# Patient Record
Sex: Male | Born: 1935
Health system: Southern US, Community
[De-identification: ages and names within clinical notes are randomized; demographics above are authoritative.]

## PROBLEM LIST (undated history)

## (undated) DIAGNOSIS — T7840XA Allergy, unspecified, initial encounter: Secondary | ICD-10-CM

## (undated) DIAGNOSIS — M1990 Unspecified osteoarthritis, unspecified site: Secondary | ICD-10-CM

## (undated) DIAGNOSIS — N400 Enlarged prostate without lower urinary tract symptoms: Secondary | ICD-10-CM

## (undated) DIAGNOSIS — I82409 Acute embolism and thrombosis of unspecified deep veins of unspecified lower extremity: Secondary | ICD-10-CM

## (undated) DIAGNOSIS — I10 Essential (primary) hypertension: Secondary | ICD-10-CM

## (undated) DIAGNOSIS — G709 Myoneural disorder, unspecified: Secondary | ICD-10-CM

## (undated) DIAGNOSIS — N529 Male erectile dysfunction, unspecified: Secondary | ICD-10-CM

## (undated) DIAGNOSIS — IMO0001 Reserved for inherently not codable concepts without codable children: Secondary | ICD-10-CM

## (undated) DIAGNOSIS — Z860100 Personal history of colon polyps, unspecified: Secondary | ICD-10-CM

## (undated) DIAGNOSIS — E213 Hyperparathyroidism, unspecified: Secondary | ICD-10-CM

## (undated) DIAGNOSIS — K5790 Diverticulosis of intestine, part unspecified, without perforation or abscess without bleeding: Secondary | ICD-10-CM

## (undated) DIAGNOSIS — K219 Gastro-esophageal reflux disease without esophagitis: Secondary | ICD-10-CM

## (undated) DIAGNOSIS — E785 Hyperlipidemia, unspecified: Secondary | ICD-10-CM

## (undated) DIAGNOSIS — I2699 Other pulmonary embolism without acute cor pulmonale: Secondary | ICD-10-CM

## (undated) DIAGNOSIS — R51 Headache: Secondary | ICD-10-CM

## (undated) HISTORY — DX: Unspecified osteoarthritis, unspecified site: M19.90

## (undated) HISTORY — DX: Allergy, unspecified, initial encounter: T78.40XA

## (undated) HISTORY — DX: Other pulmonary embolism without acute cor pulmonale: I26.99

## (undated) HISTORY — DX: Male erectile dysfunction, unspecified: N52.9

## (undated) HISTORY — PX: BASAL CELL CARCINOMA EXCISION: SHX1214

## (undated) HISTORY — DX: Essential (primary) hypertension: I10

## (undated) HISTORY — DX: Gastro-esophageal reflux disease without esophagitis: K21.9

## (undated) HISTORY — DX: Personal history of colonic polyps: Z86.010

## (undated) HISTORY — DX: Hypercalcemia: E83.52

## (undated) HISTORY — PX: HERNIA REPAIR: SHX51

## (undated) HISTORY — DX: Benign prostatic hyperplasia without lower urinary tract symptoms: N40.0

## (undated) HISTORY — PX: TONSILLECTOMY: SUR1361

## (undated) HISTORY — DX: Hyperlipidemia, unspecified: E78.5

## (undated) HISTORY — DX: Personal history of colon polyps, unspecified: Z86.0100

## (undated) HISTORY — PX: JOINT REPLACEMENT: SHX530

## (undated) HISTORY — DX: Headache: R51

## (undated) HISTORY — DX: Acute embolism and thrombosis of unspecified deep veins of unspecified lower extremity: I82.409

## (undated) HISTORY — PX: OTHER SURGICAL HISTORY: SHX169

## (undated) HISTORY — PX: KNEE ARTHROSCOPY: SUR90

## (undated) HISTORY — DX: Diverticulosis of intestine, part unspecified, without perforation or abscess without bleeding: K57.90

## (undated) HISTORY — DX: Reserved for inherently not codable concepts without codable children: IMO0001

---

## 1999-05-23 ENCOUNTER — Encounter (INDEPENDENT_AMBULATORY_CARE_PROVIDER_SITE_OTHER): Payer: Self-pay | Admitting: Specialist

## 1999-05-23 ENCOUNTER — Other Ambulatory Visit: Admission: RE | Admit: 1999-05-23 | Discharge: 1999-05-23 | Payer: Self-pay | Admitting: Gastroenterology

## 1999-05-31 ENCOUNTER — Observation Stay (HOSPITAL_COMMUNITY): Admission: EM | Admit: 1999-05-31 | Discharge: 1999-06-01 | Payer: Self-pay | Admitting: Emergency Medicine

## 2000-06-06 ENCOUNTER — Other Ambulatory Visit: Admission: RE | Admit: 2000-06-06 | Discharge: 2000-06-06 | Payer: Self-pay | Admitting: Gastroenterology

## 2000-06-06 ENCOUNTER — Encounter (INDEPENDENT_AMBULATORY_CARE_PROVIDER_SITE_OTHER): Payer: Self-pay | Admitting: Specialist

## 2002-08-04 ENCOUNTER — Encounter: Payer: Self-pay | Admitting: Orthopedic Surgery

## 2002-08-10 ENCOUNTER — Inpatient Hospital Stay (HOSPITAL_COMMUNITY): Admission: RE | Admit: 2002-08-10 | Discharge: 2002-08-14 | Payer: Self-pay | Admitting: Orthopedic Surgery

## 2004-01-26 ENCOUNTER — Encounter: Admission: RE | Admit: 2004-01-26 | Discharge: 2004-01-26 | Payer: Self-pay | Admitting: General Surgery

## 2004-01-31 ENCOUNTER — Encounter (INDEPENDENT_AMBULATORY_CARE_PROVIDER_SITE_OTHER): Payer: Self-pay | Admitting: *Deleted

## 2004-01-31 ENCOUNTER — Ambulatory Visit (HOSPITAL_BASED_OUTPATIENT_CLINIC_OR_DEPARTMENT_OTHER): Admission: RE | Admit: 2004-01-31 | Discharge: 2004-01-31 | Payer: Self-pay | Admitting: General Surgery

## 2004-01-31 ENCOUNTER — Ambulatory Visit (HOSPITAL_COMMUNITY): Admission: RE | Admit: 2004-01-31 | Discharge: 2004-01-31 | Payer: Self-pay | Admitting: General Surgery

## 2004-10-26 ENCOUNTER — Ambulatory Visit: Payer: Self-pay | Admitting: Family Medicine

## 2004-12-01 ENCOUNTER — Ambulatory Visit: Payer: Self-pay | Admitting: Family Medicine

## 2005-05-04 ENCOUNTER — Ambulatory Visit: Payer: Self-pay | Admitting: Family Medicine

## 2005-05-11 ENCOUNTER — Ambulatory Visit: Payer: Self-pay | Admitting: Family Medicine

## 2005-11-30 ENCOUNTER — Ambulatory Visit: Payer: Self-pay | Admitting: Family Medicine

## 2006-07-22 ENCOUNTER — Ambulatory Visit: Payer: Self-pay | Admitting: Family Medicine

## 2006-10-08 ENCOUNTER — Ambulatory Visit: Payer: Self-pay | Admitting: Family Medicine

## 2006-12-12 ENCOUNTER — Ambulatory Visit: Payer: Self-pay | Admitting: Family Medicine

## 2006-12-12 LAB — CONVERTED CEMR LAB
Albumin: 4.1 g/dL (ref 3.5–5.2)
Alkaline Phosphatase: 44 units/L (ref 39–117)
BUN: 14 mg/dL (ref 6–23)
Cholesterol: 170 mg/dL (ref 0–200)
GFR calc Af Amer: 95 mL/min
HDL: 57.4 mg/dL (ref >39.0)
MCHC: 33.7 g/dL (ref 30.0–36.0)
PSA: 2.22 ng/mL (ref 0.10–4.00)
Platelets: 243 10*3/uL (ref 150–400)
Potassium: 4.1 meq/L (ref 3.5–5.1)
RBC: 4.1 M/uL — ABNORMAL LOW (ref 4.22–5.81)
RDW: 13 % (ref 11.5–14.6)
Sodium: 139 meq/L (ref 135–145)
TSH: 1.85 microintl units/mL (ref 0.35–5.50)
Total CHOL/HDL Ratio: 3
Triglycerides: 58 mg/dL (ref 0–149)

## 2006-12-23 ENCOUNTER — Ambulatory Visit: Payer: Self-pay | Admitting: Family Medicine

## 2007-01-28 ENCOUNTER — Ambulatory Visit: Payer: Self-pay | Admitting: Family Medicine

## 2007-10-01 ENCOUNTER — Ambulatory Visit: Payer: Self-pay | Admitting: Family Medicine

## 2007-10-01 DIAGNOSIS — E785 Hyperlipidemia, unspecified: Secondary | ICD-10-CM

## 2007-10-01 DIAGNOSIS — Z860100 Personal history of colon polyps, unspecified: Secondary | ICD-10-CM | POA: Insufficient documentation

## 2007-10-01 DIAGNOSIS — J309 Allergic rhinitis, unspecified: Secondary | ICD-10-CM | POA: Insufficient documentation

## 2007-10-01 DIAGNOSIS — K219 Gastro-esophageal reflux disease without esophagitis: Secondary | ICD-10-CM | POA: Insufficient documentation

## 2007-10-01 DIAGNOSIS — I10 Essential (primary) hypertension: Secondary | ICD-10-CM | POA: Insufficient documentation

## 2007-12-24 ENCOUNTER — Ambulatory Visit: Payer: Self-pay | Admitting: Family Medicine

## 2007-12-24 DIAGNOSIS — F528 Other sexual dysfunction not due to a substance or known physiological condition: Secondary | ICD-10-CM

## 2007-12-24 DIAGNOSIS — M1990 Unspecified osteoarthritis, unspecified site: Secondary | ICD-10-CM | POA: Insufficient documentation

## 2007-12-24 DIAGNOSIS — N138 Other obstructive and reflux uropathy: Secondary | ICD-10-CM

## 2007-12-24 DIAGNOSIS — N401 Enlarged prostate with lower urinary tract symptoms: Secondary | ICD-10-CM

## 2007-12-26 LAB — CONVERTED CEMR LAB
AST: 37 units/L (ref 0–37)
Albumin: 4.3 g/dL (ref 3.5–5.2)
Basophils Absolute: 0 10*3/uL (ref 0.0–0.1)
Bilirubin, Direct: 0.3 mg/dL (ref 0.0–0.3)
Chloride: 102 meq/L (ref 96–112)
Cholesterol: 185 mg/dL (ref 0–200)
Eosinophils Absolute: 0.1 10*3/uL (ref 0.0–0.6)
Eosinophils Relative: 1.1 % (ref 0.0–5.0)
GFR calc Af Amer: 94 mL/min
GFR calc non Af Amer: 78 mL/min
Glucose, Bld: 110 mg/dL — ABNORMAL HIGH (ref 70–99)
HCT: 38.9 % — ABNORMAL LOW (ref 39.0–52.0)
Lymphocytes Relative: 21 % (ref 12.0–46.0)
MCHC: 34.5 g/dL (ref 30.0–36.0)
MCV: 96.4 fL (ref 78.0–100.0)
Neutro Abs: 3.5 10*3/uL (ref 1.4–7.7)
Neutrophils Relative %: 64.6 % (ref 43.0–77.0)
PSA: 2.2 ng/mL (ref 0.10–4.00)
Platelets: 245 10*3/uL (ref 150–400)
RBC: 4.03 M/uL — ABNORMAL LOW (ref 4.22–5.81)
Sodium: 140 meq/L (ref 135–145)
TSH: 2.03 microintl units/mL (ref 0.35–5.50)
Total CHOL/HDL Ratio: 2.8
Triglycerides: 35 mg/dL (ref 0–149)

## 2008-01-27 ENCOUNTER — Telehealth: Payer: Self-pay | Admitting: Family Medicine

## 2008-04-22 ENCOUNTER — Ambulatory Visit: Payer: Self-pay | Admitting: Family Medicine

## 2008-07-07 ENCOUNTER — Ambulatory Visit: Payer: Self-pay | Admitting: Family Medicine

## 2008-07-07 DIAGNOSIS — K5732 Diverticulitis of large intestine without perforation or abscess without bleeding: Secondary | ICD-10-CM

## 2008-07-09 ENCOUNTER — Telehealth: Payer: Self-pay | Admitting: Gastroenterology

## 2008-07-09 ENCOUNTER — Ambulatory Visit: Payer: Self-pay | Admitting: Gastroenterology

## 2008-07-09 ENCOUNTER — Telehealth (INDEPENDENT_AMBULATORY_CARE_PROVIDER_SITE_OTHER): Payer: Self-pay | Admitting: *Deleted

## 2008-07-09 ENCOUNTER — Encounter: Payer: Self-pay | Admitting: Gastroenterology

## 2008-07-23 ENCOUNTER — Encounter: Payer: Self-pay | Admitting: Gastroenterology

## 2008-07-23 ENCOUNTER — Encounter: Payer: Self-pay | Admitting: Family Medicine

## 2008-07-23 ENCOUNTER — Ambulatory Visit: Payer: Self-pay | Admitting: Gastroenterology

## 2008-07-23 LAB — HM COLONOSCOPY

## 2008-07-27 ENCOUNTER — Encounter: Payer: Self-pay | Admitting: Gastroenterology

## 2008-09-24 ENCOUNTER — Ambulatory Visit: Payer: Self-pay | Admitting: Family Medicine

## 2008-10-18 ENCOUNTER — Ambulatory Visit: Payer: Self-pay | Admitting: Family Medicine

## 2008-10-29 ENCOUNTER — Encounter: Payer: Self-pay | Admitting: Family Medicine

## 2008-10-29 ENCOUNTER — Ambulatory Visit: Payer: Self-pay

## 2008-11-02 ENCOUNTER — Telehealth: Payer: Self-pay | Admitting: Family Medicine

## 2008-11-03 ENCOUNTER — Telehealth: Payer: Self-pay | Admitting: Family Medicine

## 2008-11-15 ENCOUNTER — Ambulatory Visit: Payer: Self-pay | Admitting: Gastroenterology

## 2008-11-30 ENCOUNTER — Telehealth: Payer: Self-pay | Admitting: Gastroenterology

## 2008-12-01 ENCOUNTER — Ambulatory Visit: Payer: Self-pay | Admitting: Gastroenterology

## 2008-12-16 ENCOUNTER — Ambulatory Visit: Payer: Self-pay | Admitting: Family Medicine

## 2008-12-20 LAB — CONVERTED CEMR LAB
Albumin: 4.1 g/dL (ref 3.5–5.2)
Alkaline Phosphatase: 49 units/L (ref 39–117)
Bilirubin, Direct: 0.1 mg/dL (ref 0.0–0.3)
Calcium: 10.9 mg/dL — ABNORMAL HIGH (ref 8.4–10.5)
Cholesterol: 158 mg/dL (ref 0–200)
Eosinophils Absolute: 0.1 10*3/uL (ref 0.0–0.7)
GFR calc Af Amer: 94 mL/min
GFR calc non Af Amer: 78 mL/min
Glucose, Bld: 97 mg/dL (ref 70–99)
HCT: 39.1 % (ref 39.0–52.0)
HDL: 67.5 mg/dL (ref >39.0)
Hemoglobin: 13.3 g/dL (ref 13.0–17.0)
MCHC: 34 g/dL (ref 30.0–36.0)
MCV: 99.5 fL (ref 78.0–100.0)
Monocytes Absolute: 0.6 10*3/uL (ref 0.1–1.0)
Monocytes Relative: 13.5 % — ABNORMAL HIGH (ref 3.0–12.0)
Neutro Abs: 2.9 10*3/uL (ref 1.4–7.7)
Platelets: 229 10*3/uL (ref 150–400)
Potassium: 4.6 meq/L (ref 3.5–5.1)
RDW: 13 % (ref 11.5–14.6)
Sodium: 141 meq/L (ref 135–145)
TSH: 1.45 microintl units/mL (ref 0.35–5.50)
Total Protein: 7 g/dL (ref 6.0–8.3)
Triglycerides: 29 mg/dL (ref 0–149)

## 2009-01-26 ENCOUNTER — Ambulatory Visit: Payer: Self-pay | Admitting: Gastroenterology

## 2009-07-01 ENCOUNTER — Encounter: Admission: RE | Admit: 2009-07-01 | Discharge: 2009-07-01 | Payer: Self-pay | Admitting: Orthopedic Surgery

## 2009-12-19 ENCOUNTER — Ambulatory Visit: Payer: Self-pay | Admitting: Family Medicine

## 2009-12-23 LAB — CONVERTED CEMR LAB
BUN: 14 mg/dL (ref 6–23)
Basophils Absolute: 0 10*3/uL (ref 0.0–0.1)
Chloride: 100 meq/L (ref 96–112)
Cholesterol: 176 mg/dL (ref 0–200)
Creatinine, Ser: 0.8 mg/dL (ref 0.4–1.5)
Eosinophils Absolute: 0.1 10*3/uL (ref 0.0–0.7)
Glucose, Bld: 107 mg/dL — ABNORMAL HIGH (ref 70–99)
HCT: 40 % (ref 39.0–52.0)
Lymphs Abs: 1 10*3/uL (ref 0.7–4.0)
MCV: 102.1 fL — ABNORMAL HIGH (ref 78.0–100.0)
Monocytes Absolute: 0.6 10*3/uL (ref 0.1–1.0)
Neutrophils Relative %: 63.9 % (ref 43.0–77.0)
PSA: 2.26 ng/mL (ref 0.10–4.00)
Platelets: 222 10*3/uL (ref 150.0–400.0)
Potassium: 3.9 meq/L (ref 3.5–5.1)
RDW: 13.3 % (ref 11.5–14.6)
TSH: 1.48 microintl units/mL (ref 0.35–5.50)
Total Bilirubin: 1.3 mg/dL — ABNORMAL HIGH (ref 0.3–1.2)
Triglycerides: 42 mg/dL (ref 0.0–149.0)
VLDL: 8.4 mg/dL (ref 0.0–40.0)

## 2009-12-26 ENCOUNTER — Ambulatory Visit: Payer: Self-pay | Admitting: Family Medicine

## 2009-12-27 LAB — CONVERTED CEMR LAB: Calcium Ionized: 1.5 mmol/L — ABNORMAL HIGH (ref 1.12–1.32)

## 2010-01-06 ENCOUNTER — Ambulatory Visit: Payer: Self-pay | Admitting: Endocrinology

## 2010-01-06 LAB — CONVERTED CEMR LAB: PTH: 63.4 pg/mL (ref 14.0–72.0)

## 2010-01-11 ENCOUNTER — Ambulatory Visit: Payer: Self-pay | Admitting: Endocrinology

## 2010-01-11 ENCOUNTER — Telehealth (INDEPENDENT_AMBULATORY_CARE_PROVIDER_SITE_OTHER): Payer: Self-pay | Admitting: *Deleted

## 2010-01-11 LAB — CONVERTED CEMR LAB
Alpha-1-Globulin: 6.6 % — ABNORMAL HIGH (ref 2.9–4.9)
Alpha-2-Globulin: 10.6 % (ref 7.1–11.8)
Gamma Globulin: 11.9 % (ref 11.1–18.8)

## 2010-01-12 LAB — CONVERTED CEMR LAB
Bilirubin Urine: NEGATIVE
Hemoglobin, Urine: NEGATIVE
Total Protein, Urine: NEGATIVE mg/dL
Urine Glucose: NEGATIVE mg/dL
Urobilinogen, UA: 0.2 (ref 0.0–1.0)

## 2010-01-19 ENCOUNTER — Telehealth: Payer: Self-pay | Admitting: Endocrinology

## 2010-02-10 ENCOUNTER — Telehealth: Payer: Self-pay | Admitting: Endocrinology

## 2010-02-10 ENCOUNTER — Ambulatory Visit: Payer: Self-pay | Admitting: Endocrinology

## 2010-02-22 ENCOUNTER — Ambulatory Visit (HOSPITAL_BASED_OUTPATIENT_CLINIC_OR_DEPARTMENT_OTHER): Admission: RE | Admit: 2010-02-22 | Discharge: 2010-02-22 | Payer: Self-pay | Admitting: Orthopedic Surgery

## 2010-03-22 ENCOUNTER — Ambulatory Visit: Payer: Self-pay | Admitting: Endocrinology

## 2010-03-22 DIAGNOSIS — E210 Primary hyperparathyroidism: Secondary | ICD-10-CM

## 2010-03-22 LAB — CONVERTED CEMR LAB
BUN: 15 mg/dL (ref 6–23)
Calcium: 10.5 mg/dL (ref 8.4–10.5)
Creatinine, Ser: 0.8 mg/dL (ref 0.4–1.5)
GFR calc non Af Amer: 100.34 mL/min (ref >60)
Glucose, Bld: 99 mg/dL (ref 70–99)

## 2010-05-17 ENCOUNTER — Ambulatory Visit: Payer: Self-pay | Admitting: Endocrinology

## 2010-05-17 DIAGNOSIS — R609 Edema, unspecified: Secondary | ICD-10-CM

## 2010-05-17 LAB — CONVERTED CEMR LAB
BUN: 16 mg/dL (ref 6–23)
CO2: 29 meq/L (ref 19–32)
Calcium, Total (PTH): 10.4 mg/dL (ref 8.4–10.5)
Chloride: 106 meq/L (ref 96–112)
Creatinine, Ser: 0.8 mg/dL (ref 0.4–1.5)
Glucose, Bld: 97 mg/dL (ref 70–99)
Pro B Natriuretic peptide (BNP): 145.1 pg/mL — ABNORMAL HIGH (ref 0.0–100.0)

## 2010-05-22 ENCOUNTER — Ambulatory Visit: Payer: Self-pay | Admitting: Family Medicine

## 2010-05-22 ENCOUNTER — Telehealth: Payer: Self-pay | Admitting: Endocrinology

## 2010-11-08 ENCOUNTER — Ambulatory Visit: Payer: Self-pay | Admitting: Endocrinology

## 2010-11-16 ENCOUNTER — Encounter: Payer: Self-pay | Admitting: Family Medicine

## 2010-11-21 ENCOUNTER — Telehealth: Payer: Self-pay | Admitting: Family Medicine

## 2010-12-06 ENCOUNTER — Ambulatory Visit: Admit: 2010-12-06 | Payer: Self-pay | Admitting: Endocrinology

## 2010-12-22 ENCOUNTER — Encounter: Payer: Self-pay | Admitting: Family Medicine

## 2010-12-23 ENCOUNTER — Encounter: Payer: Self-pay | Admitting: Family Medicine

## 2010-12-26 ENCOUNTER — Telehealth: Payer: Self-pay | Admitting: Family Medicine

## 2010-12-26 NOTE — Progress Notes (Signed)
Summary: PT messge?  Phone Note Call from Patient Call back at Home Phone [phone]   Caller: Patient Summary of Call: Pt called stating that he was able to retrieve one message on PT left by SAE about parathyroid and calcium but there was another message that had to do with him increasing his Lasix that he was unable to hear. Pt is requestind MD advisement again on increase of Lasix and why. Pt aware MD out of office yntil 05/30/2010 and will wait for his return and reply. Initial call taken by: Crissie Sickles, [place],  May 22, 2010 10:10 AM  Follow-up for Phone Call        it is a borderline call as to whether to double furosemide.  i will go along with whichever you say. Follow-up by: Donavan Foil MD,  May 30, 2010 12:33 PM  Additional Follow-up for Phone Call Additional follow up Details #1::        Pt states he contacted PCP and was advised that lab work came back good and he will continue on current dose of Furosemide. Additional Follow-up by: Crissie Sickles, CMA,  May 30, 2010 4:45 PM

## 2010-12-26 NOTE — Progress Notes (Signed)
Summary: FYI  Phone Note Call from Patient Call back at Home Phone [phone]   Caller: Patient Summary of Call: pt called to inform MD that he forgot to mention at [place] today that he has had several cancerous growths removed from skin over the last few years. This is in regards to calcium levels that was discussed today. Initial call taken by: Crissie Sickles, [place],  February 10, 2010 2:33 PM  Follow-up for Phone Call        these are not the types of cancer that increase the calcuim level, so you don't need to worry about that. Follow-up by: Donavan Foil MD,  February 10, 2010 3:51 PM  Additional Follow-up for Phone Call Additional follow up Details #1::        pt informed Additional Follow-up by: Crissie Sickles, [place],  February 10, 2010 3:57 PM

## 2010-12-26 NOTE — Miscellaneous (Signed)
Summary: [facility]   Imported By: Bubba Hales 02/17/2010 10:12:26  _____________________________________________________________________  External Attachment:    Type:   Image     Comment:   External Document

## 2010-12-26 NOTE — Assessment & Plan Note (Signed)
Summary: FU / NWS  #   Vital Signs:  Patient profile:   77 year old [name] Height:      70 inches Weight:      226.50 pounds BMI:     32.62 O2 Sat:      95 % on Room air Temp:     98.6 degrees F rectal Pulse rate:   85 / minute BP sitting:   140 / 82  (left arm) Cuff size:   regular  Vitals Entered By: Crissie Sickles, CMA (March 22, 2010 8:50 AM)  O2 Flow:  Room air CC: Follow up/ DBD   Referring Provider:  Alysia Penna MD Primary Provider:  Alysia Penna, MD  CC:  Follow up/ DBD.  History of Present Illness: the status of at least 3 ongoing medical problems is addressed today: hyperparathyroidism:  the etiology (primary vs secondary) has been unclear).   htn:  pt says he has had episodic htn surrounding his recent knee surgery.   ? of vitamin-d deficiency:  pt says there is a small amount of vitamin-d in his multivitamin.  pt states he feels well in general.  Current Medications (verified): 1)  Lipitor 10 Mg Tabs (Atorvastatin Calcium) .Marland Kitchen.. 1 By Mouth Once Daily 2)  Metamucil 30.9 %  Powd (Psyllium) .Marland Kitchen.. 1 Tbsp Daily 3)  Aspir-Low 81 Mg Tbec (Aspirin) .Marland Kitchen.. 1 Once Daily Pc 4)  Cardura 4 Mg Tabs (Doxazosin Mesylate) .Marland Kitchen.. 1 By Mouth Once Daily 5)  Flonase 50 Mcg/act Susp (Fluticasone Propionate) .... 2 Spr Once Daily As Needed 6)  Avodart 0.5 Mg Caps (Dutasteride) .Marland Kitchen.. 1 By Mouth Every Other Day 7)  Celebrex 200 Mg  Caps (Celecoxib) .... Two Times A Day 8)  Protonix 40 Mg  Tbec (Pantoprazole Sodium) .... Two Times A Day 9)  Multivitamins   Tabs (Multiple Vitamin) .Marland Kitchen.. 1 By Mouth Once Daily 10)  Docusate Sodium 100 Mg Caps (Docusate Sodium) .... Once Daily  Allergies (verified): No Known Drug Allergies  Past History:  Past Medical History: Last updated: 12/19/2009  normal cardiac stress test David-4-09  DIVERTICULITIS, COLON (ICD-562.11) SCIATICA (ICD-724.3) ERECTILE DYSFUNCTION (ICD-302.72) BENIGN PROSTATIC HYPERTROPHY (ICD-600.00) OSTEOARTHRITIS  (ICD-715.90 HEADACHE (ICD-784.0) COLONIC POLYPS, HX OF (ICD-V12.72) HYPERTENSION (ICD-401.9) HYPERLIPIDEMIA (ICD-272.4) GERD (ICD-530.81) ALLERGIC RHINITIS (ICD-477.9)    Review of Systems  The patient denies dyspnea on exertion.         denies cramps, and and myalgias.  he attributes polyuria to prostate probs  Physical Exam  General:  obese.  no distress  Extremities:  1+ right pedal edema and 1+ left pedal edema.   Additional Exam:  Parathyroid Hormone  [H]  77.8 pg/mL                  14.0-72.0 Calcium              [H]  10.6 mg/dL                  8.4-10.5  Vitamin D              30 ng/mL      Impression & Recommendations:  Problem # 1:  HYPERTENSION (ICD-401.9) well-controlled  Problem # 2:  vitamin-d deficiency well-controlled  Problem # 3:  PRIMARY HYPERPARATHYROIDISM (ICD-252.01) this dx is more clear now, but hypercalcemia is improved.  Medications Added to Medication List This Visit: 1)  Furosemide 20 Mg Tabs (Furosemide) .Marland Kitchen.. 1 once daily  Other Orders: T-Vitamin D (25-Hydroxy) [id number]) T-Parathyroid Hormone, Intact w/  Calcium [id number]) TLB-BMP (Basic Metabolic Panel-BMET) (99991111) Est. Patient Level IV [id number])  Patient Instructions: 1)  tests are being ordered for you today.  a few days after the test(s), please call [phone] to hear your test results. 2)  pending the test results, please start furosemide 20 mg once daily. 3)  Please schedule a follow-up appointment in 2 months. 4)  (update: i left message on phone-tree:  rx as we discussed) Prescriptions: FUROSEMIDE 20 MG TABS (FUROSEMIDE) 1 once daily  #90 x 3   Entered and Authorized by:   Donavan Foil MD   Signed by:   Donavan Foil MD on 03/22/2010   Method used:   Electronically to        [facility]  [phone]* (retail)       [place], [place]  [address]       Ph: [phone] or [phone]       Fax: [phone]   [id number] FUROSEMIDE 20 MG TABS (FUROSEMIDE) 1 once daily  #30 x 11   Entered and Authorized by:   Donavan Foil MD   Signed by:   Donavan Foil MD on 03/22/2010   Method used:   Electronically to        [facility]  [phone]* (retail)       [address] [place],   [address]       Ph: [phone] or [phone]       Fax: [phone]   RxID:   [id number]

## 2010-12-26 NOTE — Assessment & Plan Note (Signed)
Summary: 2 MTH FU  STC   Vital Signs:  Patient profile:   77 year old male Height:      70 inches (177.80 cm) Weight:      227.0 pounds (103.18 kg) O2 Sat:      96 % on Room air Temp:     98.3 degrees F (36.83 degrees C) oral Pulse rate:   86 / minute BP sitting:   122 / 72  (left arm) Cuff size:   large  Vitals Entered By: Tomma Lightning (May 17, 2010 9:16 AM)  O2 Flow:  Room air CC: 2 month follow-up Is Patient Diabetic? No Pain Assessment Patient in pain? no        Referring Provider:  Alysia Penna MD Primary Provider:  Alysia Penna, MD  CC:  2 month follow-up.  History of Present Illness: the status of at least 3 ongoing medical problems is addressed today: edema:  is slightly better. htn:  pt says bp varies, but is mostly well-controlled. primary hyperparathyroidism:  denies cramps.    Current Medications (verified): 1)  Lipitor 10 Mg Tabs (Atorvastatin Calcium) .Marland Kitchen.. 1 By Mouth Once Daily 2)  Metamucil 30.9 %  Powd (Psyllium) .Marland Kitchen.. 1 Tbsp Daily 3)  Aspir-Low 81 Mg Tbec (Aspirin) .Marland Kitchen.. 1 Once Daily Pc 4)  Cardura 4 Mg Tabs (Doxazosin Mesylate) .Marland Kitchen.. 1 By Mouth Once Daily 5)  Flonase 50 Mcg/act Susp (Fluticasone Propionate) .... 2 Spr Once Daily As Needed 6)  Avodart 0.5 Mg Caps (Dutasteride) .Marland Kitchen.. 1 By Mouth Every Other Day 7)  Celebrex 200 Mg  Caps (Celecoxib) .... Two Times A Day 8)  Protonix 40 Mg  Tbec (Pantoprazole Sodium) .... Two Times A Day 9)  Multivitamins   Tabs (Multiple Vitamin) .Marland Kitchen.. 1 By Mouth Once Daily 10)  Docusate Sodium 100 Mg Caps (Docusate Sodium) .... Once Daily 11)  Furosemide 20 Mg Tabs (Furosemide) .Marland Kitchen.. 1 Once Daily  Allergies (verified): No Known Drug Allergies  Past History:  Past Medical History: Last updated: 12/19/2009  normal cardiac stress test 10-29-08  DIVERTICULITIS, COLON (ICD-562.11) SCIATICA (ICD-724.3) ERECTILE DYSFUNCTION (ICD-302.72) BENIGN PROSTATIC HYPERTROPHY (ICD-600.00) OSTEOARTHRITIS (ICD-715.90 HEADACHE  (ICD-784.0) COLONIC POLYPS, HX OF (ICD-V12.72) HYPERTENSION (ICD-401.9) HYPERLIPIDEMIA (ICD-272.4) GERD (ICD-530.81) ALLERGIC RHINITIS (ICD-477.9)    Review of Systems  The patient denies weight loss and weight gain.    Physical Exam  General:  normal appearance.   Extremities:  trace right pedal edema and trace left pedal edema.   Additional Exam:  B-Type Natriuetic Peptide       [H]  145.1 pg/mL                 0.0-100.0    Sodium                    139 mEq/L                   135-145   Potassium                 4.4 mEq/L                   3.5-5.1   Chloride                  106 mEq/L                   96-112   Carbon Dioxide            29 mEq/L  19-32   Glucose                   97 mg/dL                    70-99   BUN                       16 mg/dL                    6-23   Creatinine                0.8 mg/dL                   0.4-1.5   Calcium                   10.3 mg/dL  Parathyroid Hormone       70.0 pg/mL                  14.0-72.0   Calcium                   10.4 mg/dL      Impression & Recommendations:  Problem # 1:  EDEMA (ICD-782.3) bnp suggests some fluid overload  Problem # 2:  PRIMARY HYPERPARATHYROIDISM (ICD-252.01) Assessment: Improved  Problem # 3:  HYPERTENSION (ICD-401.9) Assessment: Improved  Other Orders: T-Parathyroid Hormone, Intact w/ Calcium [id number]) TLB-BMP (Basic Metabolic Panel-BMET) (99991111) TLB-BNP (B-Natriuretic Peptide) (83880-BNPR) Est. Patient Level IV [id number])  Patient Instructions: 1)  blood tests are being ordered for you today.  please call [phone] to hear your test results. 2)  pending the test results, please continue the same medications for now 3)  Please schedule a follow-up appointment in 6 months. 4)  (update: i left message on phone-tree:  i offered to double lasix).

## 2010-12-26 NOTE — Assessment & Plan Note (Signed)
Summary: concerned about change of BP meds/dm   Vital Signs:  Patient profile:   77 year old male Weight:      223 pounds BP sitting:   130 / 76  (left arm) Cuff size:   regular  Vitals Entered By: Chipper Oman, RN (May 22, 2010 3:35 PM) CC: Was told by Dr Loanne Drilling to f/u with PCP about BP and Lasix.   History of Present Illness: Here to discussa recent med change. I had sent him to see Dr. Loanne Drilling about some mild hypercalcemia, and after a workup he felt this may be resulting from the HCTZ the pt. had been taking. Dr. Loanne Drilling suggested he switch to Furosemide, and in fact the calium levels have normalized. [name] feels great on this, and his BP has been stable at home.   Allergies: No Known Drug Allergies  Past History:  Past Medical History:  normal cardiac stress test 10-29-08  DIVERTICULITIS, COLON (ICD-562.11) SCIATICA (ICD-724.3) ERECTILE DYSFUNCTION (ICD-302.72) BENIGN PROSTATIC HYPERTROPHY (ICD-600.00) OSTEOARTHRITIS (ICD-715.90 HEADACHE (ICD-784.0) COLONIC POLYPS, HX OF (ICD-V12.72) HYPERTENSION (ICD-401.9) HYPERLIPIDEMIA (ICD-272.4) GERD (ICD-530.81) ALLERGIC RHINITIS (ICD-477.9) hypercalcemia, sees Dr. Loanne Drilling  Past Surgical History: Arthroscopy rt knee 1997 right knee replacement 2003 per Dr. Wynelle Link Basal cell Carcinomas removed face and back, sees Dr. Sherrye Payor, had MOHS per Dr. Sarajane Jews colonoscopy 07-23-08  per Dr. Deatra Ina, benign polyps, repeat in 5 yrs Tonsillectomy Inguinal herniorrhaphy, right per Dr. Rise Patience 3-05 EGD 12-01-08 per Dr. Deatra Ina, normal arthroscopy left knee march 2011 per Dr. Wynelle Link  Review of Systems  The patient denies anorexia, fever, weight loss, weight gain, vision loss, decreased hearing, hoarseness, chest pain, syncope, dyspnea on exertion, peripheral edema, prolonged cough, headaches, hemoptysis, abdominal pain, melena, hematochezia, severe indigestion/heartburn, hematuria, incontinence, genital sores, muscle weakness, suspicious  skin lesions, transient blindness, difficulty walking, depression, unusual weight change, abnormal bleeding, enlarged lymph nodes, angioedema, breast masses, and testicular masses.    Physical Exam  General:  Well-developed,well-nourished,in no acute distress; alert,appropriate and cooperative throughout examination Neck:  No deformities, masses, or tenderness noted. Lungs:  Normal respiratory effort, chest expands symmetrically. Lungs are clear to auscultation, no crackles or wheezes. Heart:  Normal rate and regular rhythm. S1 and S2 normal without gallop, murmur, click, rub or other extra sounds.   Impression & Recommendations:  Problem # 1:  HYPERCALCEMIA (ICD-275.42)  Problem # 2:  EDEMA (W8805310.3)  His updated medication list for this problem includes:    Furosemide 20 Mg Tabs (Furosemide) .Marland Kitchen... 1 once daily  Problem # 3:  HYPERTENSION (ICD-401.9)  His updated medication list for this problem includes:    Cardura 4 Mg Tabs (Doxazosin mesylate) .Marland Kitchen... 1 by mouth once daily    Furosemide 20 Mg Tabs (Furosemide) .Marland Kitchen... 1 once daily  Complete Medication List: 1)  Lipitor 10 Mg Tabs (Atorvastatin calcium) .Marland Kitchen.. 1 by mouth once daily 2)  Metamucil 30.9 % Powd (Psyllium) .Marland Kitchen.. 1 tbsp daily 3)  Aspir-low 81 Mg Tbec (Aspirin) .Marland Kitchen.. 1 once daily pc 4)  Cardura 4 Mg Tabs (Doxazosin mesylate) .Marland Kitchen.. 1 by mouth once daily 5)  Flonase 50 Mcg/act Susp (Fluticasone propionate) .... 2 spr once daily as needed 6)  Avodart 0.5 Mg Caps (Dutasteride) .Marland Kitchen.. 1 by mouth every other day 7)  Celebrex 200 Mg Caps (Celecoxib) .... Two times a day 8)  Protonix 40 Mg Tbec (Pantoprazole sodium) .... Two times a day 9)  Multivitamins Tabs (Multiple vitamin) .Marland Kitchen.. 1 by mouth once daily 10)  Docusate Sodium 100 Mg Caps (Docusate sodium) .Marland KitchenMarland KitchenMarland Kitchen  Once daily 11)  Furosemide 20 Mg Tabs (Furosemide) .Marland Kitchen.. 1 once daily  Patient Instructions: 1)  stay on the current regimen.  2)  Please schedule a follow-up appointment in 3  months .

## 2010-12-26 NOTE — Assessment & Plan Note (Signed)
Summary: emp-will fast//ccm   Vital Signs:  Patient profile:   77 year old male Height:      70 inches Weight:      225 pounds BMI:     32.40 Temp:     98.5 degrees F oral Pulse rate:   109 / minute BP sitting:   134 / 76  (left arm) Cuff size:   large  Vitals Entered By: Townsend Roger, [place] (December 19, 2009 8:45 AM) CC: cpx, fasting   History of Present Illness: 77 yr old male for cpx. He feels good in general. He is having some pain in the left knee and is using Celebrex for it. He may need an arthroscopy, but Dr. Wynelle Link is putting this off as long as possible. He has early cataractsm but is not to the point of requiring surgery yet. Still walks for exercise.   Allergies (verified): No Known Drug Allergies  Past History:  Past Medical History:  normal cardiac stress test 10-29-08  DIVERTICULITIS, COLON (ICD-562.11) SCIATICA (ICD-724.3) ERECTILE DYSFUNCTION (ICD-302.72) BENIGN PROSTATIC HYPERTROPHY (ICD-600.00) OSTEOARTHRITIS (ICD-715.90 HEADACHE (ICD-784.0) COLONIC POLYPS, HX OF (ICD-V12.72) HYPERTENSION (ICD-401.9) HYPERLIPIDEMIA (ICD-272.4) GERD (ICD-530.81) ALLERGIC RHINITIS (ICD-477.9)    Past Surgical History: Arthroscopy rt knee 1997 right knee replacement 2003 per Dr. Wynelle Link Basal cell Carcinomas removed face and back, sees Dr. Sherrye Payor, had MOHS per Dr. Sarajane Jews colonoscopy 07-23-08  per Dr. Deatra Ina, benign polyps, repeat in 5 yrs Tonsillectomy Inguinal herniorrhaphy, right per Dr. Rise Patience 3-05 EGD 12-01-08 per Dr. Deatra Ina, normal  Family History: Reviewed history from 11/08/2008 and no changes required. Family History of Arthritis Family History of Colon CA 1st degree relative <60 Family History of Heart Disease: Mother  Social History: Reviewed history from 11/15/2008 and no changes required. Retired Married Former Smoker Alcohol use-yes Drug use-no Daily Caffeine Use  Review of Systems  The patient denies anorexia, fever, weight loss,  weight gain, vision loss, decreased hearing, hoarseness, chest pain, syncope, dyspnea on exertion, peripheral edema, prolonged cough, headaches, hemoptysis, abdominal pain, melena, hematochezia, severe indigestion/heartburn, hematuria, incontinence, genital sores, muscle weakness, suspicious skin lesions, transient blindness, difficulty walking, depression, unusual weight change, abnormal bleeding, enlarged lymph nodes, angioedema, breast masses, and testicular masses.    Physical Exam  General:  overweight-appearing.   Head:  Normocephalic and atraumatic without obvious abnormalities. No apparent alopecia or balding. Eyes:  No corneal or conjunctival inflammation noted. EOMI. Perrla. Funduscopic exam benign, without hemorrhages, exudates or papilledema. Vision grossly normal. Ears:  External ear exam shows no significant lesions or deformities.  Otoscopic examination reveals clear canals, tympanic membranes are intact bilaterally without bulging, retraction, inflammation or discharge. Hearing is grossly normal bilaterally. Nose:  External nasal examination shows no deformity or inflammation. Nasal mucosa are pink and moist without lesions or exudates. Mouth:  Oral mucosa and oropharynx without lesions or exudates.  Teeth in good repair. Neck:  No deformities, masses, or tenderness noted. Chest Wall:  No deformities, masses, tenderness or gynecomastia noted. Lungs:  Normal respiratory effort, chest expands symmetrically. Lungs are clear to auscultation, no crackles or wheezes. Heart:  Normal rate and regular rhythm. S1 and S2 normal without gallop, murmur, click, rub or other extra sounds. EKG is at his baseline with RBBB and occasional PVCs Abdomen:  Bowel sounds positive,abdomen soft and non-tender without masses, organomegaly or hernias noted. Rectal:  No external abnormalities noted. Normal sphincter tone. No rectal masses or tenderness. Heme  neg.  Genitalia:  Testes bilaterally descended  without nodularity, tenderness or masses. No  scrotal masses or lesions. No penis lesions or urethral discharge. Prostate:  no nodules, no asymmetry, no induration, and 2+ enlarged.   Msk:  No deformity or scoliosis noted of thoracic or lumbar spine.   Pulses:  R and L carotid,radial,femoral,dorsalis pedis and posterior tibial pulses are full and equal bilaterally Extremities:  No clubbing, cyanosis, edema, or deformity noted with normal full range of motion of all joints.   Neurologic:  No cranial nerve deficits noted. Station and gait are normal. Plantar reflexes are down-going bilaterally. DTRs are symmetrical throughout. Sensory, motor and coordinative functions appear intact. Skin:  Intact without suspicious lesions or rashes Cervical Nodes:  No lymphadenopathy noted Axillary Nodes:  No palpable lymphadenopathy Inguinal Nodes:  No significant adenopathy Psych:  Cognition and judgment appear intact. Alert and cooperative with normal attention span and concentration. No apparent delusions, illusions, hallucinations   Impression & Recommendations:  Problem # 1:  HEALTH MAINTENANCE EXAM (ICD-V70.0)  Orders: UA Dipstick w/o Micro (automated)  (81003) Hemoccult Guaiac-1 spec.(in office) (82270) EKG w/ Interpretation (93000) Venipuncture [id number]) TLB-Lipid Panel (80061-LIPID) TLB-BMP (Basic Metabolic Panel-BMET) (99991111) TLB-CBC Platelet - w/Differential (85025-CBCD) TLB-Hepatic/Liver Function Pnl (80076-HEPATIC) TLB-TSH (Thyroid Stimulating Hormone) (84443-TSH) TLB-PSA (Prostate Specific Antigen) (84153-PSA)  Complete Medication List: 1)  Lipitor 10 Mg Tabs (Atorvastatin calcium) .Marland Kitchen.. 1 by mouth once daily 2)  Metamucil 30.9 % Powd (Psyllium) .Marland Kitchen.. 1 tbsp daily 3)  Aspir-low 81 Mg Tbec (Aspirin) .Marland Kitchen.. 1 once daily pc 4)  Cardura 4 Mg Tabs (Doxazosin mesylate) .Marland Kitchen.. 1 by mouth once daily 5)  Flonase 50 Mcg/act Susp (Fluticasone propionate) .... 2 spr once daily as needed 6)   Avodart 0.5 Mg Caps (Dutasteride) .Marland Kitchen.. 1 by mouth every other day 7)  Celebrex 200 Mg Caps (Celecoxib) .... Two times a day 8)  Hydrochlorothiazide 25 Mg Tabs (Hydrochlorothiazide) .... Take 1 tab by mouth every morning 9)  Protonix 40 Mg Tbec (Pantoprazole sodium) .... Two times a day 10)  Multivitamins Tabs (Multiple vitamin) .Marland Kitchen.. 1 by mouth once daily  Patient Instructions: 1)  Get labs Prescriptions: PROTONIX 40 MG  TBEC (PANTOPRAZOLE SODIUM) two times a day  #180 x 3   Entered and Authorized by:   Laurey Morale MD   Signed by:   Laurey Morale MD on 12/19/2009   Method used:   Print then Give to Patient   RxID:   [id number] HYDROCHLOROTHIAZIDE 25 MG  TABS (HYDROCHLOROTHIAZIDE) Take 1 tab by mouth every morning  #90 x 3   Entered and Authorized by:   Laurey Morale MD   Signed by:   Laurey Morale MD on 12/19/2009   Method used:   Print then Give to Patient   RxID:   [id number] CELEBREX 200 MG  CAPS (CELECOXIB) two times a day  #180 x 3   Entered and Authorized by:   Laurey Morale MD   Signed by:   Laurey Morale MD on 12/19/2009   Method used:   Print then Give to Patient   RxID:   [id number] AVODART 0.5 MG CAPS (DUTASTERIDE) 1 by mouth every other day  #90 x 3   Entered and Authorized by:   Laurey Morale MD   Signed by:   Laurey Morale MD on 12/19/2009   Method used:   Print then Give to Patient   RxID:   [id number] FLONASE 50 MCG/ACT SUSP (FLUTICASONE PROPIONATE) 2 spr once daily as needed  #90 x 3   Entered and Authorized  by:   Laurey Morale MD   Signed by:   Laurey Morale MD on 12/19/2009   Method used:   Print then Give to Patient   RxID:   [id number] CARDURA 4 MG TABS (DOXAZOSIN MESYLATE) 1 by mouth once daily  #90 x 3   Entered and Authorized by:   Laurey Morale MD   Signed by:   Laurey Morale MD on 12/19/2009   Method used:   Print then Give to Patient   RxID:   [id number] LIPITOR 10 MG TABS (ATORVASTATIN CALCIUM) 1 by mouth once  daily  #90 x 3   Entered and Authorized by:   Laurey Morale MD   Signed by:   Laurey Morale MD on 12/19/2009   Method used:   Print then Give to Patient   RxID:   [id number]     Immunization History:  Pneumovax Immunization History:    Pneumovax:  historical (11/26/2006)  Influenza Immunization History:    Influenza:  historical (09/08/2009)    Appended Document: emp-will fast//ccm  Laboratory Results   Urine Tests    Routine Urinalysis   Color: yellow Appearance: Clear Glucose: negative   (Normal Range: Negative) Bilirubin: negative   (Normal Range: Negative) Ketone: negative   (Normal Range: Negative) Spec. Gravity: 1.015   (Normal Range: 1.003-1.035) Blood: negative   (Normal Range: Negative) pH: 8.0   (Normal Range: 5.0-8.0) Protein: trace   (Normal Range: Negative) Urobilinogen: 0.2   (Normal Range: 0-1) Nitrite: negative   (Normal Range: Negative) Leukocyte Esterace: negative   (Normal Range: Negative)    Comments: Joyce Gross  December 19, 2009 1:34 PM

## 2010-12-26 NOTE — Progress Notes (Signed)
Summary: follow up on appt.  Phone Note Call from Patient   Caller: Patient Call For: [name] Summary of Call: pt called  stating that he listened to phone tree and got the understanding that Dr. Loanne Drilling did not want him to keep his 02-10-2010 appt. pt wanted to know if he still needed to come.   Pt was informed that he should still keep his appt./vg Initial call taken by: Doralee Albino,  January 19, 2010 3:38 PM

## 2010-12-26 NOTE — Progress Notes (Signed)
Summary: CHEST XRAY REQUEST  Phone Note Call from Patient Call back at Home Phone [phone]   Caller: Patient Call For: Laurey Morale MD Summary of Call: Pt came into the office stating that Dr. Loanne Drilling wanted for him to have a chest x-ray done , but there was not an order in the system. Pt is waiting in the office.  Initial call taken by: Shawnie Pons,  January 11, 2010 9:43 AM  Follow-up for Phone Call        per MD update to Pt Instruction 01/06/2010. Orders entered for CXR 2-view and scheduled in [facility] Follow-up by: Crissie Sickles, [place],  January 11, 2010 9:51 AM

## 2010-12-26 NOTE — Assessment & Plan Note (Signed)
Summary: 2 MTH FU---STC   Vital Signs:  Patient profile:   77 year old male Height:      70 inches (177.80 cm) Weight:      228.13 pounds (103.70 kg) O2 Sat:      95 % on Room air Temp:     98.4 degrees F (36.89 degrees C) oral Pulse rate:   100 / minute BP sitting:   126 / 68  (left arm) Cuff size:   large  Vitals Entered By: Gardenia Phlegm RMA (February 10, 2010 9:02 AM)  O2 Flow:  Room air CC: follow-up visit/ CF Is Patient Diabetic? No   Referring Provider:  Alysia Penna MD Primary Provider:  Alysia Penna, MD  CC:  follow-up visit/ CF.  History of Present Illness: pt states he feels well in general.  he takes bp meds as rx'ed  Current Medications (verified): 1)  Lipitor 10 Mg Tabs (Atorvastatin Calcium) .Marland Kitchen.. 1 By Mouth Once Daily 2)  Metamucil 30.9 %  Powd (Psyllium) .Marland Kitchen.. 1 Tbsp Daily 3)  Aspir-Low 81 Mg Tbec (Aspirin) .Marland Kitchen.. 1 Once Daily Pc 4)  Cardura 4 Mg Tabs (Doxazosin Mesylate) .Marland Kitchen.. 1 By Mouth Once Daily 5)  Flonase 50 Mcg/act Susp (Fluticasone Propionate) .... 2 Spr Once Daily As Needed 6)  Avodart 0.5 Mg Caps (Dutasteride) .Marland Kitchen.. 1 By Mouth Every Other Day 7)  Celebrex 200 Mg  Caps (Celecoxib) .... Two Times A Day 8)  Protonix 40 Mg  Tbec (Pantoprazole Sodium) .... Two Times A Day 9)  Multivitamins   Tabs (Multiple Vitamin) .Marland Kitchen.. 1 By Mouth Once Daily 10)  Docusate Sodium 100 Mg Caps (Docusate Sodium) .... Once Daily 11)  Hydrochlorothiazide 12.5 Mg Caps (Hydrochlorothiazide) .Marland Kitchen.. 1 Qd  Allergies (verified): No Known Drug Allergies  Past History:  Past Medical History: Last updated: 12/19/2009  normal cardiac stress test 10-29-08  DIVERTICULITIS, COLON (ICD-562.11) SCIATICA (ICD-724.3) ERECTILE DYSFUNCTION (ICD-302.72) BENIGN PROSTATIC HYPERTROPHY (ICD-600.00) OSTEOARTHRITIS (ICD-715.90 HEADACHE (ICD-784.0) COLONIC POLYPS, HX OF (ICD-V12.72) HYPERTENSION (ICD-401.9) HYPERLIPIDEMIA (ICD-272.4) GERD (ICD-530.81) ALLERGIC RHINITIS (ICD-477.9)    Review  of Systems  The patient denies dyspnea on exertion.    Physical Exam  General:  normal appearance.   Extremities:  trace right pedal edema and trace left pedal edema.   Additional Exam:  Parathyroid Hormone       59.7 pg/mL                  14.0-72.0 Calcium              [H]  11.1 mg/dL     Impression & Recommendations:  Problem # 1:  HYPERCALCEMIA (ICD-275.42) Assessment Improved etiology is still uncertain  Problem # 2:  HYPERTENSION (ICD-401.9) it appears he could tolerate a reduction of his hctz  Other Orders: T-Parathyroid Hormone, Intact w/ Calcium [id number]) Est. Patient Level III [id number])  Patient Instructions: 1)  tests are being ordered for you today.  a few days after the test(s), please call [phone] to hear your test results. 2)  pending the test results, please continue the same medications for now. 3)  (update: i left message on phone-tree:  stop hctz.  return 4-6 weeks).

## 2010-12-26 NOTE — Assessment & Plan Note (Signed)
Summary: new endo/hyperkalcemia/medicare/frye/cd   Vital Signs:  Patient profile:   77 year old male Height:      70 inches (177.80 cm) Weight:      226.13 pounds (102.79 kg) O2 Sat:      97 % on Room air Temp:     97.3 degrees F (36.28 degrees C) oral Pulse rate:   69 / minute BP sitting:   122 / 76  (left arm) Cuff size:   large  Vitals Entered By: Gardenia Phlegm CMA (January 06, 2010 1:29 PM)  O2 Flow:  Room air CC: New Endo: Hypercalcemia/ CF Is Patient Diabetic? No   Referring Provider:  Alysia Penna MD Primary Provider:  Alysia Penna, MD  CC:  New Endo: Hypercalcemia/ CF.  History of Present Illness: pt has several years h/o hypercalcemia.  he has never had surgery for this.  symptomatically, pt states few mos of mild depression in the context of the death of his brother.  no associated numbness.  he denies osteoporosis or urolithiasis.  Current Medications (verified): 1)  Lipitor 10 Mg Tabs (Atorvastatin Calcium) .Marland Kitchen.. 1 By Mouth Once Daily 2)  Metamucil 30.9 %  Powd (Psyllium) .Marland Kitchen.. 1 Tbsp Daily 3)  Aspir-Low 81 Mg Tbec (Aspirin) .Marland Kitchen.. 1 Once Daily Pc 4)  Cardura 4 Mg Tabs (Doxazosin Mesylate) .Marland Kitchen.. 1 By Mouth Once Daily 5)  Flonase 50 Mcg/act Susp (Fluticasone Propionate) .... 2 Spr Once Daily As Needed 6)  Avodart 0.5 Mg Caps (Dutasteride) .Marland Kitchen.. 1 By Mouth Every Other Day 7)  Celebrex 200 Mg  Caps (Celecoxib) .... Two Times A Day 8)  Hydrochlorothiazide 25 Mg  Tabs (Hydrochlorothiazide) .... Take 1 Tab By Mouth Every Morning 9)  Protonix 40 Mg  Tbec (Pantoprazole Sodium) .... Two Times A Day 10)  Multivitamins   Tabs (Multiple Vitamin) .Marland Kitchen.. 1 By Mouth Once Daily 11)  Docusate Sodium 100 Mg Caps (Docusate Sodium) .... Once Daily  Allergies (verified): No Known Drug Allergies  Past History:  Past Medical History: Last updated: 12/19/2009  normal cardiac stress test 10-29-08  DIVERTICULITIS, COLON (ICD-562.11) SCIATICA (ICD-724.3) ERECTILE DYSFUNCTION  (ICD-302.72) BENIGN PROSTATIC HYPERTROPHY (ICD-600.00) OSTEOARTHRITIS (ICD-715.90 HEADACHE (ICD-784.0) COLONIC POLYPS, HX OF (ICD-V12.72) HYPERTENSION (ICD-401.9) HYPERLIPIDEMIA (ICD-272.4) GERD (ICD-530.81) ALLERGIC RHINITIS (ICD-477.9)    Review of Systems       denies galactorrhea, hematuria, memory loss, erectile dysfunction, seizures, abdominal pain, muscle weakness, urinary frequency, hypoglycemia, visual loss, sob, and n/v.  he has lost weight, due to his efforts. he has arthralgias--sees orthopedics.  he sees dermatology for a chronic rash   Physical Exam  Additional Exam:  Parathyroid Hormone       63.4 pg/mL                  14.0-72.0   Calcium              [H]  11.5 mg/dL     Impression & Recommendations:  Problem # 1:  HYPERCALCEMIA (Q000111Q) uncertain etiology (pth is equovical)  Problem # 2:  HYPERTENSION (ICD-401.9) hctz may contribute to #1  Problem # 3:  depression may be exac by #1  Medications Added to Medication List This Visit: 1)  Docusate Sodium 100 Mg Caps (Docusate sodium) .... Once daily 2)  Hydrochlorothiazide 12.5 Mg Caps (Hydrochlorothiazide) .Marland Kitchen.. 1 qd  Other Orders: T-Parathyroid Hormone, Intact w/ Calcium [id number]) T-2 View CXR ([id number]) Consultation Level IV [id number])  Patient Instructions: 1)  reduce hctz to 12.5 mg once daily 2)  tests are  being ordered for you today.  a few days after the test(s), please call [phone] to hear your test results. 3)  return 2 months. 4)  (update: i left message on phone-tree:  come in for cxr, spep, and ua (dip and micro)  all 275.42). 5)  (update: i left another message on phone-tree:  rx as we discussed. we'll need to follow the ca++ on lower hctz to get dx over time) Prescriptions: HYDROCHLOROTHIAZIDE 12.5 MG CAPS (HYDROCHLOROTHIAZIDE) 1 qd  #90 x 11   Entered and Authorized by:   Donavan Foil MD   Signed by:   Donavan Foil MD on 01/06/2010   Method used:   Electronically to        [facility]  [phone]* (retail)       [place], [place]  [id number]       Ph: [phone] or [phone]       Fax: [phone]   RxID:   [id number]

## 2010-12-28 NOTE — Assessment & Plan Note (Signed)
Summary: 6 MTH FU  D/T  STC   Vital Signs:  Patient profile:   77 year old male Height:      70 inches (177.80 cm) Weight:      220.50 pounds (100.23 kg) BMI:     31.75 O2 Sat:      97 % on Room air Temp:     98.9 degrees F (37.17 degrees C) oral Pulse rate:   80 / minute Pulse rhythm:   regular BP sitting:   126 / 80  (left arm) Cuff size:   regular  Vitals Entered By: Rebeca Alert CMA Deborra Medina) (November 08, 2010 9:25 AM)  O2 Flow:  Room air CC: Follow-up visit/aj Is Patient Diabetic? No Comments Pt had Zostavax in 2005   Referring Provider:  Alysia Penna MD Primary Provider:  Alysia Penna, MD  CC:  Follow-up visit/aj.  History of Present Illness: pt has h/o htn and primary hyperparathyroidism.  ca++ normalized with discontinuation of the hctz.  edema is resolved.    Current Medications (verified): 1)  Lipitor 10 Mg Tabs (Atorvastatin Calcium) .Marland Kitchen.. 1 By Mouth Once Daily 2)  Metamucil 30.9 %  Powd (Psyllium) .Marland Kitchen.. 1 Tbsp Daily 3)  Aspir-Low 81 Mg Tbec (Aspirin) .Marland Kitchen.. 1 Once Daily Pc 4)  Cardura 4 Mg Tabs (Doxazosin Mesylate) .Marland Kitchen.. 1 By Mouth Once Daily 5)  Flonase 50 Mcg/act Susp (Fluticasone Propionate) .... 2 Spr Once Daily As Needed 6)  Avodart 0.5 Mg Caps (Dutasteride) .Marland Kitchen.. 1 By Mouth Every Other Day 7)  Celebrex 200 Mg  Caps (Celecoxib) .... Two Times A Day 8)  Protonix 40 Mg  Tbec (Pantoprazole Sodium) .... Two Times A Day 9)  Multivitamins   Tabs (Multiple Vitamin) .Marland Kitchen.. 1 By Mouth Once Daily 10)  Docusate Sodium 100 Mg Caps (Docusate Sodium) .... Once Daily 11)  Furosemide 20 Mg Tabs (Furosemide) .Marland Kitchen.. 1 Once Daily  Allergies (verified): No Known Drug Allergies  Past History:  Past Medical History: Last updated: 05/22/2010  normal cardiac stress test 10-29-08  DIVERTICULITIS, COLON (ICD-562.11) SCIATICA (ICD-724.3) ERECTILE DYSFUNCTION (ICD-302.72) BENIGN PROSTATIC HYPERTROPHY (ICD-600.00) OSTEOARTHRITIS (ICD-715.90 HEADACHE (ICD-784.0) COLONIC POLYPS, HX  OF (ICD-V12.72) HYPERTENSION (ICD-401.9) HYPERLIPIDEMIA (ICD-272.4) GERD (ICD-530.81) ALLERGIC RHINITIS (ICD-477.9) hypercalcemia, sees Dr. Loanne Drilling  Review of Systems  The patient denies dyspnea on exertion.    Physical Exam  Extremities:  trace right pedal edema and trace left pedal edema.     Impression & Recommendations:  Problem # 1:  PRIMARY HYPERPARATHYROIDISM (ICD-252.01) Assessment Unchanged  Problem # 2:  EDEMA (W8805310.3) Assessment: Improved  Problem # 3:  HYPERTENSION (ICD-401.9) well-controlled, off hctz  Other Orders: Est. Patient Level III [id number])  Patient Instructions: 1)  blood tests are being ordered for you today.  please call [phone] to hear your test results.  i'll add this blood test on to your january physical. 2)  pending the test results, please continue the same medications for now 3)  Please schedule a follow-up appointment in 1 year. 4)  with time, the high blood calcium may come back.   5)  (pt wants to do his parathyroid hormone when he has labs with dr Sarajane Jews Mary Sella, 2012) 252.01   Orders Added: 1)  Est. Patient Level III [id number]   Immunization History:  Tetanus/Td Immunization History:    Tetanus/Td:  historical (11/26/2004)  Influenza Immunization History:    Influenza:  historical (08/26/2010)   Immunization History:  Tetanus/Td Immunization History:    Tetanus/Td:  Historical (11/26/2004)  Influenza Immunization History:  Influenza:  Historical (08/26/2010)

## 2010-12-28 NOTE — Letter (Signed)
Summary: [facility]   Imported By: Laural Benes 12/06/2010 13:11:22  _____________________________________________________________________  External Attachment:    Type:   Image     Comment:   External Document

## 2010-12-28 NOTE — Progress Notes (Signed)
Summary: [facility]   Phone Note Refill Request Message from:  Fax from Pharmacy on November 21, 2010 2:15 PM  Refills Requested: Medication #1:  LIPITOR 10 MG TABS 1 by mouth once daily  Medication #2:  PROTONIX 40 MG  TBEC two times a day  Medication #3:  CARDURA 4 MG TABS 1 by mouth once daily  Medication #4:  CELEBREX 200 MG  CAPS two times a day [facility]   Method Requested: Fax to [facility] Initial call taken by: Cay Schillings LPN,  December 27, 624THL 2:15 PM    Prescriptions: PROTONIX 40 MG  TBEC (PANTOPRAZOLE SODIUM) two times a day  #180 x 1   Entered by:   Cay Schillings LPN   Authorized by:   Laurey Morale MD   Signed by:   Cay Schillings LPN on 624THL   Method used:   Historical   [id number] CELEBREX 200 MG  CAPS (CELECOXIB) two times a day  #180 x 1   Entered by:   Cay Schillings LPN   Authorized by:   Laurey Morale MD   Signed by:   Cay Schillings LPN on 624THL   Method used:   Historical   [id number] CARDURA 4 MG TABS (DOXAZOSIN MESYLATE) 1 by mouth once daily  #90 x 1   Entered by:   Cay Schillings LPN   Authorized by:   Laurey Morale MD   Signed by:   Cay Schillings LPN on 624THL   Method used:   Historical   [id number] LIPITOR 10 MG TABS (ATORVASTATIN CALCIUM) 1 by mouth once daily  #90 x 1   Entered by:   Cay Schillings LPN   Authorized by:   Laurey Morale MD   Signed by:   Cay Schillings LPN on 624THL   Method used:   Historical   [id number]  faxed back to [facility]   KIK

## 2011-01-03 ENCOUNTER — Ambulatory Visit (INDEPENDENT_AMBULATORY_CARE_PROVIDER_SITE_OTHER): Payer: Medicare Other | Admitting: Family Medicine

## 2011-01-03 ENCOUNTER — Encounter: Payer: Self-pay | Admitting: Family Medicine

## 2011-01-03 DIAGNOSIS — I10 Essential (primary) hypertension: Secondary | ICD-10-CM

## 2011-01-03 DIAGNOSIS — E785 Hyperlipidemia, unspecified: Secondary | ICD-10-CM

## 2011-01-03 DIAGNOSIS — M1990 Unspecified osteoarthritis, unspecified site: Secondary | ICD-10-CM

## 2011-01-03 DIAGNOSIS — J309 Allergic rhinitis, unspecified: Secondary | ICD-10-CM

## 2011-01-03 DIAGNOSIS — K219 Gastro-esophageal reflux disease without esophagitis: Secondary | ICD-10-CM

## 2011-01-03 DIAGNOSIS — N139 Obstructive and reflux uropathy, unspecified: Secondary | ICD-10-CM

## 2011-01-03 DIAGNOSIS — E210 Primary hyperparathyroidism: Secondary | ICD-10-CM

## 2011-01-03 DIAGNOSIS — N401 Enlarged prostate with lower urinary tract symptoms: Secondary | ICD-10-CM

## 2011-01-03 LAB — HEPATIC FUNCTION PANEL
ALT: 25 U/L (ref 0–53)
AST: 26 U/L (ref 0–37)
Bilirubin, Direct: 0.1 mg/dL (ref 0.0–0.3)
Total Protein: 6.7 g/dL (ref 6.0–8.3)

## 2011-01-03 LAB — CBC WITH DIFFERENTIAL/PLATELET
Basophils Absolute: 0 10*3/uL (ref 0.0–0.1)
Eosinophils Absolute: 0.1 10*3/uL (ref 0.0–0.7)
HCT: 38.3 % — ABNORMAL LOW (ref 39.0–52.0)
Lymphs Abs: 1 10*3/uL (ref 0.7–4.0)
MCHC: 33.9 g/dL (ref 30.0–36.0)
Monocytes Absolute: 0.7 10*3/uL (ref 0.1–1.0)
Monocytes Relative: 11.1 % (ref 3.0–12.0)
Neutro Abs: 4.8 10*3/uL (ref 1.4–7.7)
Platelets: 211 10*3/uL (ref 150.0–400.0)
RDW: 14.1 % (ref 11.5–14.6)

## 2011-01-03 LAB — BASIC METABOLIC PANEL
CO2: 29 mEq/L (ref 19–32)
Calcium: 10.9 mg/dL — ABNORMAL HIGH (ref 8.4–10.5)
Creatinine, Ser: 0.8 mg/dL (ref 0.4–1.5)
Glucose, Bld: 101 mg/dL — ABNORMAL HIGH (ref 70–99)

## 2011-01-03 LAB — LIPID PANEL
HDL: 75.8 mg/dL (ref >39.00)
Triglycerides: 36 mg/dL (ref 0.0–149.0)

## 2011-01-03 LAB — POCT URINALYSIS DIPSTICK
Spec Grav, UA: 1.02
Urobilinogen, UA: 1
pH, UA: 7

## 2011-01-03 LAB — TSH: TSH: 1.91 u[IU]/mL (ref 0.35–5.50)

## 2011-01-03 MED ORDER — CELECOXIB 200 MG PO CAPS: 200.0000 mg | ORAL_CAPSULE | Freq: Two times a day (BID) | ORAL | Status: DC

## 2011-01-03 MED ORDER — DUTASTERIDE 0.5 MG PO CAPS: 0.5000 mg | ORAL_CAPSULE | ORAL | Status: DC

## 2011-01-03 MED ORDER — FUROSEMIDE 20 MG PO TABS: 20.0000 mg | ORAL_TABLET | Freq: Every day | ORAL | Status: DC

## 2011-01-03 MED ORDER — PANTOPRAZOLE SODIUM 40 MG PO TBEC: 40.0000 mg | DELAYED_RELEASE_TABLET | Freq: Two times a day (BID) | ORAL | Status: DC

## 2011-01-03 MED ORDER — ATORVASTATIN CALCIUM 10 MG PO TABS: 10.0000 mg | ORAL_TABLET | Freq: Every day | ORAL | Status: DC

## 2011-01-03 MED ORDER — DOCUSATE SODIUM 100 MG PO CAPS: 100.0000 mg | ORAL_CAPSULE | Freq: Every day | ORAL | Status: DC

## 2011-01-03 MED ORDER — DOXAZOSIN MESYLATE 4 MG PO TABS: 4.0000 mg | ORAL_TABLET | Freq: Every day | ORAL | Status: DC

## 2011-01-03 MED ORDER — FLUTICASONE PROPIONATE 50 MCG/ACT NA SUSP: 2.0000 | Freq: Every day | NASAL | Status: DC

## 2011-01-03 NOTE — Progress Notes (Signed)
Summary: rx flonase and avodart  Phone Note From Pharmacy   Caller: [facility] Summary of Call: refill flonase  50 micrograms and  avodart  Initial call taken by: Levora Angel, RN,  December 26, 2010 3:18 PM  Follow-up for Phone Call        please refill both for one year  Follow-up by: Laurey Morale MD,  December 27, 2010 8:55 AM  Additional Follow-up for Phone Call Additional follow up Details #1::        faxed to [facility]  Additional Follow-up by: Levora Angel, RN,  December 27, 2010 10:08 AM    Prescriptions: Asencion Islam 50 MCG/ACT SUSP (FLUTICASONE PROPIONATE) 2 spr once daily as needed  #3 x 3   Entered by:   Levora Angel, RN   Authorized by:   Laurey Morale MD   Signed by:   Levora Angel, RN on 12/27/2010   Method used:   Printed then faxed to ...       [facility] (retail)             , [place]         Ph: [phone]       Fax: [phone]   [id number] AVODART 0.5 MG CAPS (DUTASTERIDE) 1 by mouth every other day  #90 x 3   Entered by:   Levora Angel, RN   Authorized by:   Laurey Morale MD   Signed by:   Levora Angel, RN on 12/27/2010   Method used:   Printed then faxed to ...       [facility] (retail)             , [place]         Ph: [phone]       Fax: [phone]   RxID:   [id number]

## 2011-01-03 NOTE — Progress Notes (Signed)
  Subjective:    Patient ID: [name], male    DOB: 1933-11-14, 77 y.o.   MRN: [id number]  HPI 77 yr old male for a cpx. He feels well in general. Still plays a lot of golf.    Review of Systems  Constitutional: Negative.  Negative for fever, diaphoresis, activity change, appetite change, fatigue and unexpected weight change.  HENT: Negative.  Negative for hearing loss, ear pain, nosebleeds, congestion, sore throat, trouble swallowing, neck pain, neck stiffness, voice change and tinnitus.   Eyes: Negative.  Negative for photophobia, pain, discharge, redness and visual disturbance.  Respiratory: Negative.  Negative for apnea, cough, choking, chest tightness, shortness of breath, wheezing and stridor.   Cardiovascular: Negative.  Negative for chest pain, palpitations and leg swelling.  Gastrointestinal: Negative.  Negative for nausea, vomiting, abdominal pain, diarrhea, constipation, blood in stool, abdominal distention and rectal pain.  Genitourinary: Negative.  Negative for dysuria, urgency, frequency, flank pain, scrotal swelling, enuresis, difficulty urinating and testicular pain.  Musculoskeletal: Negative.  Negative for myalgias, back pain, joint swelling, arthralgias and gait problem.  Skin: Negative.  Negative for color change, pallor, rash and wound.  Neurological: Negative.  Negative for dizziness, tremors, seizures, syncope, speech difficulty, weakness, light-headedness, numbness and headaches.  Hematological: Negative.  Negative for adenopathy. Does not bruise/bleed easily.  Psychiatric/Behavioral: Negative.  Negative for hallucinations, behavioral problems, confusion, sleep disturbance, dysphoric mood and agitation. The patient is not nervous/anxious.        Objective:   Physical Exam  Constitutional: He appears well-developed and well-nourished. No distress.  HENT:  Head: Normocephalic and atraumatic.  Right Ear: External ear normal.  Left Ear: External ear normal.    Nose: Nose normal.  Mouth/Throat: Oropharynx is clear and moist. No oropharyngeal exudate.  Eyes: Conjunctivae and EOM are normal. Pupils are equal, round, and reactive to light. Right eye exhibits no discharge. Left eye exhibits no discharge. No scleral icterus.  Neck: Normal range of motion. Neck supple. No JVD present. No thyromegaly present.  Cardiovascular: Normal rate, regular rhythm, normal heart sounds and intact distal pulses.  Exam reveals no gallop and no friction rub.   No murmur heard. Pulmonary/Chest: Effort normal and breath sounds normal. No stridor. No respiratory distress. He has no wheezes. He has no rales. He exhibits no tenderness.  Abdominal: Soft. Normal appearance and bowel sounds are normal. He exhibits no distension, no abdominal bruit, no ascites and no mass. There is no hepatosplenomegaly. There is no tenderness. There is no rigidity, no rebound and no guarding. No hernia.  Genitourinary: Rectum normal, prostate normal and penis normal. Guaiac negative stool. No penile tenderness.  Musculoskeletal: Normal range of motion. He exhibits no edema and no tenderness.  Lymphadenopathy:    He has no cervical adenopathy.  Neurological: He is alert. He has normal reflexes. No cranial nerve deficit. He exhibits normal muscle tone. Coordination normal.  Skin: Skin is warm and dry. No rash noted. He is not diaphoretic. No erythema. No pallor.  Psychiatric: He has a normal mood and affect. His behavior is normal. Judgment and thought content normal.          Assessment & Plan:  Get fasting labs today.

## 2011-01-04 ENCOUNTER — Telehealth: Payer: Self-pay

## 2011-01-04 NOTE — Telephone Encounter (Signed)
Message copied by Edgar Frisk on Thu Jan 04, 2011  8:13 AM ------      Message from: Jacquenette Shone      Created: Wed Jan 03, 2011  5:14 PM       normal

## 2011-01-04 NOTE — Telephone Encounter (Signed)
Notified pt of results 

## 2011-01-05 ENCOUNTER — Telehealth: Payer: Self-pay

## 2011-01-05 LAB — PTH, INTACT AND CALCIUM: PTH: 83.1 pg/mL — ABNORMAL HIGH (ref 14.0–72.0)

## 2011-01-05 NOTE — Telephone Encounter (Signed)
Pt aware of lab results 

## 2011-01-05 NOTE — Telephone Encounter (Signed)
Message copied by Edgar Frisk on Fri Jan 05, 2011  4:01 PM ------      Message from: Jacquenette Shone      Created: Fri Jan 05, 2011  1:27 PM       His PTH and calcium levels remain a bit high. Follow up with Dr. Loanne Drilling as scheduled.

## 2011-01-08 NOTE — Progress Notes (Signed)
Addended byEdgar Frisk on: 01/08/2011 09:08 AM   Modules accepted: Orders

## 2011-01-09 ENCOUNTER — Telehealth: Payer: Self-pay | Admitting: Family Medicine

## 2011-01-09 NOTE — Telephone Encounter (Signed)
Pt called back again and is waiting to get a response back to see if he could be worked in for appt witn Dr Sarajane Jews for Leg Pain.  Pls advise.

## 2011-01-09 NOTE — Telephone Encounter (Signed)
Please work him in

## 2011-01-09 NOTE — Telephone Encounter (Signed)
Wants to be worked in for right leg and ankle swelling. Please advise.

## 2011-01-09 NOTE — Telephone Encounter (Signed)
I replied early this am to have him added to my schedule, but apparently this was not done. Please call him and get more information.

## 2011-01-10 ENCOUNTER — Emergency Department (HOSPITAL_COMMUNITY): Payer: Medicare Other

## 2011-01-10 ENCOUNTER — Encounter: Payer: Self-pay | Admitting: Family Medicine

## 2011-01-10 ENCOUNTER — Ambulatory Visit (INDEPENDENT_AMBULATORY_CARE_PROVIDER_SITE_OTHER): Payer: Medicare Other | Admitting: Family Medicine

## 2011-01-10 ENCOUNTER — Encounter (INDEPENDENT_AMBULATORY_CARE_PROVIDER_SITE_OTHER): Payer: Medicare Other

## 2011-01-10 ENCOUNTER — Inpatient Hospital Stay (HOSPITAL_COMMUNITY)
Admission: EM | Admit: 2011-01-10 | Discharge: 2011-01-11 | DRG: 299 | Disposition: A | Discharge status: Home / Self Care | Payer: Medicare Other | Attending: Internal Medicine | Admitting: Internal Medicine

## 2011-01-10 VITALS — BP 120/60 | HR 92 | Temp 98.4°F | Wt 222.0 lb

## 2011-01-10 DIAGNOSIS — E7800 Pure hypercholesterolemia, unspecified: Secondary | ICD-10-CM | POA: Diagnosis present

## 2011-01-10 DIAGNOSIS — N400 Enlarged prostate without lower urinary tract symptoms: Secondary | ICD-10-CM | POA: Diagnosis present

## 2011-01-10 DIAGNOSIS — Z96659 Presence of unspecified artificial knee joint: Secondary | ICD-10-CM

## 2011-01-10 DIAGNOSIS — L539 Erythematous condition, unspecified: Secondary | ICD-10-CM

## 2011-01-10 DIAGNOSIS — I824Y9 Acute embolism and thrombosis of unspecified deep veins of unspecified proximal lower extremity: Secondary | ICD-10-CM

## 2011-01-10 DIAGNOSIS — I824Z9 Acute embolism and thrombosis of unspecified deep veins of unspecified distal lower extremity: Principal | ICD-10-CM | POA: Diagnosis present

## 2011-01-10 DIAGNOSIS — M7989 Other specified soft tissue disorders: Secondary | ICD-10-CM

## 2011-01-10 DIAGNOSIS — Z7901 Long term (current) use of anticoagulants: Secondary | ICD-10-CM

## 2011-01-10 DIAGNOSIS — R600 Localized edema: Secondary | ICD-10-CM

## 2011-01-10 DIAGNOSIS — I2699 Other pulmonary embolism without acute cor pulmonale: Secondary | ICD-10-CM | POA: Diagnosis present

## 2011-01-10 DIAGNOSIS — I10 Essential (primary) hypertension: Secondary | ICD-10-CM | POA: Diagnosis present

## 2011-01-10 DIAGNOSIS — K219 Gastro-esophageal reflux disease without esophagitis: Secondary | ICD-10-CM | POA: Diagnosis present

## 2011-01-10 DIAGNOSIS — R609 Edema, unspecified: Secondary | ICD-10-CM

## 2011-01-10 DIAGNOSIS — Z7982 Long term (current) use of aspirin: Secondary | ICD-10-CM

## 2011-01-10 DIAGNOSIS — E785 Hyperlipidemia, unspecified: Secondary | ICD-10-CM | POA: Diagnosis present

## 2011-01-10 LAB — CBC
HCT: 36.8 % — ABNORMAL LOW (ref 39.0–52.0)
MCH: 33.1 pg (ref 26.0–34.0)
MCHC: 34.2 g/dL (ref 30.0–36.0)
MCV: 96.6 fL (ref 78.0–100.0)
RDW: 13.1 % (ref 11.5–15.5)

## 2011-01-10 LAB — BASIC METABOLIC PANEL
BUN: 12 mg/dL (ref 6–23)
Calcium: 10.8 mg/dL — ABNORMAL HIGH (ref 8.4–10.5)
Creatinine, Ser: 0.83 mg/dL (ref 0.4–1.5)
GFR calc non Af Amer: >60 mL/min (ref >60)
Glucose, Bld: 95 mg/dL (ref 70–99)

## 2011-01-10 LAB — DIFFERENTIAL: Neutro Abs: 4.3 10*3/uL (ref 1.7–7.7)

## 2011-01-10 MED ORDER — IOHEXOL 300 MG/ML  SOLN
100.0000 mL | Freq: Once | INTRAMUSCULAR | Status: AC | PRN
  Administered 2011-01-10: 100 mL via INTRAVENOUS

## 2011-01-10 NOTE — Progress Notes (Signed)
  Subjective:    Patient ID: [name], male    DOB: Dec 17, 1932, 77 y.o.   MRN: [id number]  HPI Here for the sudden onset of swelling and mild discomfort in the right leg in the past 2 days. No redness. On 11-17-10 while trying to step up on a kitchen chair he slipped and fell, striking his right shin against the chair edge. He had some local swelling and bruising, but this went away over the next few weeks. Now the entire lower leg and foot are swollen. No SOB or chest pain.    Review of Systems  Constitutional: Negative.   Respiratory: Negative.   Cardiovascular: Positive for leg swelling. Negative for chest pain and palpitations.       Objective:   Physical Exam  Constitutional: He appears well-developed and well-nourished. No distress.  Cardiovascular: Normal rate, regular rhythm, normal heart sounds and intact distal pulses.  Exam reveals no gallop and no friction rub.   No murmur heard. Pulmonary/Chest: Effort normal. No respiratory distress. He has no wheezes. He has no rales. He exhibits no tenderness.  Musculoskeletal:       The right lower leg and foot are quite swollen from the knee down. No cords are felt, but he is tender in the upper calf and popliteal space. Bevelyn Buckles is positive. No erythema or warmth.           Assessment & Plan:  This is very suspicious for a DVT. We will get a venous doppler today of this leg

## 2011-01-10 NOTE — Telephone Encounter (Signed)
appt scheduled on 01/10/11

## 2011-01-11 DIAGNOSIS — I517 Cardiomegaly: Secondary | ICD-10-CM

## 2011-01-11 LAB — CBC
HCT: 35.4 % — ABNORMAL LOW (ref 39.0–52.0)
Hemoglobin: 11.9 g/dL — ABNORMAL LOW (ref 13.0–17.0)
WBC: 6.7 10*3/uL (ref 4.0–10.5)

## 2011-01-11 LAB — PHOSPHORUS: Phosphorus: 2.4 mg/dL (ref 2.3–4.6)

## 2011-01-11 LAB — CARDIAC PANEL(CRET KIN+CKTOT+MB+TROPI)
CK, MB: 6.8 ng/mL (ref 0.3–4.0)
Total CK: 287 U/L — ABNORMAL HIGH (ref 7–232)

## 2011-01-11 LAB — PROTIME-INR
INR: 1.04 (ref 0.00–1.49)
Prothrombin Time: 13.8 seconds (ref 11.6–15.2)

## 2011-01-11 LAB — COMPREHENSIVE METABOLIC PANEL
AST: 27 U/L (ref 0–37)
Albumin: 3.6 g/dL (ref 3.5–5.2)
Chloride: 104 mEq/L (ref 96–112)
Creatinine, Ser: 0.85 mg/dL (ref 0.4–1.5)
GFR calc Af Amer: >60 mL/min (ref >60)
Potassium: 4.1 mEq/L (ref 3.5–5.1)
Total Bilirubin: 0.9 mg/dL (ref 0.3–1.2)
Total Protein: 6.3 g/dL (ref 6.0–8.3)

## 2011-01-11 LAB — TROPONIN I: Troponin I: 0.01 ng/mL (ref 0.00–0.06)

## 2011-01-11 LAB — TSH: TSH: 3.621 u[IU]/mL (ref 0.350–4.500)

## 2011-01-12 ENCOUNTER — Telehealth: Payer: Self-pay | Admitting: Family Medicine

## 2011-01-12 LAB — VITAMIN D 25 HYDROXY (VIT D DEFICIENCY, FRACTURES): Vit D, 25-Hydroxy: 33 ng/mL (ref 30–89)

## 2011-01-12 LAB — PTH, INTACT AND CALCIUM: PTH: 87.1 pg/mL — ABNORMAL HIGH (ref 14.0–72.0)

## 2011-01-12 NOTE — Telephone Encounter (Signed)
Pt notified  with lab results of vit d

## 2011-01-12 NOTE — Telephone Encounter (Signed)
Message copied by Edgar Frisk on Fri Jan 12, 2011 12:45 PM ------      Message from: Jacquenette Shone      Created: Fri Jan 12, 2011 10:46 AM       Normal;

## 2011-01-12 NOTE — Telephone Encounter (Signed)
Message copied by Edgar Frisk on Fri Jan 12, 2011 12:49 PM ------      Message from: Jacquenette Shone      Created: Fri Jan 12, 2011 10:46 AM       Normal;

## 2011-01-12 NOTE — Telephone Encounter (Signed)
Pt called and said that he has embolism in his lung. Pt has sch appt for 12/2010 to discuss this with Dr Sarajane Jews.

## 2011-01-12 NOTE — Discharge Summary (Signed)
[name], [name] NO.:  [id number]  MEDICAL RECORD NO.:  [id number]           PATIENT TYPE:  I  LOCATION:  2031                         FACILITY:  [facility]  PHYSICIAN:  David Dawson, MD       DATE OF BIRTH:  June 16, 1933  DATE OF ADMISSION:  01/10/2011 DATE OF DISCHARGE:                              DISCHARGE SUMMARY   PRIMARY CARE PHYSICIAN:  [name]. Sarajane Jews, MD.  ENDOCRINOLOGIST:  David Pi. Loanne Drilling, MD.  DISCHARGE DIAGNOSES: 1. Pulmonary embolism with deep vein thrombosis. 2. History of hypercalcemia, has been seeing Dr. Loanne [name]. 3. Hypertension. 4. Benign prostatic hypertrophy. 5. Hyperlipidemia.  DISCHARGE MEDICATIONS: 1. Lovenox 100 mg subcu twice daily, to be continued until release     until January 15, 2011; to be discontinued after INR is greater     than 2.0. 2. Coumadin 5 mg p.o. daily. 3. Aspirin 81 mg p.o. q.a.m. 4. Atorvastatin 10 mg p.o. q.a.m. 5. Avodart 0.5 mg every other day. 6. Beta Carotene over-the-counter 1 tablet p.o. daily. 7. Celebrex 200 mg p.o. q.p.m. 8. Docusate 100 mg p.o. q.p.m. 9. Doxazosin 4 mg p.o. q.p.m. 10.Furosemide 20 mg p.o. daily. 11.Metamucil 1 tablespoon p.o. daily. 12.Multivitamin 1 tablet p.o. daily. 13.Pantoprazole 40 mg p.o. daily.  BRIEF ADMITTING HISTORY AND PHYSICAL:  [name] is a 77 year old gentleman with history of BPH, hypertension, hyperlipidemia, and hypercalcemia who had an injury during Christmas, about a month and half ago.  Presented initially to his PCP with right lower extremity swelling.  Lower extremity Dopplers were done, which were positive for DVT.  As a result, he was transferred to the ER and then was admitted for further evaluation.  The patient had a CT of the chest with contrast on January 10, 2011, which was positive for small volume of acute emboli in the right upper and lower lobe segmental branches.  No other acute findings in the chest.  Cardiomegaly and mild  atelectasis noted.  LABORATORY DATA:  CBC shows a white count of 6.7, hemoglobin 11.9, hematocrit 35.4, platelet count 235, and INR 1.04.  Electrolytes normal with a creatinine of 0.85.  Liver function tests normal.  Troponin negative times two.  Calcium at the time of discharge was 10.5.  TSH is 3.621.  HOSPITAL COURSE BY PROBLEMS: 1. Acute pulmonary embolism and DVT in the right lower extremity.  The     patient was started on Lovenox and Coumadin.  The patient will     continue to take Lovenox 100 mg subcu twice daily, at least until     December 31, 2010, to complete a 5-day bridging.  He was started on     Coumadin.  Lovenox can be discontinued after INR is greater than     2.0.  The patient had a 2-D echocardiogram which showed his     ejection fraction was 60%, wall motion was normal, and there were     no regional wall motion abnormalities.  Right systolic pressure was     25 mmHg.  Right ventricular systolic function was normal.  Patient     to follow up with Dr.  Alysia [name] on January 15, 2011, to get his     PT/INR checked. 2. Hypercalcemia.  Continue to follow up with Dr. Loanne [name] for further     management as an outpatient. 3. Hypertension, stable.  Continue the patient on home medications. 4. BPH.  Continue Avodart. 5. Hyperlipidemia.  Continue his statin.  DISPOSITION AND FOLLOWUP:  The patient to follow up with Dr. Loanne [name] as previously scheduled, on January 15, 2011.  The patient to follow up with his primary care physician in 1 week.  The patient to follow up with [name] office on February 20th at 3:30 p.m. for a PT/INR check.  Time spent on discharge, talking to the patient, talking to the patient's wife, giving the patient instructions, and coordinating care was 40 minutes.     David Dawson, MD     SR/MEDQ  D:  01/11/2011  T:  01/12/2011  Job:  [id number]  cc:   David Main A. Sarajane Jews, MD David Pi. Loanne Drilling, MD  Electronically Signed by [name] [name]  on  01/12/2011 04:01:30 PM

## 2011-01-15 ENCOUNTER — Ambulatory Visit: Payer: Medicare Other | Admitting: Endocrinology

## 2011-01-15 ENCOUNTER — Encounter: Payer: Self-pay | Admitting: Family Medicine

## 2011-01-15 ENCOUNTER — Ambulatory Visit (INDEPENDENT_AMBULATORY_CARE_PROVIDER_SITE_OTHER): Payer: Medicare Other | Admitting: Family Medicine

## 2011-01-15 ENCOUNTER — Other Ambulatory Visit: Payer: Medicare Other

## 2011-01-15 VITALS — BP 120/80 | HR 94 | Temp 98.0°F | Wt 216.0 lb

## 2011-01-15 DIAGNOSIS — I82409 Acute embolism and thrombosis of unspecified deep veins of unspecified lower extremity: Secondary | ICD-10-CM

## 2011-01-15 DIAGNOSIS — I2699 Other pulmonary embolism without acute cor pulmonale: Secondary | ICD-10-CM | POA: Insufficient documentation

## 2011-01-15 MED ORDER — ENOXAPARIN SODIUM 150 MG/ML ~~LOC~~ SOLN: 100.0000 mg | Freq: Two times a day (BID) | SUBCUTANEOUS | Status: DC

## 2011-01-15 MED ORDER — WARFARIN SODIUM 5 MG PO TABS: 10.0000 mg | ORAL_TABLET | Freq: Every day | ORAL | Status: DC

## 2011-01-15 NOTE — Progress Notes (Signed)
  Subjective:    Patient ID: [name], male    DOB: [birth year], 77 y.o.   MRN: [id number]  HPI Here to follow up on a hospital stay from 01-10-11 to 01-11-11 for a DVT in the right leg and multiple tiny PEs. He never had any symptoms from these PEs, and he still denies nay SOB or chest pains. He was started on Lovenox at 100 mg bid , and he is taking 5 mg of Coumadin daily. He is at home taking it easy. INR today is only 1.2.    Review of Systems  Constitutional: Negative.   Respiratory: Negative.   Cardiovascular: Positive for leg swelling. Negative for chest pain and palpitations.       Objective:   Physical Exam  Constitutional: He appears well-developed and well-nourished. No distress.  Cardiovascular: Normal rate, regular rhythm, normal heart sounds and intact distal pulses.  Exam reveals no gallop and no friction rub.   No murmur heard. Pulmonary/Chest: Effort normal and breath sounds normal. No respiratory distress. He has no wheezes. He has no rales. He exhibits no tenderness.  Musculoskeletal:       The right calf is swollen bit much less so than at our last visit. No tenderness or cords, Homans is negative.           Assessment & Plan:  He is doing quite well. We will increase the Coumadin to 10 mg a day and continue the Lovenox for now. He will return in 4 days to check the INR. When it reaches at least 2.0 we will stop the Lovenox.

## 2011-01-17 NOTE — Miscellaneous (Signed)
Summary: Orders Update  Clinical Lists Changes  Orders: Added new Test order of Venous Duplex Lower Extremity (Venous Duplex Lower) - Signed 

## 2011-01-17 NOTE — Miscellaneous (Signed)
Summary: Orders Update  Clinical Lists Changes  Problems: Added new problem of EDEMA LEG (ICD-782.3) Orders: Added new Test order of Venous Duplex Lower Extremity (Venous Duplex Lower) - Signed

## 2011-01-19 ENCOUNTER — Ambulatory Visit (INDEPENDENT_AMBULATORY_CARE_PROVIDER_SITE_OTHER): Payer: Medicare Other | Admitting: Family Medicine

## 2011-01-19 DIAGNOSIS — I2699 Other pulmonary embolism without acute cor pulmonale: Secondary | ICD-10-CM

## 2011-01-19 LAB — POCT INR: INR: 2.3

## 2011-01-22 NOTE — H&P (Signed)
[name], [name]             ACCOUNT NO.:  [id number]  MEDICAL RECORD NO.:  [id number]           PATIENT TYPE:  I  LOCATION:  [id number]                         FACILITY:  [facility]  PHYSICIAN:  Rise Patience, MDDATE OF BIRTH:  06-24-1933  DATE OF ADMISSION:  01/10/2011 DATE OF DISCHARGE:                             HISTORY & PHYSICAL   PRIMARY CARE PHYSICIAN:  Ishmael Holter. Sarajane Jews, MD  CHIEF COMPLAINT:  Positive for DVT.  HISTORY OF PRESENT ILLNESS:  This 77 year old male with known history of BPH, hypertension, hyperlipidemia, and history of hypercalcemia has had injury during the Christmas time, a month and half ago in his right lower extremity after which he noted some swelling.  He took some rest at the time for a day or two after which he started having his usual activity.  He did go to his PCP a week ago.  At that time, the swelling was stable but after which the swelling got worse and he went to the PCP yesterday who referred him to have a Doppler done and it was positive for DVT and the patient was referred to ER.  In the ER, the patient had a CT chest which showed PE.  The patient was admitted for further workup.  At this time, the patient does not have any chest pain or shortness of breath, nausea, vomiting or abdominal pain, dizziness or loss of consciousness.  Denies any fever or chills.  Denies any dysuria, discharge, or diarrhea.  The patient at this time is found to be having PE, but hemodynamically stable.  PAST MEDICAL HISTORY: 1. History of hypertension. 2. Hypercalcemia. 3. Hyperlipidemia. 4. BPH.  PAST SURGICAL HISTORY:  Right knee replacement and left knee arthroscopy.  MEDICATIONS ON ADMISSION: 1. Multivitamins. 2. Metamucil. 3. Beta-carotene. 4. Aspirin 81 mg. 5. Docusate 100 mg. 6. Protonix 40. 7. Furosemide 20 mg. 8. Atorvastatin 10 mg. 9. Celebrex p.r.n. 10.Doxazosin 4 mg daily. 11.Avodart 0.5 mg every other day.  ALLERGIES:  No known drug  allergies.  FAMILY HISTORY:  Negative for any PE or DVT.  It is positive for colon cancer in his father.  The patient is closely following with Dr. Deatra Ina of [place] with colonoscopy.  SOCIAL HISTORY:  The patient is married.  Denies smoking cigarettes, drinks alcohol occasionally wine every night, and denies any drug abuse.  REVIEW OF SYSTEMS:  As present in history of present illness, nothing else significant.  PHYSICAL EXAMINATION:  GENERAL:  The patient examined at bedside, not in acute distress. VITAL SIGNS:  Blood pressure is 150/80, pulse is 70 per minute, temperature 98.8, respirations 18 per minute, and O2 sat 92%. HEENT:  Anicteric.  No pallor.  No discharge from ears, eyes, nose, or mouth. CHEST:  Bilateral air entry present.  No rhonchi.  No crepitation. HEART:  S1 and S2 heard. ABDOMEN:  Soft, nontender.  Bowel sounds heard. CNS:  The patient is alert, awake, and oriented to time, place, and person. Moves upper and lower extremities 5/5. EXTREMITIES:  There is swelling in the right lower extremity extending up to his knee.  The left lower extremity looks normal.  There is no clubbing in his hands or cyanosis.  LABORATORY DATA:  CT angio chest shows positive with small volume of acute pulmonary emboli in the right upper and lower lobe segmental branches as above.  No other acute findings.  CBC:  WBC 6.5, hemoglobin is 12.6, hematocrit is 36.8, and platelets 216.  PT/INR is 12.9 and 1.95.  Basic metabolic panel:  Sodium [id number], potassium 3.9, chloride 103, carbon dioxide 26, glucose 95, BUN 12, creatinine 0.8, and calcium 10.8.  ASSESSMENT: 1. Pulmonary embolism with deep venous thrombosis, hemodynamically     stable. 2. Hypercalcemia. 3. Hypertension. 4. Benign prostatic hypertrophy. 5. Hyperlipidemia.  PLAN: 1. At this time, admit the patient to telemetry. 2. For his PE, the patient has already been started on Lovenox and     Coumadin.  I think the  precipitating factor for this is leg injury     with bedrest.  The patient will need Coumadin at least for 3-6     months after which procoagulant workup can be done at his PCP's     office. 3. Hypercalcemia.  The patient stated he did follow with Dr. Renato Shin for hypercalcemia and at that time Dr. Renato Shin had     discontinued his hydrochlorothiazide and started on Lasix after     which his calcium level improved but now again he has hypercalcemia     and he is actually supposed to follow up with Dr. Irving Shows     next week for this.  At this time, I am going to check intact     parathormone level, phosphorus level, and vitamin D levels.  I am     gently hydrating the patient at this time.  We will hold off the     Lasix. 4. Further recommendation as condition evolves.     Rise Patience, MD     ANK/MEDQ  D:  01/10/2011  T:  01/11/2011  Job:  [id number]  cc:   Ishmael Holter. Sarajane Jews, MD  Electronically Signed by Gean Birchwood MD on 01/22/2011 04:55:47 PM

## 2011-01-23 ENCOUNTER — Ambulatory Visit (INDEPENDENT_AMBULATORY_CARE_PROVIDER_SITE_OTHER): Payer: Medicare Other | Admitting: Endocrinology

## 2011-01-23 ENCOUNTER — Encounter: Payer: Self-pay | Admitting: Endocrinology

## 2011-01-23 DIAGNOSIS — E210 Primary hyperparathyroidism: Secondary | ICD-10-CM

## 2011-01-26 ENCOUNTER — Other Ambulatory Visit: Payer: Medicare Other | Admitting: Family Medicine

## 2011-01-26 DIAGNOSIS — I2699 Other pulmonary embolism without acute cor pulmonale: Secondary | ICD-10-CM

## 2011-02-01 NOTE — Assessment & Plan Note (Signed)
Summary: FOLLOW UP PER DR FRY/NWS   Vital Signs:  Patient profile:   77 year old male Height:      70 inches Weight:      212.75 pounds BMI:     30.64 O2 Sat:      96 % on Room air Temp:     98.4 degrees F oral Pulse rate:   85 / minute BP sitting:   106 / 70  (left arm) Cuff size:   regular  Vitals Entered By: Crissie Sickles, CMA (January 23, 2011 9:07 AM)  O2 Flow:  Room air CC: elevated calcium per PCP/DBD   Referring Provider:  Alysia Penna MD Primary Provider:  Alysia Penna, MD  CC:  elevated calcium per PCP/DBD.  History of Present Illness: pt has h/o htn and primary hyperparathyroidism.  ca++ normalized with discontinuation of the hctz, but has recently recurred.  he was recently hospitalized with dvt/pe.  pt states he feels well in general, except for constipation.  he has never had urolithiasis.  he has lost a few lbs since last ov  Allergies: No Known Drug Allergies  Past History:  Past Medical History: Last updated: 05/22/2010  normal cardiac stress test 10-29-08  DIVERTICULITIS, COLON (ICD-562.11) SCIATICA (ICD-724.3) ERECTILE DYSFUNCTION (ICD-302.72) BENIGN PROSTATIC HYPERTROPHY (ICD-600.00) OSTEOARTHRITIS (ICD-715.90 HEADACHE (ICD-784.0) COLONIC POLYPS, HX OF (ICD-V12.72) HYPERTENSION (ICD-401.9) HYPERLIPIDEMIA (ICD-272.4) GERD (ICD-530.81) ALLERGIC RHINITIS (ICD-477.9) hypercalcemia, sees Dr. Loanne Drilling  Review of Systems       denies n/v  Physical Exam  General:  normal appearance.   Neck:  Supple without thyroid enlargement or tenderness.    Impression & Recommendations:  Problem # 1:  PRIMARY HYPERPARATHYROIDISM (ICD-252.01) with recurrent hypercalcemia, despite being off the hctz. Calcium: 10.3 (05/17/2010)   Albumin: 4.4 (12/19/2009)   Creatinine: 0.8 (05/17/2010) Chloride: 106 (05/17/2010)   PTH-Intact: 70.0 (05/17/2010) Vit D 25-OH: 30 (03/22/2010)     Problem # 2:  constipation. may be exac by #1  Medications Added to  Medication List This Visit: 1)  Warfarin Sodium 10 Mg Tabs (Warfarin sodium) .Marland Kitchen.. 1 tab once daily  Other Orders: Est. Patient Level III [id number]  Patient Instructions: 1)  Please schedule a follow-up appointment in 6 months. 2)  no treatment is needed now for the calcuim.   Orders Added: 1)  Est. Patient Level III [id number]

## 2011-02-09 ENCOUNTER — Other Ambulatory Visit: Payer: Medicare Other

## 2011-02-09 DIAGNOSIS — I2699 Other pulmonary embolism without acute cor pulmonale: Secondary | ICD-10-CM

## 2011-02-16 ENCOUNTER — Ambulatory Visit (INDEPENDENT_AMBULATORY_CARE_PROVIDER_SITE_OTHER): Payer: Medicare Other | Admitting: Family Medicine

## 2011-02-16 ENCOUNTER — Encounter: Payer: Self-pay | Admitting: Family Medicine

## 2011-02-16 VITALS — BP 122/80 | HR 96 | Wt 215.0 lb

## 2011-02-16 DIAGNOSIS — I82409 Acute embolism and thrombosis of unspecified deep veins of unspecified lower extremity: Secondary | ICD-10-CM

## 2011-02-16 DIAGNOSIS — I2699 Other pulmonary embolism without acute cor pulmonale: Secondary | ICD-10-CM

## 2011-02-16 LAB — POCT I-STAT 4, (NA,K, GLUC, HGB,HCT)
Glucose, Bld: 103 mg/dL — ABNORMAL HIGH (ref 70–99)
HCT: 39 % (ref 39.0–52.0)
Hemoglobin: 13.3 g/dL (ref 13.0–17.0)
Potassium: 4 mEq/L (ref 3.5–5.1)
Sodium: 139 mEq/L (ref 135–145)

## 2011-02-16 LAB — POCT INR: INR: 2

## 2011-02-16 NOTE — Patient Instructions (Signed)
Same dose 

## 2011-02-16 NOTE — Progress Notes (Signed)
  Subjective:    Patient ID: [name], male    DOB: 1933/05/20, 77 y.o.   MRN: [id number]  HPI Here to follow up on a right leg DVT and multiple PEs diagnosed on 01-10-11. He feels great except for some residual swelling in the leg and foot. His INR today is 2.0. No SOB or chest pain. He is anxious to play golf again.    Review of Systems  Constitutional: Negative.   Respiratory: Negative.   Cardiovascular: Positive for leg swelling. Negative for chest pain and palpitations.       Objective:   Physical Exam  Constitutional: He appears well-developed and well-nourished.  Cardiovascular: Normal rate, regular rhythm, normal heart sounds and intact distal pulses.  Exam reveals no gallop and no friction rub.   No murmur heard. Pulmonary/Chest: Effort normal and breath sounds normal. No respiratory distress. He has no wheezes. He has no rales. He exhibits no tenderness.  Musculoskeletal:       Mild edema of the right lower leg and foot           Assessment & Plan:  He is doing as well as expected. We will adjust the Coumadin to 10 mg every day except 5 mg on Mondays. Recheck the INR in 4 weeks and see me in 8 weeks. I told him it would be okay to play 9 holes of golf a day if he rides a cart.

## 2011-03-12 ENCOUNTER — Ambulatory Visit (INDEPENDENT_AMBULATORY_CARE_PROVIDER_SITE_OTHER): Payer: Medicare Other | Admitting: Family Medicine

## 2011-03-12 DIAGNOSIS — I2699 Other pulmonary embolism without acute cor pulmonale: Secondary | ICD-10-CM

## 2011-03-12 NOTE — Patient Instructions (Signed)
Same dose 

## 2011-03-26 ENCOUNTER — Ambulatory Visit (INDEPENDENT_AMBULATORY_CARE_PROVIDER_SITE_OTHER): Payer: Medicare Other | Admitting: Family Medicine

## 2011-03-26 ENCOUNTER — Encounter: Payer: Self-pay | Admitting: Family Medicine

## 2011-03-26 VITALS — BP 102/70 | HR 100 | Temp 98.7°F | Wt 218.0 lb

## 2011-03-26 DIAGNOSIS — R609 Edema, unspecified: Secondary | ICD-10-CM

## 2011-03-26 DIAGNOSIS — R600 Localized edema: Secondary | ICD-10-CM

## 2011-03-26 DIAGNOSIS — I2699 Other pulmonary embolism without acute cor pulmonale: Secondary | ICD-10-CM

## 2011-03-26 DIAGNOSIS — I82409 Acute embolism and thrombosis of unspecified deep veins of unspecified lower extremity: Secondary | ICD-10-CM

## 2011-03-26 MED ORDER — FUROSEMIDE 40 MG PO TABS: 40.0000 mg | ORAL_TABLET | Freq: Every day | ORAL | Status: DC

## 2011-03-26 NOTE — Progress Notes (Signed)
  Subjective:    Patient ID: [name], male    DOB: 08/30/1933, 77 y.o.   MRN: [id number]  HPI Here to ask about some mild swelling he has had in both feet and lower legs over the past week. He admits to being up on his feet more than ever lately, with shopping, playing some golf, etc. Otherwise he feels fine. No SOB or chest pain.    Review of Systems  Constitutional: Negative.   Respiratory: Negative.   Cardiovascular: Positive for leg swelling. Negative for chest pain and palpitations.       Objective:   Physical Exam  Cardiovascular: Normal rate, regular rhythm, normal heart sounds and intact distal pulses.   Pulmonary/Chest: Effort normal and breath sounds normal. No respiratory distress.  Musculoskeletal:       2+ edema in both lower legs and feet No tenderness          Assessment & Plan:  This is benign LE edema, probably related to being up on his feet more than he has been used to. Increase Lasix to 40 mg a day. Recheck next month

## 2011-04-13 NOTE — Op Note (Signed)
NAMEMONT, HIATT                       ACCOUNT NO.:  [id number]   MEDICAL RECORD NO.:  [id number]                   PATIENT TYPE:  INP   LOCATION:  X005                                 FACILITY:  [facility]   PHYSICIAN:  Dione Plover. Aluisio, M.D.              DATE OF BIRTH:  Jan 08, 1933   DATE OF PROCEDURE:  08/10/2002  DATE OF DISCHARGE:                                 OPERATIVE REPORT   PREOPERATIVE DIAGNOSIS:  Osteoarthritis, right knee.   POSTOPERATIVE DIAGNOSIS:  Osteoarthritis, right knee.   PROCEDURE:  Right total knee arthroplasty.   SURGEON:  Dione Plover. Aluisio, M.D.   ASSISTANT:  Alexzandrew L. Dara Lords, P.A.   ANESTHESIA:  Spinal.   ESTIMATED BLOOD LOSS:  Minimal.   DRAINS:  Hemovac x1.   TOURNIQUET TIME:  60 minutes at 300 mmHg.   COMPLICATIONS:  None.   CONDITION:  Stable to recovery.   BRIEF CLINICAL NOTE:  The patient is a 77 year old male with severe  osteoarthritis of the right knee, with pain refractory to nonoperative  management.  He presents now for right total knee arthroplasty.   DESCRIPTION OF PROCEDURE:  After the successful administration of spinal  anesthetic, the tourniquet was placed high in the right thigh, the right  lower extremity prepped and draped in the usual sterile fashion.  The  extremity was wrapped with an Esmarch, the knee flexed, tourniquet inflated  to 300 mmHg.  A standard midline incision was made with a 10 blade through  the subcutaneous tissue to the extensor mechanism.  A fresh blade was used  to make a medial parapatellar arthrotomy.  Soft tissue over the proximal  medial tibia subperiosteally elevated to the joint line with a knife into  the semimembranosus bursa with a curved osteotome, and the soft tissue over  the proximal lateral tibia was also elevated with attention being paid to  avoiding the patellar tendon on the tibial tubercle.  The lateral  patellofemoral ligament was cut, the patella everted, and the knee  flexed to  90 degrees, and ACL and PCL removed.  He had severe bone-on-bone change of  all three compartments.  There was massive osteophyte formation throughout.  The ACL and PCL were removed, and then a drill was used to create a starting  hole in the distal femur.  The canal was irrigated and the 5 degree right  valgus alignment guide was placed.  Referencing off the posterior condyles,  rotation was marked and a block pinned to remove 10 mm off the distal femur.  The distal femoral resection was then made with an oscillating saw.  A  sizing block is placed, and a size 5 is most appropriate.  We referenced off  the epicondylar axis, and a block is pinned for the anterior and posterior  cuts.  The cuts are subsequently made.   The tibia is then subluxed forward and the meniscal remnants removed.  Extramedullary  tibial alignment guide placed referencing proximally at the  medial aspect of the tibial tubercle and distally along the second  metatarsal axis and tibial crest.  There were defects on both sides, so we  zeroed off the bottom of the lateral defect.  The tibial resection was then  made with an oscillating saw.  A size 6 was the most appropriate tibia, and  then the proximal tibia is prepared with the modular drill and keel punch.  A size 5 chamfer and intercondylar block is then placed on the distal femur  and those cuts are made.  A size 5 posterior-stabilized trial is placed with  the 6 mobile bearing tibial tray, size 5 12.5 mm rotating platform,  posterior-stabilized insert trial.  Full extension is achieved with  excellent varus and valgus balance throughout.  The patella is then everted,  thickness measured to be 26 mm.  Free-hand resection taken down to 14 mm.  A  41 template placed.  A lug hole is drilled, trial patella placed, and the  patella tracks normally.  The trials are then removed with the exception of  the femoral trial.  With the femoral trial in place, the  osteophytes are  removed off the posterior femur.  Trials are then removed and the cut bone  surfaces prepared with pulsatile lavage.  Cement is mixed and once ready for  implantation, a size 6 mobile bearing tibial tray, size 5 posterior  stabilized femur, and a 41 patella are cemented in place with the patella  being held with a clamp.  A 12.5 mm trial insert is placed and the knee held  in full extension and all the ____________ cement removed.  Once the cement  is fully hardened, then the permanent 12.5 mm rotating platform posterior-  stabilized insert is placed.  The wound is then copiously irrigated with  antibiotic solution and the extensor mechanism closed over a Hemovac drain  with interrupted #1 PDS.  The tourniquet is loosed, a total time of 60  minutes.  Some minor bleeding stopped with cautery.  The subcu is closed  with interrupted 2-0 Vicryl, subcuticular running 4-0 Monocryl.  The  incision is cleaned and dried and Steri-Strips and a bulky sterile dressing  applied.  The patient is then awakened and transported to recovery in stable  condition.                                               Dione Plover Aluisio, M.D.    FVA/MEDQ  D:  08/10/2002  T:  08/10/2002  Job:  [id number]

## 2011-04-13 NOTE — Discharge Summary (Signed)
[name], [name]                       ACCOUNT NO.:  [id number]   MEDICAL RECORD NO.:  [id number]                   PATIENT TYPE:  INP   LOCATION:  [id number]                                 FACILITY:  [facility]   PHYSICIAN:  Gaynelle Arabian, MD                   DATE OF BIRTH:  05-15-1933   DATE OF ADMISSION:  08/10/2002  DATE OF DISCHARGE:  08/14/2002                                 DISCHARGE SUMMARY   ADMISSION DIAGNOSES:  1. Osteoarthritis, right knee.  2. History of migraine headaches.  3. Diverticulosis.  4. History of colon polyps with polypectomy x2.   DISCHARGE DIAGNOSES:  1. Osteoarthritis, right knee, status post right total knee replacement     arthroplasty.  2. Mild postoperative blood loss anemia.  3. History of migraine headaches.  4. Diverticulosis.  5. History of colon polyps with polypectomy x2.   PROCEDURE:  The patient was taken to the OR on 08/10/02, and underwent a  right total knee replacement arthroplasty.  Surgeon was Dr. Gaynelle Arabian,  assistant was Arlee Muslim, P.A.-C.  Surgery was under spinal anesthesia.  Hemovac drain x1.  Tourniquet time was 60 minutes at 300 mmHg.   CONSULTATIONS:  None.   HISTORY OF PRESENT ILLNESS:  The patient is a 77 year old male who has been  seen and evaluated for ongoing right knee pain.  He has been treated  conservatively in the past, and has undergone a cortisone injection with  only limited benefit.  He is very active, enjoys playing golf, however, his  knee has started to interfere with his daily activities and his active  lifestyle.  He is found in the office to have bone-on-bone changes,  tricompartmental arthritis in the right knee.  It is felt he would benefit  from undergoing a total knee replacement.  The risks and benefits were  discussed, and he elected to proceed to surgery.   LABORATORY DATA:  CBC on admission revealed a hemoglobin of 12.9, hematocrit  of 37.7, white blood cell count 5.4, red blood cell  count 3.94.  Differential within normal limits with the exception of elevated monos at  12.  Postoperative hemoglobin and hematocrit 11.4 and 33.6, last noted  hemoglobin and hematocrit 9.6 and 27.5.  PT and PTT on admission were 12.4  and 28, respectively, with an INR of 0.9.  Serum prothrombin times were  followed per Coumadin protocol.  Last noted PT/INR was 18.9 and 1.7.  Chem  panel on admission was all within normal limits.  Followup BMET, serial BMET  showed a drop in sodium down to 133, fluids were adjusted, sodium came back  up to 136.  There was a drop in potassium down to 3.9, last noted potassium  was 3.4.  Urinalysis on admission was negative.  Blood group type O+.  Chest  x-ray dated 08/04/02, eventration of right hemidiaphragm with right basilar  atelectasis, mild cardiomegaly, no  active disease.  I do not see an EKG.   HOSPITAL COURSE:  The patient was admitted to [facility], taken to  the OR, underwent the above stated procedure without complications.  The  patient tolerated the procedure well.  Later transferred to the recovery  room and then to the orthopaedic floor for continued postoperative care.  The patient was placed on PCA analgesics for pain control following surgery.  Given 24 hours of postoperative IV antibiotics, placed on weightbearing as  tolerated to the right lower extremity.  Physical therapy and occupational  therapy consulted to assist with gait training ambulation.  Hemovac drain  placed at time of surgery was pulled on postoperative day #2.  It was  inadvertently pulled sometime during the night or early in the morning  between postoperative day #1 and postoperative day #2 without difficulty.  The patient was placed on Coumadin for deep venous thrombosis prophylaxis.  From a therapy standpoint, the patient did very well with physical therapy.  He ambulated approximately 70 feet by postoperative day #2, and increased up  to greater than 200  feet by postoperative day #3.  Dressings changes were  initiated on postoperative day #2.  Incision was healing well.  The patient  did have a low-grade temperature on postoperative day #2 of 100.0, and  incentive spirometer was encouraged.  He was passing flatus by postoperative  day #2, but had not had a bowel movement yet.  Medications were provided in  the form of suppository.  By postoperative day #3, his bowels started  moving, he was voiding without difficulty, had been weaned over to p.o.  analgesics, continued to progress with physical therapy.  Discharge plan was  arranged for the following day, 08/14/02.  He was seen and evaluated, doing  quite well, meeting all of his goals in therapy, and was discharged home.   DISPOSITION:  The patient is discharged home on 08/14/02.   DISCHARGE DIAGNOSES:  Please see above.   DISCHARGE MEDICATIONS:  1. Coumadin.  2. Trinsicon.  3. Percocet.  4. Robaxin.  5. Restoril.   ACTIVITY:  Weightbearing as tolerated.  Home health physical therapy, home  health nursing through [facility] total knee protocol.   DIET:  Diverticular diet.   FOLLOWUP:  Follow up next week in the office after discharge, call the  office for an appointment.   CONDITION ON DISCHARGE:  Improved.      Alexzandrew L. Dara Lords, P.A.              Gaynelle Arabian, MD    ALP/MEDQ  D:  09/08/2002  T:  09/08/2002  Job:  [id number]

## 2011-04-13 NOTE — Op Note (Signed)
[name], [name]                       ACCOUNT NO.:  [id number]   MEDICAL RECORD NO.:  [id number]                   PATIENT TYPE:  AMB   LOCATION:  [place]                                  FACILITY:  [place]   PHYSICIAN:  Orson Ape. Rise Patience, M.D.          DATE OF BIRTH:  July 20, 1933   DATE OF PROCEDURE:  01/31/2004  DATE OF DISCHARGE:                                 OPERATIVE REPORT   PREOPERATIVE DIAGNOSIS:  Right inguinal hernia.   POSTOPERATIVE DIAGNOSIS:  Right inguinal hernia, indirect with a slider.   OPERATION PERFORMED:  Right inguinal herniorrhaphy with mesh reinforcement.   SURGEON:  Orson Ape. Rise Patience, M.D.   ANESTHESIA:  General with local supplementation.   INDICATIONS FOR PROCEDURE:  [name] is a 77 year old male who weighs  about 250 pounds, who I saw approximately five or six weeks ago with, he  stated, recent onset of a bulge in the right groin.  This was about a lemon-  sized defect and he planned to go to [place] for a month and completed his  trip.  Really did not have any significant problem there, but was aware of  the hernia when he was playing golf, etc.  He is now for repair and we have  discussed about mesh reinforcement.   DESCRIPTION OF PROCEDURE:  Preoperatively, he was given a gram of Kefzol,  positioned on the operating room table.  Induction of anesthesia with LOA  tube.  The right groin had first been clipped.  He marked himself on the  right and then the prep with Betadine surgical scrub and solution and draped  in a sterile manner.  The inguinal incision area was infiltrated with  approximately 10 mL of Marcaine 0.5% with 0.25% Xylocaine with Adrenalin and  the ilioinguinal nerve area was anesthetized with about 10 mL with a blunted  22 gauge needle.  Sharp dissection down through the skin and subcutaneous  tissue, is quite thick, and the external oblique identified.  The external  ring had been stretched.  You could see a large  nerve coming through it and  I anesthetized the external fascia and then opened it in the direction of  its fibers, protecting the nerve.  The cord structures were elevated with a  Penrose and we dissected the floor, the floor given away somewhat but this  was an indirect hernia with a fairly good sized hernia sac that had a loop  of either small bowel or right close to the terminal ileum that was kind of  within the wall.  I kind of freed that up and did a sort of like a sliding  repair with a 2-0 Vicryl so that intestine was back within the peritoneal  cavity and then a high sac direct ligation.  The hernia sac was removed and  then we went and repaired the floor with a 2-0 Prolene starting at the  symphysis pubis, recreating the internal ring  and then taking it back and  tying the two ends together.  The piece of Prolene mesh shaped like a sail  slit laterally was used to reinforce the floor.  The inferior lamina was  sutured to inguinal ligament with a running 2-0 Prolene.  The ilioinguinal  nerve was placed with the cord structures and within and then the superior  flap was sutured down with interrupted sutures of 2-0 Prolene.  The two  tails had been sutured together laterally and the mesh was lying flat  without excessive tension. Next, the external oblique was closed with a  running 2-0 Vicryl.  Scarpa's fascia was closed with interrupted 2-0 Vicryl  and a 5-0 Dexon subcuticular and benzoin and Steri-Strips on the skin.  The  patient tolerated the procedure nicely and was  sent to the recovery room breathing spontaneously.  I did use all total  about 40 mL of Marcaine with Adrenalin and Xylocaine mixture to hopefully  minimize the immediate postoperative pain.  I have encouraged to be kind of  on liquids for a couple of days and not to drive if he was taking the Tylox  for pain.                                               Orson Ape. Rise Patience, M.D.    WJW/MEDQ  D:   01/31/2004  T:  01/31/2004  Job:  [id number]   cc:   Annie Main A. Sarajane Jews, M.D. [facility]

## 2011-04-13 NOTE — H&P (Signed)
[name], [name]                       ACCOUNT NO.:  [id number]   MEDICAL RECORD NO.:  [id number]                   PATIENT TYPE:  INP   LOCATION:  [place]                                 FACILITY:  [facility]   PHYSICIAN:  Dione Plover. Aluisio, M.D.              DATE OF BIRTH:  05-18-1933   DATE OF ADMISSION:  08/10/2002  DATE OF DISCHARGE:                                HISTORY & PHYSICAL   CHIEF COMPLAINT:  Right knee pain.   HISTORY OF PRESENT ILLNESS:  The patient is a 77 year old male who has been  seen and evaluated by Dr. Gaynelle Arabian for right knee pain.  He has known  osteoarthritis of the right knee.  He has been treated conservatively in the  past and also has undergone cortisone injection with only limited benefit.  The knee has been progressively getting worse.  He is very active and enjoys  playing golf.  However, the knee has started to slow him down.  He was seen  in the office.  Found to have bone on bone changes with tricompartment  arthritis in the right knee.  It is felt at this point he would benefit from  undergoing total knee replacement arthroplasty.  Risks and benefits  discussed and the patient has elected to proceed with surgery.   ALLERGIES:  No known drug allergies.   CURRENT MEDICATIONS:  1. He does take Metamucil one and one-half teaspoon daily.  2. He is on Lipitor 10 mg q.d.  3. Bextra 20 mg daily.  4. He takes Flonase nasal spray p.r.n.  5. He is on [place] M daily.  6. Doxazosin 4 mg daily.   PAST MEDICAL HISTORY:  History of migraine headaches, history of colon  polyps and also diverticulosis.   PAST SURGICAL HISTORY:  He has had a right knee surgery back in 1954.  Since  then he has also undergone a right knee arthroscopy.  He has undergone colon  polypectomy x2.   FAMILY HISTORY:  Father deceased age 18 with a history of colon cancer.  Mother deceased age 22 with a history of cancer.   SOCIAL HISTORY:  He is married.  Currently  retired.  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  Nonsmoker.  Only occasionally  intake of alcohol.  Has four children.  His wife will be assisting him after  surgery.  He does live in a two-story home with only one step entering.   REVIEW OF SYMPTOMS:  GENERAL:  No fevers, chills, night sweats.  NEUROLOGIC:  No seizures, syncope, or paralysis.  RESPIRATORY:  No shortness of breath,  productive cough, or hemoptysis.  CARDIOVASCULAR:  No chest pain, angina,  orthopnea.  GASTROINTESTINAL:  No nausea, vomiting, diarrhea, constipation,  blood or mucus in the stool recently.  GENITOURINARY:  He does have some  occasional nocturia.  No dysuria, hematuria, or discharge.  MUSCULOSKELETAL:  Pertinent  to that of the right knee found in the history of present illness.   PHYSICAL EXAMINATION:  VITAL SIGNS:  Pulse 76, respirations 16, blood  pressure 120/60.  GENERAL:  The patient is a 77 year old well-nourished, well-developed,  appears to be in no acute distress.  He is alert, oriented, cooperative,  pleasant at time of examination.  Appears to be an excellent historian.  HEENT:  Normocephalic, atraumatic.  Pupils round, reactive.  Oropharynx is  clear.  NECK:  Supple.  CHEST:  Clear to auscultation anterior/posterior chest.  No rhonchi, rales,  or wheezing is appreciated.  HEART:  Regular rate and rhythm without murmurs.  S1, S2 is noted.  ABDOMEN:  Soft, slightly round abdomen.  Bowel sounds are present.  Nontender to palpation.  RECTAL:  Not done.  Not pertinent to present illness.  BREASTS:  Not done.  Not pertinent to present illness.  GENITALIA:  Not done.  Not pertinent to present illness.  EXTREMITIES:  Right lower extremity:  The right knee has range of motion of  0-115 degrees.  There is no effusion.  Moderate crepitus noted on passive  range of motion.  There is no instability appreciated.  Pulses noted to be  intact.   LABORATORIES:  X-rays taken in the  office show severe tricompartment  arthritis with bone on bone changes throughout.   IMPRESSION:  1. Osteoarthritis right knee.  2. History of migraine headaches.  3. Diverticulosis.  4. History of colon polyps with polypectomy x2.   PLAN:  The patient will be admitted to [facility] to undergo a  right total knee replacement arthroplasty.  Surgeon will be Dr. Pilar Plate  Aluisio.     [name], Hopkins Aluisio, M.D.    ALP/MEDQ  D:  08/12/2002  T:  08/12/2002  Job:  [id number]

## 2011-04-13 NOTE — Assessment & Plan Note (Signed)
[facility] OFFICE NOTE   [name], [name]                    MRN:          [id number]  DATE:12/12/2006                            DOB:          Jul 28, 1933    This is a 77 year old gentleman here for a complete physical exam. He  feels fine and has no complaints at all. I saw him a couple of months  ago for heartburn. Once he got on daily acid-suppression medication, he  has done extremely well. His next colonoscopy is not due until 2009. He  did have a normal cardiac stress test in August of 2004. He took upon  himself to have some arterial screening in November of this year which  included carotid artery Doppler evaluation. This apparently was all  clear. He had his usual flu shot this past October. He also had a  Zostavax  immunization this past October. He has not had a pneumonia  vaccine in many years, apparently. For further details of his past  medical history, family history, social history, habits, etc., refer to  our last physical note dated November 30, 2005.   ALLERGIES:  None.   CURRENT MEDICATIONS:  1. Lipitor 10 mg per day.  2. Metamucil daily.  3. Aspirin 81 mg per day.  4. Cardura 4 mg per day.  5. Flonase nasal spray daily.  6. Multivitamin daily.  7. Avodart 0.5 mg 1 every other day.  8. Celebrex 200 mg per day.  9. Hydrochlorothiazide 25 mg per day.  10.Protonix 40 mg per day.   OBJECTIVE:  Height 5 feet 11 inches, weight 230, BP 106/64, pulse 76 and  regular.  In general, he remains overweight.  SKIN:  Is free of significant lesions although he has a large number of  benign nevi and seborrheic keratosis scattered throughout.  Eyes are clear. He wears glasses. Ears clear. Oropharynx clear.  NECK:  Supple without lymphadenopathy or masses.  LUNGS:  Clear.  CARDIAC:  Rate and rhythm regular without gallops, murmurs or rubs.  Distal pulses are full. EKG shows right bundle branch  which is at his  baseline.  ABDOMEN:  Soft, normal bowel sounds, nontender, no masses.  GENITALIA:  He is circumcised.  RECTAL EXAM:  No masses or tenderness. Prostate is moderately enlarged  but smooth. Stool hemoccult negative.  EXTREMITIES:  No clubbing, cyanosis, or edema.  NEUROLOGICAL EXAM:  Grossly intact.   ASSESSMENT AND PLAN:  1. Complete physical. I encouraged him to get more regular exercise      and loose a little weight. He was given a pneumonia vaccine today.      We will also check the usual laboratories.  2. Hypertension. Stable.  3. Benign prostatic hypertrophy. Stable.  4. Hyperlipidemia. Will check a fasting lipid panel today.  5. Gastroesophageal reflux disease. Stable.  6. Degenerative joint disease. Stable.     Ishmael Holter. Sarajane Jews, MD  Electronically Signed    SAF/MedQ  DD: 12/12/2006  DT: 12/12/2006  Job #: [id number]

## 2011-04-16 ENCOUNTER — Encounter: Payer: Self-pay | Admitting: Family Medicine

## 2011-04-16 ENCOUNTER — Ambulatory Visit (INDEPENDENT_AMBULATORY_CARE_PROVIDER_SITE_OTHER): Payer: Medicare Other | Admitting: Family Medicine

## 2011-04-16 VITALS — BP 104/62 | HR 100 | Temp 98.3°F | Wt 220.0 lb

## 2011-04-16 DIAGNOSIS — I82409 Acute embolism and thrombosis of unspecified deep veins of unspecified lower extremity: Secondary | ICD-10-CM

## 2011-04-16 DIAGNOSIS — R600 Localized edema: Secondary | ICD-10-CM

## 2011-04-16 DIAGNOSIS — I2699 Other pulmonary embolism without acute cor pulmonale: Secondary | ICD-10-CM

## 2011-04-16 DIAGNOSIS — R609 Edema, unspecified: Secondary | ICD-10-CM

## 2011-04-16 LAB — POCT INR: INR: 2.7

## 2011-04-16 NOTE — Patient Instructions (Signed)
Same dose,5 mg only on mondays then 10 mg on other days.Check in 4 weeks

## 2011-04-16 NOTE — Progress Notes (Signed)
  Subjective:    Patient ID: [name], male    DOB: 1933/07/03, 77 y.o.   MRN: [id number]  HPI Here to follow up LE edema and PEs. Last month we increased his Lasix to 40 mg a day and this has been very helpful. Now he feels fine with no concerns. He has been playing 9 holes of golf with no problems. It has been 3 months since we found his DVT and PEs, so we can now get some follow up scans.    Review of Systems  Constitutional: Negative.   Respiratory: Negative.   Cardiovascular: Negative.        Objective:   Physical Exam  Constitutional: He appears well-developed and well-nourished.  Cardiovascular: Normal rate, regular rhythm, normal heart sounds and intact distal pulses.   Pulmonary/Chest: Effort normal and breath sounds normal.  Musculoskeletal:       Trace edema to both lower legs           Assessment & Plan:  He is doing very well. We will set up another venous doppler and chest CT to assess our progress.

## 2011-04-18 ENCOUNTER — Other Ambulatory Visit (INDEPENDENT_AMBULATORY_CARE_PROVIDER_SITE_OTHER): Payer: Medicare Other

## 2011-04-18 ENCOUNTER — Other Ambulatory Visit: Payer: Self-pay | Admitting: Family Medicine

## 2011-04-18 DIAGNOSIS — I2699 Other pulmonary embolism without acute cor pulmonale: Secondary | ICD-10-CM

## 2011-04-18 DIAGNOSIS — I82409 Acute embolism and thrombosis of unspecified deep veins of unspecified lower extremity: Secondary | ICD-10-CM

## 2011-04-18 LAB — BASIC METABOLIC PANEL
BUN: 18 mg/dL (ref 6–23)
Glucose, Bld: 100 mg/dL — ABNORMAL HIGH (ref 70–99)
Sodium: 140 mEq/L (ref 135–145)

## 2011-04-20 ENCOUNTER — Other Ambulatory Visit: Payer: Self-pay | Admitting: *Deleted

## 2011-04-20 ENCOUNTER — Encounter (INDEPENDENT_AMBULATORY_CARE_PROVIDER_SITE_OTHER): Payer: Medicare Other | Admitting: *Deleted

## 2011-04-20 ENCOUNTER — Ambulatory Visit (INDEPENDENT_AMBULATORY_CARE_PROVIDER_SITE_OTHER)
Admission: RE | Admit: 2011-04-20 | Discharge: 2011-04-20 | Disposition: A | Discharge status: Home / Self Care | Payer: Medicare Other | Source: Ambulatory Visit | Attending: Family Medicine | Admitting: Family Medicine

## 2011-04-20 DIAGNOSIS — Z86718 Personal history of other venous thrombosis and embolism: Secondary | ICD-10-CM

## 2011-04-20 DIAGNOSIS — M7989 Other specified soft tissue disorders: Secondary | ICD-10-CM

## 2011-04-20 DIAGNOSIS — I82409 Acute embolism and thrombosis of unspecified deep veins of unspecified lower extremity: Secondary | ICD-10-CM

## 2011-04-20 DIAGNOSIS — I2699 Other pulmonary embolism without acute cor pulmonale: Secondary | ICD-10-CM

## 2011-04-20 MED ORDER — IOHEXOL 300 MG/ML  SOLN
100.0000 mL | Freq: Once | INTRAMUSCULAR | Status: AC | PRN
  Administered 2011-04-20: 100 mL via INTRAVENOUS

## 2011-04-24 ENCOUNTER — Encounter: Payer: Self-pay | Admitting: Family Medicine

## 2011-04-24 ENCOUNTER — Telehealth: Payer: Self-pay | Admitting: *Deleted

## 2011-04-24 NOTE — Telephone Encounter (Signed)
Spoke w/wife and informed of results and physician notes.

## 2011-04-24 NOTE — Progress Notes (Signed)
Pt informed

## 2011-04-24 NOTE — Telephone Encounter (Signed)
Message copied by Rockwell Germany on Tue Apr 24, 2011  2:49 PM ------      Message from: Alysia Penna A      Created: Tue Apr 24, 2011  8:32 AM       His PEs are resolved, everything looks okay on the scan. We should continue the Coumadin for another 3 months to make a total of 6 months before we stop it. He can now resume all activities, including 18 holes of golf.

## 2011-04-25 NOTE — Progress Notes (Signed)
Quick Note:  LMTCB ______ 

## 2011-05-01 NOTE — Progress Notes (Signed)
Call placed to patient at [phone], male individual stated patient was not available until this afternoon. Message was left for patient to return call regarding test results

## 2011-05-02 ENCOUNTER — Ambulatory Visit (INDEPENDENT_AMBULATORY_CARE_PROVIDER_SITE_OTHER): Payer: Medicare Other | Admitting: Family Medicine

## 2011-05-02 DIAGNOSIS — I2699 Other pulmonary embolism without acute cor pulmonale: Secondary | ICD-10-CM

## 2011-05-02 LAB — POCT INR: INR: 2.6

## 2011-05-02 NOTE — Patient Instructions (Signed)
Same dose 

## 2011-05-14 ENCOUNTER — Other Ambulatory Visit: Payer: Self-pay | Admitting: Dermatology

## 2011-05-28 ENCOUNTER — Ambulatory Visit (INDEPENDENT_AMBULATORY_CARE_PROVIDER_SITE_OTHER): Payer: Medicare Other | Admitting: Family Medicine

## 2011-05-28 DIAGNOSIS — I2699 Other pulmonary embolism without acute cor pulmonale: Secondary | ICD-10-CM

## 2011-05-28 LAB — POCT INR: INR: 2.9

## 2011-05-28 NOTE — Patient Instructions (Signed)
Same dose of coumadin. Check in 4 weeks

## 2011-06-01 ENCOUNTER — Encounter: Payer: Self-pay | Admitting: Family Medicine

## 2011-06-01 ENCOUNTER — Ambulatory Visit (INDEPENDENT_AMBULATORY_CARE_PROVIDER_SITE_OTHER): Payer: Medicare Other | Admitting: Family Medicine

## 2011-06-01 VITALS — BP 118/72 | HR 80 | Temp 98.0°F | Wt 215.0 lb

## 2011-06-01 DIAGNOSIS — R233 Spontaneous ecchymoses: Secondary | ICD-10-CM

## 2011-06-01 DIAGNOSIS — I2699 Other pulmonary embolism without acute cor pulmonale: Secondary | ICD-10-CM

## 2011-06-01 DIAGNOSIS — I82409 Acute embolism and thrombosis of unspecified deep veins of unspecified lower extremity: Secondary | ICD-10-CM

## 2011-06-10 ENCOUNTER — Encounter: Payer: Self-pay | Admitting: Family Medicine

## 2011-06-10 NOTE — Progress Notes (Signed)
  Subjective:    Patient ID: [name], male    DOB: 1933-06-19, 77 y.o.   MRN: [id number]  HPI Here to ask about a spot of bruising on the right wrist which has been present for one week. It is not painful, and he has no idea where it came from. His INR from 4 days ago was perfect at 2.9. He feels fine in general.    Review of Systems  Constitutional: Negative.   Respiratory: Negative.   Cardiovascular: Negative.   Musculoskeletal: Negative.   Skin: Positive for color change.       Objective:   Physical Exam  Constitutional: He appears well-developed and well-nourished.  Cardiovascular: Normal rate, regular rhythm, normal heart sounds and intact distal pulses.   Pulmonary/Chest: Effort normal and breath sounds normal.  Skin:       The dorsal right wrist has a macular area of ecchymosis about 3 cm in diameter. It is not swollen or tender          Assessment & Plan:  Reassured him that this is a simple area of bruising from broken capillaries. His Coumadin is therapeutic.

## 2011-06-19 ENCOUNTER — Encounter: Payer: Self-pay | Admitting: Endocrinology

## 2011-06-19 ENCOUNTER — Other Ambulatory Visit: Payer: Medicare Other

## 2011-06-19 ENCOUNTER — Ambulatory Visit (INDEPENDENT_AMBULATORY_CARE_PROVIDER_SITE_OTHER): Payer: Medicare Other | Admitting: Endocrinology

## 2011-06-19 VITALS — BP 112/70 | HR 74 | Temp 98.7°F | Ht 71.0 in | Wt 213.6 lb

## 2011-06-19 DIAGNOSIS — E210 Primary hyperparathyroidism: Secondary | ICD-10-CM

## 2011-06-19 NOTE — Progress Notes (Signed)
Subjective:    Patient ID: [name], male    DOB: [birth year], 77 y.o.   MRN: [id number]  HPI Pt is here to f/u primary hyperparathyroidism.  It improved with discontinuation of hctz, but has recurred since.  He denies numbness and muscle weakness.   Past Medical History  Diagnosis Date  . GERD (gastroesophageal reflux disease)   . Allergy   . Normal cardiac stress test     10-29-08  . Diverticulosis   . Sciatica   . ED (erectile dysfunction)   . BPH (benign prostatic hypertrophy)   . Osteoarthritis   . Headache   . Personal history of colonic polyps   . Hypertension   . Other and unspecified hyperlipidemia   . Hypercalcemia     sees Dr Loanne Drilling  . Pulmonary embolism     01-10-11  . DVT of lower extremity (deep venous thrombosis)     right leg, 01-10-11    Past Surgical History  Procedure Date  . Knee arthroscopy     rt knee  . Joint replacement     rt knee replacement 2003 Dr Wynelle Link  . Basal cell carcinoma excision     removed face back Dr Sherrye Payor, had MOHS Dr Sarajane Jews  . Tonsillectomy   . Hernia repair     Inguinal right Dr Rise Patience  3-05  . Knee arthroscopy     left knee 2011 Dr Wynelle Link     History   Social History  . Marital Status: Married    Spouse Name: N/A    Number of Children: N/A  . Years of Education: N/A   Occupational History  . retired    Social History Main Topics  . Smoking status: Former Smoker    Quit date: 11/26/1961  . Smokeless tobacco: Not on file  . Alcohol Use: Yes  . Drug Use: No  . Sexually Active: Not on file   Other Topics Concern  . Not on file   Social History Narrative   Daily caffeine use    Current Outpatient Prescriptions on File Prior to Visit  Medication Sig Dispense Refill  . aspirin 81 MG tablet Take 81 mg by mouth daily.        Marland Kitchen atorvastatin (LIPITOR) 10 MG tablet Take 1 tablet (10 mg total) by mouth daily.  90 tablet  3  . Bioflavonoid Products (BIOFLEX PO) Take by mouth daily.        .  celecoxib (CELEBREX) 200 MG capsule Take 200 mg by mouth 2 (two) times daily.       Marland Kitchen docusate sodium (COLACE) 100 MG capsule Take 1 capsule (100 mg total) by mouth daily.  90 capsule  3  . doxazosin (CARDURA) 4 MG tablet Take 1 tablet (4 mg total) by mouth at bedtime.  90 tablet  3  . dutasteride (AVODART) 0.5 MG capsule Take 1 capsule (0.5 mg total) by mouth every other day.  45 capsule  3  . fluticasone (FLONASE) 50 MCG/ACT nasal spray 2 sprays by Nasal route daily.  48 g  3  . furosemide (LASIX) 40 MG tablet Take 1 tablet (40 mg total) by mouth daily.  30 tablet  11  . pantoprazole (PROTONIX) 40 MG tablet Take 1 tablet (40 mg total) by mouth 2 (two) times daily.  180 tablet  3  . Psyllium (METAMUCIL) 30.9 % POWD Take by mouth. 1 tablespoon every day         . warfarin (COUMADIN) 5 MG tablet Take  10 mg by mouth daily.  Except on Mondays take 1 tablet       . enoxaparin (LOVENOX) 150 MG/ML injection Inject 0.67 mLs (100 mg total) into the skin every 12 (twelve) hours. B  1000 mL  2   No Known Allergies  Family History  Problem Relation Age of Onset  . Heart disease Mother   . Arthritis Other   . Cancer Other     Colon 1st degree relative <60   BP 112/70  Pulse 74  Temp(Src) 98.7 F (37.1 C) (Oral)  Ht 5\' 11"  (1.803 m)  Wt 213 lb 9.6 oz (96.888 kg)  BMI 29.79 kg/m2  SpO2 95%  Review of Systems Denies cramps    Objective:   Physical Exam GENERAL: no distress Msk: no deformity.    Ca++=10.2 Assessment & Plan:  Primary hyperparathyroidism, with improved ca++ recently.

## 2011-06-19 NOTE — Patient Instructions (Addendum)
blood tests are being ordered for you today.  please call [phone] to hear your test results.  You will be prompted to enter the 9-digit "MRN" number that appears at the top left of this page, followed by #.  Then you will hear the message. Please make a follow-up appointment in 6 months. Next time you have your coumadin checked, please make sure it is ok with them to take celebrex or naprosyn

## 2011-06-20 LAB — PTH, INTACT AND CALCIUM: Calcium, Total (PTH): 11.1 mg/dL — ABNORMAL HIGH (ref 8.4–10.5)

## 2011-06-26 ENCOUNTER — Telehealth: Payer: Self-pay

## 2011-06-26 NOTE — Telephone Encounter (Signed)
Pt called requesting results of Labs done 07/24. Results not on Phone Tree.

## 2011-06-27 NOTE — Telephone Encounter (Signed)
i called pt 06/27/11.  Left message.  High ca++ has recurred.  No med would help this.  Not high enough to need surg now. Ret 6 mos.

## 2011-06-29 ENCOUNTER — Encounter: Payer: Self-pay | Admitting: Family Medicine

## 2011-06-29 ENCOUNTER — Ambulatory Visit (INDEPENDENT_AMBULATORY_CARE_PROVIDER_SITE_OTHER): Payer: Medicare Other | Admitting: Family Medicine

## 2011-06-29 VITALS — BP 120/80 | HR 78 | Temp 98.0°F | Wt 217.0 lb

## 2011-06-29 DIAGNOSIS — I82409 Acute embolism and thrombosis of unspecified deep veins of unspecified lower extremity: Secondary | ICD-10-CM

## 2011-06-29 DIAGNOSIS — R609 Edema, unspecified: Secondary | ICD-10-CM

## 2011-06-29 DIAGNOSIS — Z86718 Personal history of other venous thrombosis and embolism: Secondary | ICD-10-CM

## 2011-06-29 DIAGNOSIS — I2699 Other pulmonary embolism without acute cor pulmonale: Secondary | ICD-10-CM

## 2011-06-29 NOTE — Patient Instructions (Signed)
Same dose,5 mg only on mondays then 10 mg on other days.Check in 4 weeks

## 2011-06-29 NOTE — Progress Notes (Signed)
  Subjective:    Patient ID: [name], male    DOB: 1933/03/26, 77 y.o.   MRN: [id number]  HPI Here to follow up on right leg DVT. He feels great and is still playing golf. His DVT was diagnosed on 01-10-11 so we are close to the 6 month stage. INR is therapeutic at 2.7.    Review of Systems  Constitutional: Negative.   Respiratory: Negative.   Cardiovascular: Negative.        Objective:   Physical Exam  Constitutional: He appears well-developed and well-nourished.  Cardiovascular: Normal rate, regular rhythm, normal heart sounds and intact distal pulses.   Pulmonary/Chest: Effort normal and breath sounds normal.  Musculoskeletal: He exhibits no tenderness.       Trace edema in both lower legs           Assessment & Plan:  We will set up another venous doppler soon.

## 2011-07-02 NOTE — Progress Notes (Signed)
Addended by: Alysia Penna A on: 07/02/2011 09:17 AM   Modules accepted: Orders

## 2011-07-04 ENCOUNTER — Encounter (INDEPENDENT_AMBULATORY_CARE_PROVIDER_SITE_OTHER): Payer: Medicare Other | Admitting: Cardiology

## 2011-07-04 DIAGNOSIS — I82409 Acute embolism and thrombosis of unspecified deep veins of unspecified lower extremity: Secondary | ICD-10-CM

## 2011-07-04 DIAGNOSIS — Z86718 Personal history of other venous thrombosis and embolism: Secondary | ICD-10-CM

## 2011-07-04 DIAGNOSIS — I8010 Phlebitis and thrombophlebitis of unspecified femoral vein: Secondary | ICD-10-CM

## 2011-07-05 ENCOUNTER — Encounter: Payer: Self-pay | Admitting: Family Medicine

## 2011-07-06 NOTE — Progress Notes (Signed)
Addended by: Alysia Penna A on: 07/06/2011 08:48 AM   Modules accepted: Orders

## 2011-07-06 NOTE — Progress Notes (Signed)
Spoke with pt and gave results. 

## 2011-07-06 NOTE — Progress Notes (Signed)
Left message with results and pt may call back.

## 2011-07-09 ENCOUNTER — Encounter: Payer: Self-pay | Admitting: Vascular Surgery

## 2011-07-16 ENCOUNTER — Ambulatory Visit (INDEPENDENT_AMBULATORY_CARE_PROVIDER_SITE_OTHER): Payer: Medicare Other | Admitting: Vascular Surgery

## 2011-07-16 ENCOUNTER — Encounter: Payer: Self-pay | Admitting: Vascular Surgery

## 2011-07-16 VITALS — BP 130/68 | HR 65 | Ht 71.0 in | Wt 215.0 lb

## 2011-07-16 DIAGNOSIS — I82509 Chronic embolism and thrombosis of unspecified deep veins of unspecified lower extremity: Secondary | ICD-10-CM

## 2011-07-16 NOTE — Progress Notes (Addendum)
Subjective:     Patient ID: [name], male   DOB: [birth year], 77 y.o.   MRN: [id number]  HPI this 77 year old male patient was referred by Dr. Sarajane Jews for an opinion regarding chronic Coumadin therapy. This patient developed a DVT in his right leg in February of 2012. He injured his right leg in December of 2012 bumping it on something in his kitchen. He was also found to have pulmonary emboli at the time the DVT was diagnosed. He denies any pulmonary symptoms such as hemoptysis chest pain et Ronney Asters. He has been on Coumadin therapy since that time with an INR around 2.7. He states he has had no swelling in the leg for the last several months and does not wear elastic compression stockings. He has not previously had any blood clots in his legs.  [facility] has [facility] May and August of this year. I have read and reviewed these reports. He continues to have some residual partially occlusive DVT in the right S. the there has been some recanalization of the flow channel in the right SFV. There is some thickening of the walls. Past Medical History  Diagnosis Date  . GERD (gastroesophageal reflux disease)   . Allergy   . Normal cardiac stress test     10-29-08  . Diverticulosis   . Sciatica   . ED (erectile dysfunction)   . BPH (benign prostatic hypertrophy)   . Osteoarthritis   . Headache   . Personal history of colonic polyps   . Hypertension   . Other and unspecified hyperlipidemia   . Hypercalcemia     sees Dr Loanne Drilling  . Pulmonary embolism     01-10-11  . DVT of lower extremity (deep venous thrombosis)     right leg, 01-10-11    History  Substance Use Topics  . Smoking status: Former Smoker    Quit date: 11/26/1961  . Smokeless tobacco: Not on file  . Alcohol Use: 1.2 oz/week    2 Glasses of wine per week    Family History  Problem Relation Age of Onset  . Heart disease Mother   . Arthritis Other   . Cancer Other     Colon 1st degree  relative <60    No Known Allergies  Current outpatient prescriptions:aspirin 81 MG tablet, Take 81 mg by mouth daily.  , Disp: , Rfl: ;  atorvastatin (LIPITOR) 10 MG tablet, Take 1 tablet (10 mg total) by mouth daily., Disp: 90 tablet, Rfl: 3;  Bioflavonoid Products (BIOFLEX PO), Take by mouth daily.  , Disp: , Rfl: ;  docusate sodium (COLACE) 100 MG capsule, Take 1 capsule (100 mg total) by mouth daily., Disp: 90 capsule, Rfl: 3 doxazosin (CARDURA) 4 MG tablet, Take 1 tablet (4 mg total) by mouth at bedtime., Disp: 90 tablet, Rfl: 3;  dutasteride (AVODART) 0.5 MG capsule, Take 1 capsule (0.5 mg total) by mouth every other day., Disp: 45 capsule, Rfl: 3;  fluticasone (FLONASE) 50 MCG/ACT nasal spray, 2 sprays by Nasal route daily., Disp: 48 g, Rfl: 3;  furosemide (LASIX) 40 MG tablet, Take 1 tablet (40 mg total) by mouth daily., Disp: 30 tablet, Rfl: 11 naproxen sodium (ANAPROX) 220 MG tablet, Take 220 mg by mouth 2 (two) times daily. Take 2 tabs twice a day , Disp: , Rfl: ;  pantoprazole (PROTONIX) 40 MG tablet, Take 1 tablet (40 mg total) by mouth 2 (two) times daily., Disp: 180 tablet, Rfl: 3;  polyethylene glycol powder (GLYCOLAX/MIRALAX) powder, Take 17 g by mouth daily.  , Disp: , Rfl: ;  Psyllium (METAMUCIL) 30.9 % POWD, Take by mouth. 1 tablespoon every day   , Disp: , Rfl:  warfarin (COUMADIN) 5 MG tablet, Take 10 mg by mouth daily.  Except on Mondays take 1 tablet , Disp: , Rfl: ;  celecoxib (CELEBREX) 200 MG capsule, Take 200 mg by mouth 2 (two) times daily. , Disp: , Rfl: ;  enoxaparin (LOVENOX) 150 MG/ML injection, Inject 0.67 mLs (100 mg total) into the skin every 12 (twelve) hours. B, Disp: 1000 mL, Rfl: 2  Filed Vitals:   07/16/11 1207  Height: 5\' 11"  (1.803 m)  Weight: 215 lb (97.523 kg)    Body mass index is 29.99 kg/(m^2).       Review of Systems  Constitutional: Negative for fever, chills and appetite change.  HENT: Negative for hearing loss, nosebleeds, congestion, sore  throat, trouble swallowing and voice change.   Eyes: Negative for photophobia and visual disturbance.  Respiratory: Negative for cough, chest tightness, shortness of breath and wheezing.   Cardiovascular: Negative for chest pain, palpitations and leg swelling.  Gastrointestinal: Negative for nausea, vomiting, abdominal pain, diarrhea, constipation, blood in stool and abdominal distention.  Genitourinary: Negative for urgency, frequency, hematuria and difficulty urinating.  Musculoskeletal: Negative for back pain, joint swelling, arthralgias and gait problem.  Skin: Negative for pallor, rash and wound.  Neurological: Negative for dizziness, syncope, facial asymmetry, speech difficulty, weakness, light-headedness, numbness and headaches.  Hematological: Negative for adenopathy.  Psychiatric/Behavioral: Negative for confusion.       Objective:   Physical Exam  Constitutional: He is oriented to person, place, and time. He appears well-developed and well-nourished. No distress.  HENT:  Head: Normocephalic.  Mouth/Throat: Oropharynx is clear and moist.  Eyes: EOM are normal.  Neck: Normal range of motion. Neck supple.  Cardiovascular: Normal rate, regular rhythm and intact distal pulses.   No murmur heard.      Both legs have palpable dorsalis pedis pulses 3+ on the right 2+ on the left he has no edema in either lower . Extremity. There are no significant varicose veins or evidence of stasis ulcers or hyperpigmentation.  Pulmonary/Chest: Effort normal and breath sounds normal. No respiratory distress. He has no wheezes. He has no rales.  Abdominal: Soft. He exhibits no distension and no mass. There is no tenderness. There is no rebound and no guarding.  Musculoskeletal: Normal range of motion. He exhibits no edema.  Lymphadenopathy:    He has no cervical adenopathy.  Neurological: He is alert and oriented to person, place, and time. Coordination normal.  Skin: Skin is warm and dry. No rash  noted.  Psychiatric: His behavior is normal.   blood pressure 130/68 heart rate 65 respirations 14.     Assessment:     Status post DVT right superficial femoral vein which is resolving. Patient has been on Coumadin for 6 months. Do not think that he will have any further resolution of the chronic findings in his venous system of the right leg. I would recommend continuing Coumadin therapy for 6 months and in discontinuing it unless he has any other thrombotic issues in the interim.    Plan:     DC Coumadin in 6 months and take daily aspirin.   if patient develops recurrent swelling of either lower extremity would obtain a duplex scan.

## 2011-07-23 ENCOUNTER — Ambulatory Visit (INDEPENDENT_AMBULATORY_CARE_PROVIDER_SITE_OTHER): Payer: Medicare Other | Admitting: Family Medicine

## 2011-07-23 ENCOUNTER — Encounter: Payer: Self-pay | Admitting: Family Medicine

## 2011-07-23 VITALS — BP 122/78 | HR 101 | Temp 98.6°F | Wt 217.0 lb

## 2011-07-23 DIAGNOSIS — I82409 Acute embolism and thrombosis of unspecified deep veins of unspecified lower extremity: Secondary | ICD-10-CM

## 2011-07-23 DIAGNOSIS — I2699 Other pulmonary embolism without acute cor pulmonale: Secondary | ICD-10-CM

## 2011-07-23 NOTE — Progress Notes (Signed)
  Subjective:    Patient ID: [name], male    DOB: 1933-05-28, 77 y.o.   MRN: [id number]  HPI Here to follow up on a DVT in the right leg. He saw Dr. Tinnie Gens on 07-16-11, and he felt the residual thrombus looked chronic and he doubted that this will ever completely dissolve. He recommended that we continue Coumadin for another 6 months (for a total of 12 months), and then we could stop it. Frankey Poot feels good, still playing golf.    Review of Systems  Constitutional: Negative.   Cardiovascular: Negative for leg swelling.  Musculoskeletal: Negative for myalgias.       Objective:   Physical Exam  Constitutional: He appears well-developed and well-nourished.  Musculoskeletal: He exhibits no edema and no tenderness.          Assessment & Plan:  Stay on Coumadin as above. Recheck an INR in 4 weeks.

## 2011-08-20 ENCOUNTER — Encounter: Payer: Self-pay | Admitting: Family Medicine

## 2011-08-20 ENCOUNTER — Ambulatory Visit (INDEPENDENT_AMBULATORY_CARE_PROVIDER_SITE_OTHER): Payer: Medicare Other | Admitting: Family Medicine

## 2011-08-20 VITALS — BP 124/78 | HR 96 | Temp 98.4°F | Wt 219.0 lb

## 2011-08-20 DIAGNOSIS — I82409 Acute embolism and thrombosis of unspecified deep veins of unspecified lower extremity: Secondary | ICD-10-CM

## 2011-08-20 DIAGNOSIS — I2699 Other pulmonary embolism without acute cor pulmonale: Secondary | ICD-10-CM

## 2011-08-20 NOTE — Progress Notes (Signed)
  Subjective:    Patient ID: [name], male    DOB: [birth year], 77 y.o.   MRN: [id number]  HPI Here to follow up a DVT. He feels great, and is quite active. His INR today is 3.1 and is on target. No leg pain.  Review of Systems  Constitutional: Negative.   Respiratory: Negative.   Musculoskeletal: Negative.        Objective:   Physical Exam  Constitutional: He appears well-developed and well-nourished.  Cardiovascular: Normal rate, regular rhythm, normal heart sounds and intact distal pulses.   Pulmonary/Chest: Effort normal and breath sounds normal.  Musculoskeletal: He exhibits no edema and no tenderness.          Assessment & Plan:  Stay on current regimen. Recheck in one month

## 2011-08-21 ENCOUNTER — Other Ambulatory Visit: Payer: Self-pay | Admitting: Family Medicine

## 2011-09-17 ENCOUNTER — Encounter: Payer: Self-pay | Admitting: Family Medicine

## 2011-09-17 ENCOUNTER — Ambulatory Visit (INDEPENDENT_AMBULATORY_CARE_PROVIDER_SITE_OTHER): Payer: Medicare Other | Admitting: Family Medicine

## 2011-09-17 VITALS — BP 124/76 | HR 107 | Temp 98.8°F | Wt 218.0 lb

## 2011-09-17 DIAGNOSIS — I82409 Acute embolism and thrombosis of unspecified deep veins of unspecified lower extremity: Secondary | ICD-10-CM

## 2011-09-17 DIAGNOSIS — I2699 Other pulmonary embolism without acute cor pulmonale: Secondary | ICD-10-CM

## 2011-09-17 NOTE — Progress Notes (Signed)
  Subjective:    Patient ID: [name], male    DOB: 19-Jun-1933, 77 y.o.   MRN: [id number]  HPI Here to follow up a DVT. He feels fine and still plays golf. His INR is low today but admits to missing a few doses in the past week.    Review of Systems  Constitutional: Negative.   Respiratory: Negative.   Cardiovascular: Negative.        Objective:   Physical Exam  Constitutional: He appears well-developed and well-nourished.  Cardiovascular: Normal rate, regular rhythm, normal heart sounds and intact distal pulses.   Pulmonary/Chest: Breath sounds normal.          Assessment & Plan:  We will increase the Coumadin dose to 10 mg daily. Recheck in one week

## 2011-09-24 ENCOUNTER — Ambulatory Visit: Payer: Medicare Other

## 2011-09-24 DIAGNOSIS — I2699 Other pulmonary embolism without acute cor pulmonale: Secondary | ICD-10-CM

## 2011-09-24 NOTE — Patient Instructions (Signed)
  Latest dosing instructions   Total Sun Mon Tue Wed Thu Fri Sat   70 10 mg 10 mg 10 mg 10 mg 10 mg 10 mg 10 mg    (5 mg2) (5 mg2) (5 mg2) (5 mg2) (5 mg2) (5 mg2) (5 mg2)

## 2011-10-08 ENCOUNTER — Ambulatory Visit (INDEPENDENT_AMBULATORY_CARE_PROVIDER_SITE_OTHER): Payer: Medicare Other | Admitting: Family Medicine

## 2011-10-08 ENCOUNTER — Encounter: Payer: Self-pay | Admitting: Family Medicine

## 2011-10-08 ENCOUNTER — Telehealth: Payer: Self-pay | Admitting: Family Medicine

## 2011-10-08 VITALS — BP 124/80 | HR 93 | Temp 99.1°F | Wt 215.0 lb

## 2011-10-08 DIAGNOSIS — I2699 Other pulmonary embolism without acute cor pulmonale: Secondary | ICD-10-CM

## 2011-10-08 DIAGNOSIS — Z87898 Personal history of other specified conditions: Secondary | ICD-10-CM

## 2011-10-08 DIAGNOSIS — I82409 Acute embolism and thrombosis of unspecified deep veins of unspecified lower extremity: Secondary | ICD-10-CM

## 2011-10-08 DIAGNOSIS — I8390 Asymptomatic varicose veins of unspecified lower extremity: Secondary | ICD-10-CM

## 2011-10-08 NOTE — Progress Notes (Signed)
  Subjective:    Patient ID: [name], male    DOB: 1933/03/02, 77 y.o.   MRN: [id number]  HPI Here to check his right lower leg. Last week one day he was on his feet all day going up and down steps at some meetings. The next morning the right lower leg was swollen but not painful. Over the next 2 days the swelling went down again. Today he feels fine.    Review of Systems  Constitutional: Negative.   Respiratory: Negative.   Cardiovascular: Positive for leg swelling. Negative for chest pain and palpitations.       Objective:   Physical Exam  Constitutional: He appears well-developed and well-nourished.  Musculoskeletal:       No edema. The right lower leg has several superficial varicose veins that are not tender.           Assessment & Plan:  He probably had some swelling of the varicose vein in the leg which has resolved. This is benign. Recheck prn

## 2011-10-08 NOTE — Telephone Encounter (Signed)
Pt need ct chest week of 01-06-2011. Please send order to [facility]

## 2011-10-09 NOTE — Telephone Encounter (Signed)
This does not make sense. We talked about getting a 6 month follow up chest CT from the one taken on 04-16-11. That would be later this month. Does he want one at the end of November?

## 2011-10-09 NOTE — Telephone Encounter (Signed)
Pt wanted to know if the CT was due in Feb. 2013, is this correct?

## 2011-10-10 NOTE — Telephone Encounter (Signed)
The chest CT was taken in May, so the 6 month follow up chest CT should be later this month. The doppler US on his leg was taken in August, so a 6 month follow up leg doppler would be in Feb. 2013

## 2011-10-12 NOTE — Telephone Encounter (Signed)
Pt informed, future CT chest ordered

## 2011-10-15 ENCOUNTER — Ambulatory Visit: Payer: Medicare Other

## 2011-10-15 ENCOUNTER — Encounter: Payer: Self-pay | Admitting: Family Medicine

## 2011-10-15 ENCOUNTER — Ambulatory Visit (INDEPENDENT_AMBULATORY_CARE_PROVIDER_SITE_OTHER): Payer: Medicare Other | Admitting: Family Medicine

## 2011-10-15 ENCOUNTER — Ambulatory Visit: Payer: Self-pay | Admitting: Family Medicine

## 2011-10-15 VITALS — BP 130/80 | HR 96 | Temp 99.4°F | Wt 214.0 lb

## 2011-10-15 DIAGNOSIS — J984 Other disorders of lung: Secondary | ICD-10-CM

## 2011-10-15 DIAGNOSIS — I2699 Other pulmonary embolism without acute cor pulmonale: Secondary | ICD-10-CM

## 2011-10-15 DIAGNOSIS — I82409 Acute embolism and thrombosis of unspecified deep veins of unspecified lower extremity: Secondary | ICD-10-CM

## 2011-10-15 DIAGNOSIS — R911 Solitary pulmonary nodule: Secondary | ICD-10-CM

## 2011-10-15 NOTE — Progress Notes (Signed)
  Subjective:    Patient ID: [name], male    DOB: 1933/08/18, 77 y.o.   MRN: [id number]  HPI Here asking to check his right lower leg and for a Coumadin check. Several weeks ago we increased his dose slightly, and today the INR is up to 4.4. He had some swelling in the leg over the weekend because he was on his feet a lot, but today it is back down. No pain.    Review of Systems  Constitutional: Negative.   Respiratory: Negative.   Cardiovascular: Negative.        Objective:   Physical Exam  Constitutional: He appears well-developed and well-nourished.  Musculoskeletal: He exhibits no edema and no tenderness.          Assessment & Plan:  We will hold the Coumadin for 2 days , then start taking 5 mg on Mondays and Fridays and 10 mg on other days. Recheck INR in one week

## 2011-10-15 NOTE — Progress Notes (Signed)
Addended by: Aggie Hacker A on: 10/15/2011 10:01 AM   Modules accepted: Orders

## 2011-10-16 ENCOUNTER — Ambulatory Visit: Payer: Self-pay

## 2011-10-22 ENCOUNTER — Ambulatory Visit (INDEPENDENT_AMBULATORY_CARE_PROVIDER_SITE_OTHER): Payer: Medicare Other | Admitting: Family Medicine

## 2011-10-22 ENCOUNTER — Other Ambulatory Visit: Payer: Self-pay | Admitting: Dermatology

## 2011-10-22 DIAGNOSIS — I2699 Other pulmonary embolism without acute cor pulmonale: Secondary | ICD-10-CM

## 2011-10-22 LAB — POCT INR: INR: 2.4

## 2011-10-22 NOTE — Patient Instructions (Signed)
  Latest dosing instructions   Total Dorene Grebe Tue Wed Thu Fri Sat   60 10 mg 5 mg 10 mg 10 mg 10 mg 5 mg 10 mg    (5 mg2) (5 mg1) (5 mg2) (5 mg2) (5 mg2) (5 mg1) (5 mg2)

## 2011-10-26 ENCOUNTER — Ambulatory Visit (INDEPENDENT_AMBULATORY_CARE_PROVIDER_SITE_OTHER)
Admission: RE | Admit: 2011-10-26 | Discharge: 2011-10-26 | Disposition: A | Discharge status: Home / Self Care | Payer: Medicare Other | Source: Ambulatory Visit | Attending: Family Medicine | Admitting: Family Medicine

## 2011-10-26 DIAGNOSIS — R911 Solitary pulmonary nodule: Secondary | ICD-10-CM

## 2011-10-26 DIAGNOSIS — J984 Other disorders of lung: Secondary | ICD-10-CM

## 2011-10-31 ENCOUNTER — Telehealth: Payer: Self-pay | Admitting: Family Medicine

## 2011-10-31 NOTE — Telephone Encounter (Signed)
Pt called req results from CT Scan. Pls call.

## 2011-11-01 NOTE — Progress Notes (Signed)
Quick Note:  Spoke with pt ______ 

## 2011-11-05 ENCOUNTER — Ambulatory Visit (INDEPENDENT_AMBULATORY_CARE_PROVIDER_SITE_OTHER): Payer: Medicare Other | Admitting: Family Medicine

## 2011-11-05 DIAGNOSIS — I2699 Other pulmonary embolism without acute cor pulmonale: Secondary | ICD-10-CM

## 2011-11-05 NOTE — Patient Instructions (Signed)
  Latest dosing instructions   Total Dorene Grebe Tue Wed Thu Fri Sat   60 10 mg 5 mg 10 mg 10 mg 10 mg 5 mg 10 mg    (5 mg2) (5 mg1) (5 mg2) (5 mg2) (5 mg2) (5 mg1) (5 mg2)

## 2011-11-12 ENCOUNTER — Ambulatory Visit (INDEPENDENT_AMBULATORY_CARE_PROVIDER_SITE_OTHER): Payer: Medicare Other | Admitting: Family Medicine

## 2011-11-12 ENCOUNTER — Encounter: Payer: Self-pay | Admitting: Family Medicine

## 2011-11-12 VITALS — BP 148/86 | HR 107 | Temp 98.8°F | Wt 217.0 lb

## 2011-11-12 DIAGNOSIS — I82409 Acute embolism and thrombosis of unspecified deep veins of unspecified lower extremity: Secondary | ICD-10-CM

## 2011-11-12 DIAGNOSIS — I2699 Other pulmonary embolism without acute cor pulmonale: Secondary | ICD-10-CM

## 2011-11-12 NOTE — Patient Instructions (Signed)
  Latest dosing instructions   Total Dorene Grebe Tue Wed Thu Fri Sat   45 5 mg 10 mg 5 mg 5 mg 5 mg 10 mg 5 mg    (5 mg1) (5 mg2) (5 mg1) (5 mg1) (5 mg1) (5 mg2) (5 mg1)

## 2011-11-12 NOTE — Progress Notes (Signed)
  Subjective:    Patient ID: [name], male    DOB: [birth year], 77 y.o.   MRN: [id number]  HPI Doing well in general. His INR today is 4.7, so we will hold the Coumadin for 2 days. After that go on 5 mg every day except 10 mg on Mon and Fri. Recheck in 2 weeks. He has some swelling in both legs after he has been on his feet a lot, but this goes down every night. His recent chest CT showed no significant lesions and no PEs.    Review of Systems  Constitutional: Negative.   Respiratory: Negative.   Cardiovascular: Positive for leg swelling. Negative for chest pain and palpitations.       Objective:   Physical Exam  Constitutional: He appears well-developed and well-nourished. No distress.  Cardiovascular: Normal rate, regular rhythm, normal heart sounds and intact distal pulses.   Pulmonary/Chest: Effort normal and breath sounds normal.  Musculoskeletal: He exhibits no edema and no tenderness.          Assessment & Plan:  Adjust Coumadin as above.

## 2011-11-16 IMAGING — CR DG CHEST 2V
2 series · 2 of 2 positions shown · non-contrast
Comparison: Chest 01/26/2004.

CLINICAL DATA: Hypercalcemia.

CHEST - 2 VIEW

[view not recorded (1 of 2)]
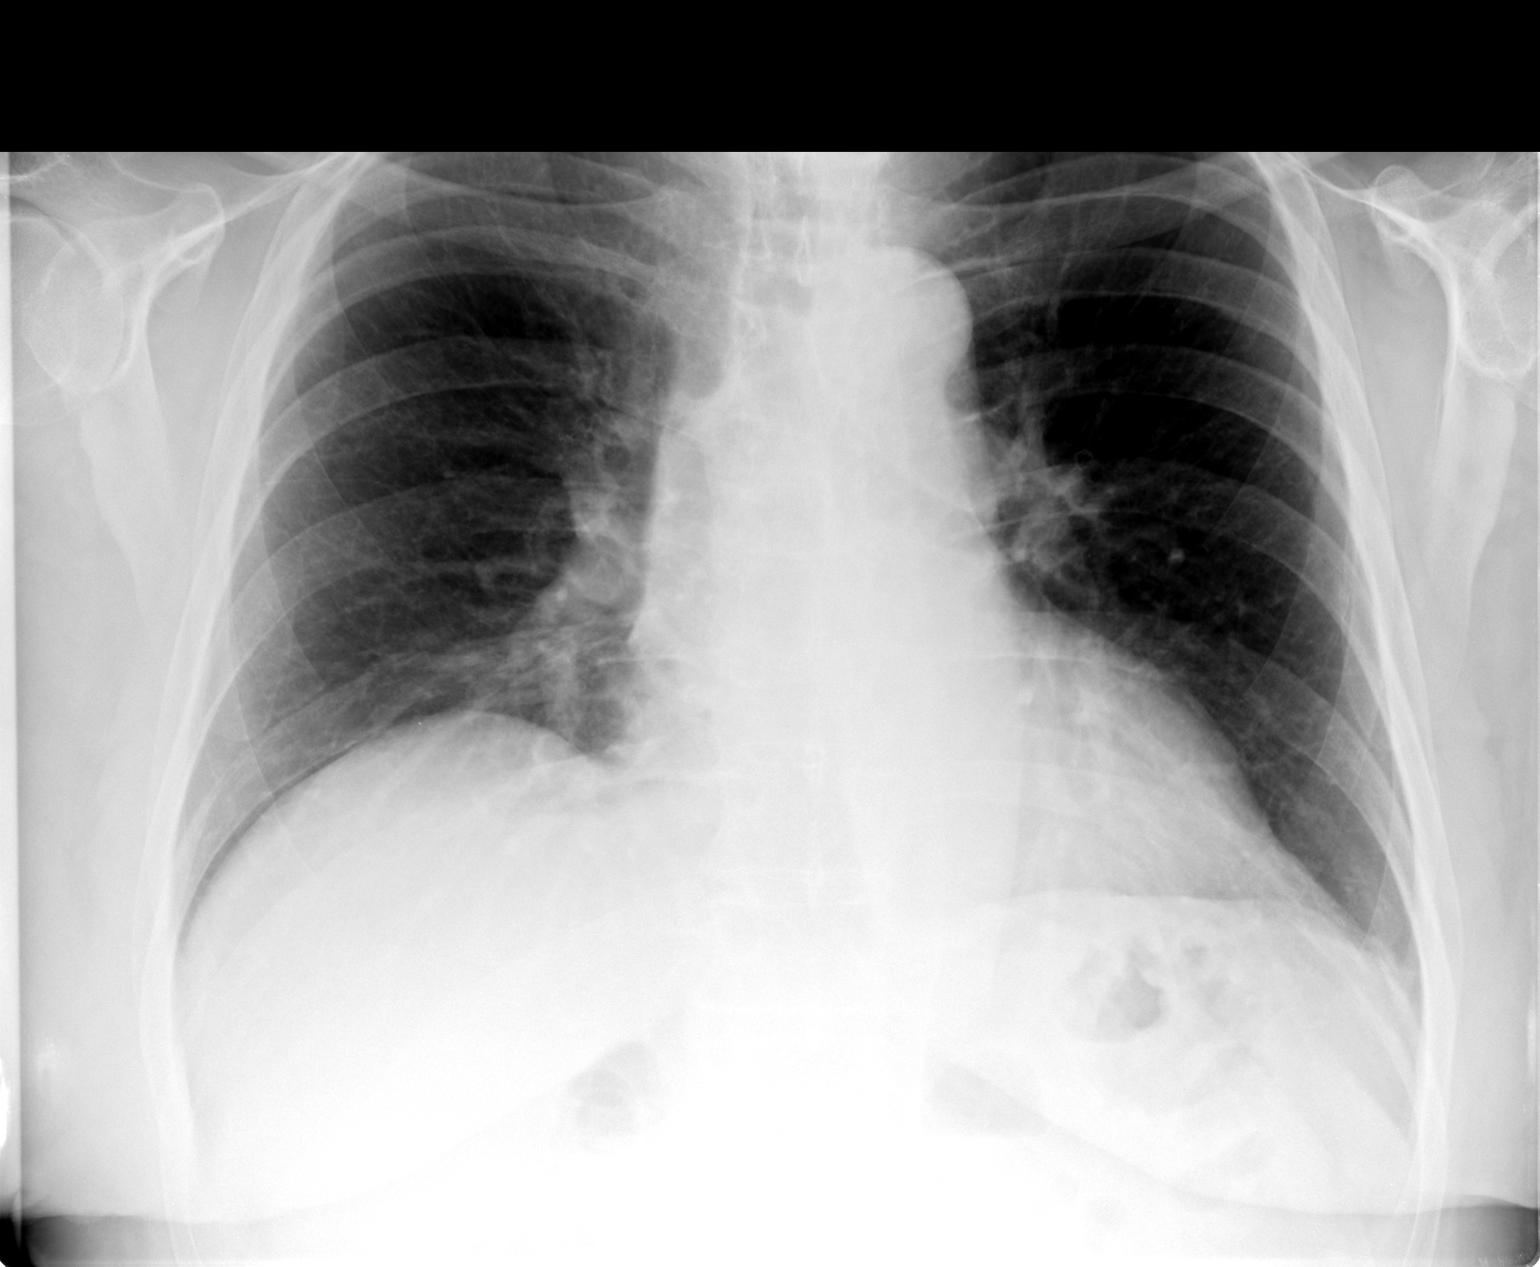

[view not recorded (2 of 2)]
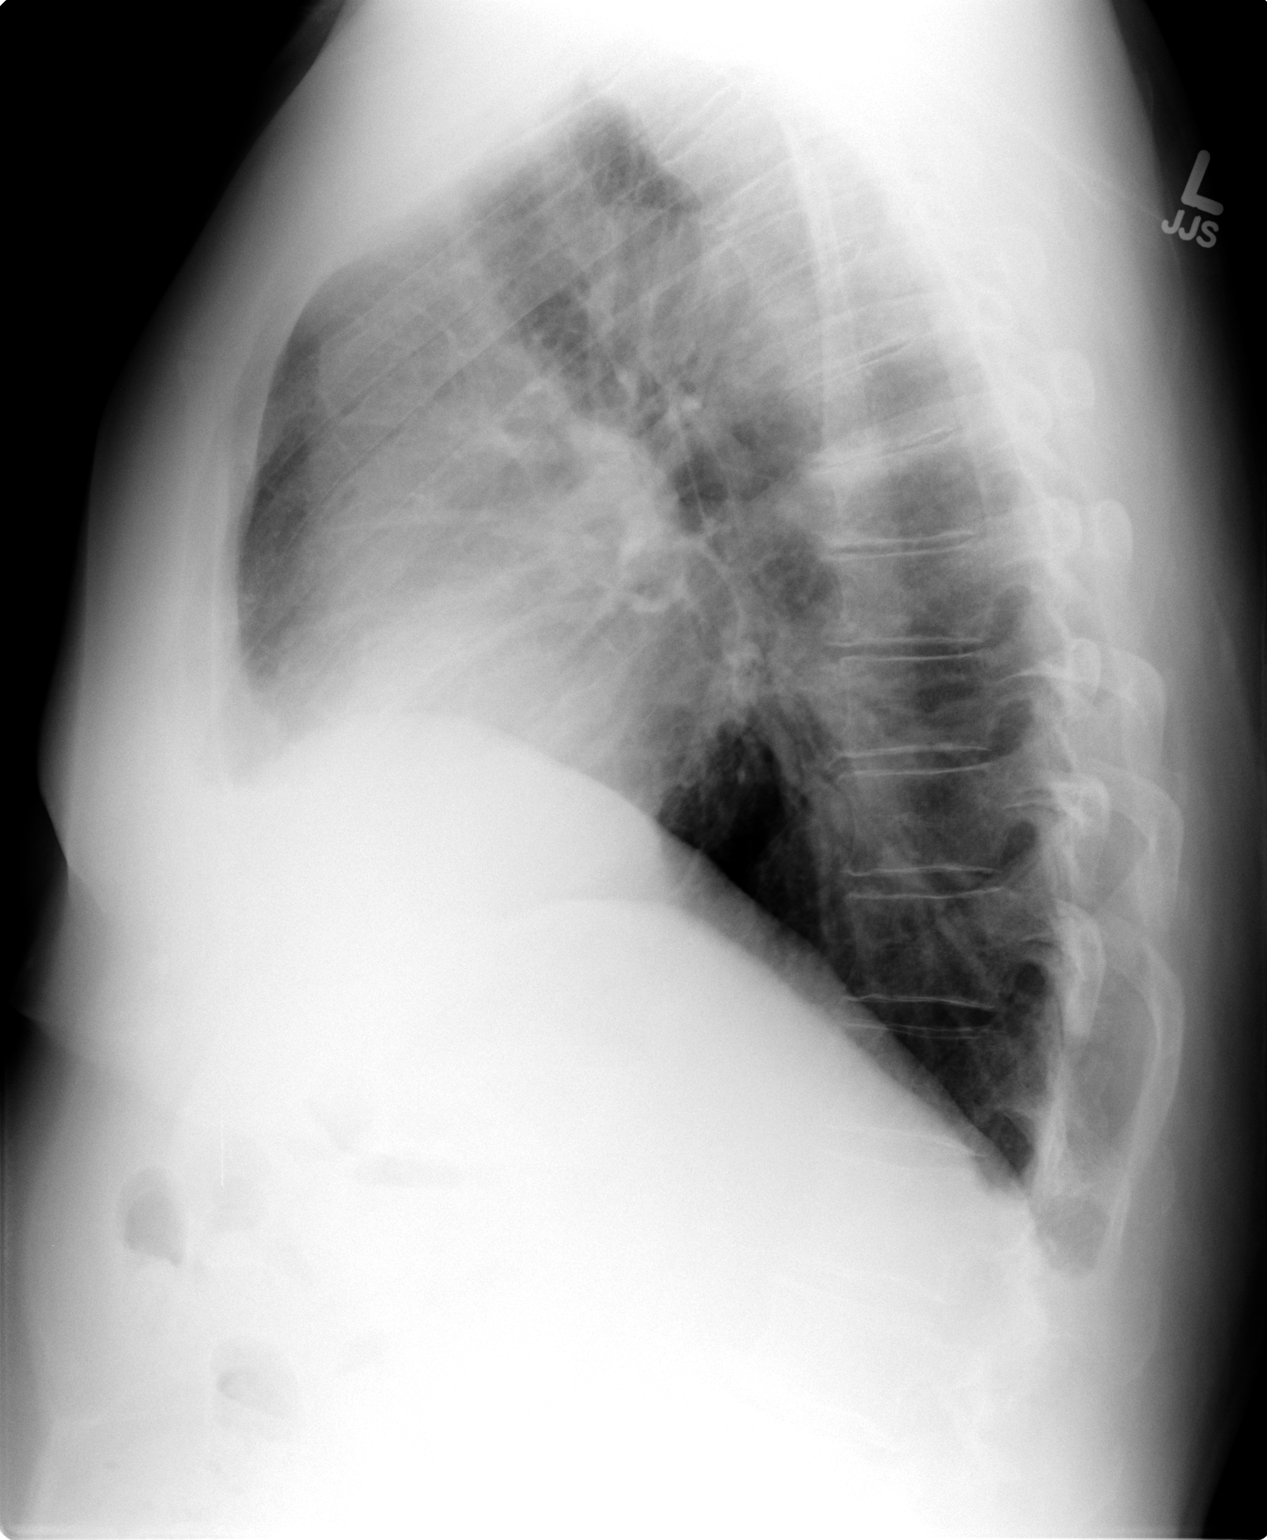

[2 of 2 positions shown; findings below may reference images not displayed]

FINDINGS: Minimal scarring is seen in the left lung base.  Lungs
otherwise clear.  Heart size upper normal.  Mild elevation of the
right hemidiaphragm noted.
IMPRESSION: No acute disease.  Stable to prior exam.

## 2011-11-26 ENCOUNTER — Ambulatory Visit: Payer: Medicare Other

## 2011-11-26 DIAGNOSIS — I2699 Other pulmonary embolism without acute cor pulmonale: Secondary | ICD-10-CM

## 2011-11-26 LAB — POCT INR: INR: 2.3

## 2011-11-26 NOTE — Patient Instructions (Signed)
  Latest dosing instructions   Total [name] Tue Wed Thu Fri Sat   50 5 mg 10 mg 5 mg 10 mg 5 mg 10 mg 5 mg    (5 mg1) (5 mg2) (5 mg1) (5 mg2) (5 mg1) (5 mg2) (5 mg1)

## 2011-12-06 ENCOUNTER — Encounter: Payer: Self-pay | Admitting: Family Medicine

## 2011-12-06 ENCOUNTER — Ambulatory Visit (INDEPENDENT_AMBULATORY_CARE_PROVIDER_SITE_OTHER): Payer: Medicare Other | Admitting: Family Medicine

## 2011-12-06 VITALS — BP 136/84 | HR 114 | Temp 98.8°F | Wt 216.0 lb

## 2011-12-06 DIAGNOSIS — I8390 Asymptomatic varicose veins of unspecified lower extremity: Secondary | ICD-10-CM

## 2011-12-06 DIAGNOSIS — D1779 Benign lipomatous neoplasm of other sites: Secondary | ICD-10-CM | POA: Diagnosis not present

## 2011-12-06 DIAGNOSIS — D1720 Benign lipomatous neoplasm of skin and subcutaneous tissue of unspecified limb: Secondary | ICD-10-CM

## 2011-12-06 DIAGNOSIS — I82409 Acute embolism and thrombosis of unspecified deep veins of unspecified lower extremity: Secondary | ICD-10-CM | POA: Diagnosis not present

## 2011-12-06 NOTE — Progress Notes (Signed)
  Subjective:    Patient ID: [name], male    DOB: 01/01/1933, 78 y.o.   MRN: [id number]  HPI Here asking about a lump on the right lower leg that he noticed 3 days ago after playing 9 holes of golf. His legs always swell a bit after playing golf and they go back down. The lump is smaller now but still there. He cannot remember if it was there before this day or not. It is not painful. He is still on Coumadin for a DVT.    Review of Systems  Constitutional: Negative.   Respiratory: Negative.   Cardiovascular: Positive for leg swelling. Negative for chest pain and palpitations.       Objective:   Physical Exam  Constitutional: He appears well-developed and well-nourished.  Cardiovascular: Normal rate, regular rhythm, normal heart sounds and intact distal pulses.   Pulmonary/Chest: Effort normal and breath sounds normal.  Musculoskeletal:       The lump in question is just below the surface of the skin on the medial right lower leg. It is firm, well circumscribed, and not tender. There are several varicosities elsewhere on the leg          Assessment & Plan:  Reassured him that this is benign and not another blood clot. Recheck at his cpx next month

## 2011-12-17 ENCOUNTER — Ambulatory Visit (INDEPENDENT_AMBULATORY_CARE_PROVIDER_SITE_OTHER): Payer: Medicare Other | Admitting: Family Medicine

## 2011-12-17 ENCOUNTER — Ambulatory Visit: Payer: Self-pay | Admitting: Family Medicine

## 2011-12-17 ENCOUNTER — Ambulatory Visit: Payer: Self-pay | Admitting: Endocrinology

## 2011-12-17 DIAGNOSIS — I2699 Other pulmonary embolism without acute cor pulmonale: Secondary | ICD-10-CM | POA: Diagnosis not present

## 2011-12-17 LAB — POCT INR: INR: 2.5

## 2011-12-17 NOTE — Patient Instructions (Signed)
  Latest dosing instructions   Total [name] Tue Wed Thu Fri Sat   50 5 mg 10 mg 5 mg 10 mg 5 mg 10 mg 5 mg    (5 mg1) (5 mg2) (5 mg1) (5 mg2) (5 mg1) (5 mg2) (5 mg1)

## 2011-12-19 ENCOUNTER — Other Ambulatory Visit: Payer: Medicare Other

## 2011-12-19 ENCOUNTER — Ambulatory Visit (INDEPENDENT_AMBULATORY_CARE_PROVIDER_SITE_OTHER): Payer: Medicare Other | Admitting: Endocrinology

## 2011-12-19 ENCOUNTER — Encounter: Payer: Self-pay | Admitting: Endocrinology

## 2011-12-19 VITALS — BP 128/78 | HR 75 | Temp 98.2°F | Ht 71.0 in | Wt 217.6 lb

## 2011-12-19 DIAGNOSIS — E210 Primary hyperparathyroidism: Secondary | ICD-10-CM

## 2011-12-19 NOTE — Progress Notes (Signed)
Subjective:    Patient ID: [name], male    DOB: 1933-06-29, 78 y.o.   MRN: [id number]  HPI Pt is here to f/u primary hyperparathyroidism (2009.  It improved with discontinuation of hctz, but has recurred since.  He denies numbness and muscle weakness.  He is almost done with his course of coumadin.   Past Medical History  Diagnosis Date  . GERD (gastroesophageal reflux disease)   . Allergy   . Normal cardiac stress test     10-29-08  . Diverticulosis   . Sciatica   . ED (erectile dysfunction)   . BPH (benign prostatic hypertrophy)   . Osteoarthritis   . Headache   . Personal history of colonic polyps   . Hypertension   . Other and unspecified hyperlipidemia   . Hypercalcemia     sees Dr Loanne Drilling  . Pulmonary embolism     01-10-11  . DVT of lower extremity (deep venous thrombosis)     right leg, 01-10-11    Past Surgical History  Procedure Date  . Knee arthroscopy     rt knee  . Joint replacement     rt knee replacement 2003 Dr Wynelle Link  . Basal cell carcinoma excision     removed face back Dr Sherrye Payor, had MOHS Dr Sarajane Jews  . Tonsillectomy   . Hernia repair     Inguinal right Dr Rise Patience  3-05  . Knee arthroscopy     left knee 2011 Dr Wynelle Link     History   Social History  . Marital Status: Married    Spouse Name: N/A    Number of Children: N/A  . Years of Education: N/A   Occupational History  . retired    Social History Main Topics  . Smoking status: Former Smoker    Quit date: 11/26/1961  . Smokeless tobacco: Never Used  . Alcohol Use: 1.8 oz/week    3 Glasses of wine per week  . Drug Use: No  . Sexually Active: Not on file   Other Topics Concern  . Not on file   Social History Narrative   Daily caffeine use    Current Outpatient Prescriptions on File Prior to Visit  Medication Sig Dispense Refill  . aspirin 81 MG tablet Take 81 mg by mouth daily.        Marland Kitchen atorvastatin (LIPITOR) 10 MG tablet Take 1 tablet (10 mg total) by mouth daily.   90 tablet  3  . celecoxib (CELEBREX) 200 MG capsule Take 200 mg by mouth 2 (two) times daily.       Marland Kitchen docusate sodium (COLACE) 100 MG capsule Take 1 capsule (100 mg total) by mouth daily.  90 capsule  3  . doxazosin (CARDURA) 4 MG tablet Take 1 tablet (4 mg total) by mouth at bedtime.  90 tablet  3  . dutasteride (AVODART) 0.5 MG capsule Take 1 capsule (0.5 mg total) by mouth every other day.  45 capsule  3  . fluticasone (FLONASE) 50 MCG/ACT nasal spray 2 sprays by Nasal route daily.  48 g  3  . furosemide (LASIX) 40 MG tablet Take 1 tablet (40 mg total) by mouth daily.  30 tablet  11  . pantoprazole (PROTONIX) 40 MG tablet Take 1 tablet (40 mg total) by mouth 2 (two) times daily.  180 tablet  3  . polyethylene glycol powder (GLYCOLAX/MIRALAX) powder Take 17 g by mouth daily.        . Psyllium (METAMUCIL) 30.9 %  POWD Take by mouth. 1 tablespoon every day         . warfarin (COUMADIN) 5 MG tablet Take 5 mg by mouth daily. Take 5 mg on Sat, Sun, [place]. Take 10 mg on Mon, Wed & Fri.        No Known Allergies  Family History  Problem Relation Age of Onset  . Heart disease Mother   . Arthritis Other   . Cancer Other     Colon 1st degree relative <60    BP 128/78  Pulse 75  Temp(Src) 98.2 F (36.8 C) (Oral)  Ht 5\' 11"  (1.803 m)  Wt 217 lb 9.6 oz (98.703 kg)  BMI 30.35 kg/m2  SpO2 95%  Review of Systems Denies polyuria.      Objective:   Physical Exam VITAL SIGNS:  See vs page GENERAL: no distress Ext: 1+ bilat leg edema   Lab Results  Component Value Date   PTH 108.1* 12/19/2011   CALCIUM 10.6* 12/19/2011   CAION 1.50* 12/26/2009   PHOS 2.4 01/11/2011      Assessment & Plan:  Primary hyperparathyroidism.  No need for surgery now

## 2011-12-19 NOTE — Patient Instructions (Signed)
blood tests are being ordered for you today.  please call [phone] to hear your test results.  You will be prompted to enter the 9-digit "MRN" number that appears at the top left of this page, followed by #.  Then you will hear the message. Please make a follow-up appointment in 6 months.

## 2011-12-20 DIAGNOSIS — M1710 Unilateral primary osteoarthritis, unspecified knee: Secondary | ICD-10-CM | POA: Diagnosis not present

## 2011-12-20 LAB — PTH, INTACT AND CALCIUM: Calcium, Total (PTH): 10.6 mg/dL — ABNORMAL HIGH (ref 8.4–10.5)

## 2011-12-25 ENCOUNTER — Telehealth: Payer: Self-pay | Admitting: Family Medicine

## 2011-12-25 DIAGNOSIS — I82409 Acute embolism and thrombosis of unspecified deep veins of unspecified lower extremity: Secondary | ICD-10-CM

## 2011-12-25 NOTE — Telephone Encounter (Signed)
I set this up for the first week of February. Tell him [name] will call with details

## 2011-12-25 NOTE — Telephone Encounter (Signed)
Spoke with pt

## 2011-12-25 NOTE — Telephone Encounter (Signed)
Pt left voice message and said that he is due for his doppler on leg in Feb. He needs the referral put in for this.

## 2012-01-03 ENCOUNTER — Encounter: Payer: Self-pay | Admitting: *Deleted

## 2012-01-08 ENCOUNTER — Encounter (INDEPENDENT_AMBULATORY_CARE_PROVIDER_SITE_OTHER): Payer: Medicare Other | Admitting: Cardiology

## 2012-01-08 ENCOUNTER — Telehealth: Payer: Self-pay | Admitting: Family Medicine

## 2012-01-08 DIAGNOSIS — Z86718 Personal history of other venous thrombosis and embolism: Secondary | ICD-10-CM

## 2012-01-08 DIAGNOSIS — L539 Erythematous condition, unspecified: Secondary | ICD-10-CM | POA: Diagnosis not present

## 2012-01-08 DIAGNOSIS — I82409 Acute embolism and thrombosis of unspecified deep veins of unspecified lower extremity: Secondary | ICD-10-CM

## 2012-01-08 NOTE — Telephone Encounter (Signed)
noted 

## 2012-01-08 NOTE — Telephone Encounter (Signed)
Pt had doppler scan on right leg.

## 2012-01-09 NOTE — Progress Notes (Signed)
Quick Note:  Spoke with pt ______ 

## 2012-01-16 ENCOUNTER — Encounter: Payer: Self-pay | Admitting: Family Medicine

## 2012-01-16 ENCOUNTER — Ambulatory Visit (INDEPENDENT_AMBULATORY_CARE_PROVIDER_SITE_OTHER): Payer: Medicare Other | Admitting: Family Medicine

## 2012-01-16 VITALS — BP 124/78 | HR 93 | Temp 98.5°F | Ht 69.5 in | Wt 215.0 lb

## 2012-01-16 DIAGNOSIS — N139 Obstructive and reflux uropathy, unspecified: Secondary | ICD-10-CM | POA: Diagnosis not present

## 2012-01-16 DIAGNOSIS — E785 Hyperlipidemia, unspecified: Secondary | ICD-10-CM

## 2012-01-16 DIAGNOSIS — N401 Enlarged prostate with lower urinary tract symptoms: Secondary | ICD-10-CM

## 2012-01-16 DIAGNOSIS — J309 Allergic rhinitis, unspecified: Secondary | ICD-10-CM

## 2012-01-16 DIAGNOSIS — Z0000 Encounter for general adult medical examination without abnormal findings: Secondary | ICD-10-CM

## 2012-01-16 DIAGNOSIS — I10 Essential (primary) hypertension: Secondary | ICD-10-CM | POA: Diagnosis not present

## 2012-01-16 DIAGNOSIS — K219 Gastro-esophageal reflux disease without esophagitis: Secondary | ICD-10-CM

## 2012-01-16 DIAGNOSIS — N138 Other obstructive and reflux uropathy: Secondary | ICD-10-CM

## 2012-01-16 LAB — HEPATIC FUNCTION PANEL
ALT: 21 U/L (ref 0–53)
Albumin: 4.1 g/dL (ref 3.5–5.2)
Bilirubin, Direct: 0.2 mg/dL (ref 0.0–0.3)
Total Protein: 6.6 g/dL (ref 6.0–8.3)

## 2012-01-16 LAB — TSH: TSH: 1.65 u[IU]/mL (ref 0.35–5.50)

## 2012-01-16 LAB — LIPID PANEL
HDL: 75 mg/dL (ref >39.00)
Triglycerides: 36 mg/dL (ref 0.0–149.0)
VLDL: 7.2 mg/dL (ref 0.0–40.0)

## 2012-01-16 LAB — CBC WITH DIFFERENTIAL/PLATELET
Basophils Absolute: 0 10*3/uL (ref 0.0–0.1)
Eosinophils Relative: 1.3 % (ref 0.0–5.0)
Monocytes Relative: 13.7 % — ABNORMAL HIGH (ref 3.0–12.0)
Neutrophils Relative %: 63.4 % (ref 43.0–77.0)
Platelets: 210 10*3/uL (ref 150.0–400.0)
RDW: 14.8 % — ABNORMAL HIGH (ref 11.5–14.6)
WBC: 5.4 10*3/uL (ref 4.5–10.5)

## 2012-01-16 LAB — POCT URINALYSIS DIPSTICK
Glucose, UA: NEGATIVE
Leukocytes, UA: NEGATIVE
Nitrite, UA: NEGATIVE
Urobilinogen, UA: 1

## 2012-01-16 LAB — BASIC METABOLIC PANEL
BUN: 19 mg/dL (ref 6–23)
Calcium: 10.6 mg/dL — ABNORMAL HIGH (ref 8.4–10.5)
Creatinine, Ser: 0.9 mg/dL (ref 0.4–1.5)
GFR: 84.98 mL/min (ref >60.00)
Glucose, Bld: 97 mg/dL (ref 70–99)
Sodium: 139 mEq/L (ref 135–145)

## 2012-01-16 MED ORDER — DOXAZOSIN MESYLATE 4 MG PO TABS: 4.0000 mg | ORAL_TABLET | Freq: Every day | ORAL | Status: DC

## 2012-01-16 MED ORDER — ATORVASTATIN CALCIUM 10 MG PO TABS: 10.0000 mg | ORAL_TABLET | Freq: Every day | ORAL | Status: DC

## 2012-01-16 MED ORDER — NAPROXEN 500 MG PO TABS: 500.0000 mg | ORAL_TABLET | Freq: Two times a day (BID) | ORAL | Status: DC

## 2012-01-16 MED ORDER — DUTASTERIDE 0.5 MG PO CAPS: 0.5000 mg | ORAL_CAPSULE | ORAL | Status: DC

## 2012-01-16 MED ORDER — PANTOPRAZOLE SODIUM 40 MG PO TBEC: 40.0000 mg | DELAYED_RELEASE_TABLET | Freq: Two times a day (BID) | ORAL | Status: DC

## 2012-01-16 MED ORDER — FUROSEMIDE 40 MG PO TABS: 40.0000 mg | ORAL_TABLET | Freq: Every day | ORAL | Status: DC

## 2012-01-16 MED ORDER — FLUTICASONE PROPIONATE 50 MCG/ACT NA SUSP: 2.0000 | Freq: Every day | NASAL | Status: DC

## 2012-01-16 NOTE — Progress Notes (Signed)
  Subjective:    Patient ID: [name], male    DOB: 05/07/1933, 78 y.o.   MRN: [id number]  HPI 78 yr old male for a cpx. He feels well with no concerns today. He recently had a doppler on his right leg that showed the DVT to be scarred down and recannalized. We told him to stop taking Coumadin and to stay on a daily aspirin. He now would like to switch from Celebrex back to naproxen to save money.    Review of Systems  Constitutional: Negative.   HENT: Negative.   Eyes: Negative.   Respiratory: Negative.   Cardiovascular: Negative.   Gastrointestinal: Negative.   Genitourinary: Negative.   Musculoskeletal: Negative.   Skin: Negative.   Neurological: Negative.   Hematological: Negative.   Psychiatric/Behavioral: Negative.        Objective:   Physical Exam  Constitutional: He is oriented to person, place, and time. He appears well-developed and well-nourished. No distress.  HENT:  Head: Normocephalic and atraumatic.  Right Ear: External ear normal.  Left Ear: External ear normal.  Nose: Nose normal.  Mouth/Throat: Oropharynx is clear and moist. No oropharyngeal exudate.  Eyes: Conjunctivae and EOM are normal. Pupils are equal, round, and reactive to light. Right eye exhibits no discharge. Left eye exhibits no discharge. No scleral icterus.  Neck: Neck supple. No JVD present. No tracheal deviation present. No thyromegaly present.  Cardiovascular: Normal rate, regular rhythm, normal heart sounds and intact distal pulses.  Exam reveals no gallop and no friction rub.   No murmur heard.      EKG normal with stable RBBB  Pulmonary/Chest: Effort normal and breath sounds normal. No respiratory distress. He has no wheezes. He has no rales. He exhibits no tenderness.  Abdominal: Soft. Bowel sounds are normal. He exhibits no distension and no mass. There is no tenderness. There is no rebound and no guarding.  Genitourinary: Rectum normal, prostate normal and penis normal. Guaiac  negative stool. No penile tenderness.  Musculoskeletal: Normal range of motion. He exhibits no edema and no tenderness.  Lymphadenopathy:    He has no cervical adenopathy.  Neurological: He is alert and oriented to person, place, and time. He has normal reflexes. No cranial nerve deficit. He exhibits normal muscle tone. Coordination normal.  Skin: Skin is warm and dry. No rash noted. He is not diaphoretic. No erythema. No pallor.  Psychiatric: He has a normal mood and affect. His behavior is normal. Judgment and thought content normal.          Assessment & Plan:  Well exam. Get fasting labs today. Switch to Naproxen.

## 2012-01-18 ENCOUNTER — Encounter: Payer: Self-pay | Admitting: Family Medicine

## 2012-01-18 NOTE — Progress Notes (Signed)
Quick Note:  Spoke with pt and put a copy of results in mail. ______

## 2012-01-22 DIAGNOSIS — L821 Other seborrheic keratosis: Secondary | ICD-10-CM | POA: Diagnosis not present

## 2012-01-22 DIAGNOSIS — L578 Other skin changes due to chronic exposure to nonionizing radiation: Secondary | ICD-10-CM | POA: Diagnosis not present

## 2012-01-22 DIAGNOSIS — Z85828 Personal history of other malignant neoplasm of skin: Secondary | ICD-10-CM | POA: Diagnosis not present

## 2012-01-22 DIAGNOSIS — L570 Actinic keratosis: Secondary | ICD-10-CM | POA: Diagnosis not present

## 2012-01-31 DIAGNOSIS — M1710 Unilateral primary osteoarthritis, unspecified knee: Secondary | ICD-10-CM | POA: Diagnosis not present

## 2012-02-07 DIAGNOSIS — M1710 Unilateral primary osteoarthritis, unspecified knee: Secondary | ICD-10-CM | POA: Diagnosis not present

## 2012-02-14 DIAGNOSIS — M1710 Unilateral primary osteoarthritis, unspecified knee: Secondary | ICD-10-CM | POA: Diagnosis not present

## 2012-02-16 ENCOUNTER — Other Ambulatory Visit: Payer: Self-pay | Admitting: Family Medicine

## 2012-03-19 ENCOUNTER — Encounter: Payer: Self-pay | Admitting: Family Medicine

## 2012-03-19 ENCOUNTER — Ambulatory Visit (INDEPENDENT_AMBULATORY_CARE_PROVIDER_SITE_OTHER): Payer: Medicare Other | Admitting: Family Medicine

## 2012-03-19 ENCOUNTER — Ambulatory Visit: Payer: Self-pay | Admitting: Family Medicine

## 2012-03-19 VITALS — BP 122/74 | HR 93 | Temp 98.3°F | Wt 219.0 lb

## 2012-03-19 DIAGNOSIS — R609 Edema, unspecified: Secondary | ICD-10-CM

## 2012-03-19 DIAGNOSIS — R600 Localized edema: Secondary | ICD-10-CM

## 2012-03-19 NOTE — Progress Notes (Signed)
  Subjective:    Patient ID: [name], male    DOB: August 30, 1933, 78 y.o.   MRN: [id number]  HPI Here to ask about swelling in both lower legs and feet. 4 days ago he ate dinner with a group of friends, and they were seated around a table for several hours. He could not move his feet at all, and as a result they swelled that night. No pain at all. Then over the next 24 hours they went back down and have stayed at his baseline ever since. No SOB.    Review of Systems  Constitutional: Negative.   Respiratory: Negative.   Cardiovascular: Positive for leg swelling. Negative for chest pain and palpitations.       Objective:   Physical Exam  Constitutional: He appears well-developed and well-nourished.  Cardiovascular: Normal rate, regular rhythm, normal heart sounds and intact distal pulses.   Pulmonary/Chest: Effort normal and breath sounds normal.  Musculoskeletal:       Trace edema to both legs, no tenderness, no cords           Assessment & Plan:  This was benign edema from sitting so long. It has resolved, and he does not need to worry about it. Recheck prn

## 2012-03-27 DIAGNOSIS — M1710 Unilateral primary osteoarthritis, unspecified knee: Secondary | ICD-10-CM | POA: Diagnosis not present

## 2012-04-22 ENCOUNTER — Other Ambulatory Visit: Payer: Self-pay | Admitting: Dermatology

## 2012-04-22 DIAGNOSIS — L820 Inflamed seborrheic keratosis: Secondary | ICD-10-CM | POA: Diagnosis not present

## 2012-04-22 DIAGNOSIS — D0460 Carcinoma in situ of skin of unspecified upper limb, including shoulder: Secondary | ICD-10-CM | POA: Diagnosis not present

## 2012-04-22 DIAGNOSIS — L578 Other skin changes due to chronic exposure to nonionizing radiation: Secondary | ICD-10-CM | POA: Diagnosis not present

## 2012-04-22 DIAGNOSIS — C44320 Squamous cell carcinoma of skin of unspecified parts of face: Secondary | ICD-10-CM | POA: Diagnosis not present

## 2012-04-22 DIAGNOSIS — D0439 Carcinoma in situ of skin of other parts of face: Secondary | ICD-10-CM | POA: Diagnosis not present

## 2012-04-22 DIAGNOSIS — L821 Other seborrheic keratosis: Secondary | ICD-10-CM | POA: Diagnosis not present

## 2012-05-12 DIAGNOSIS — C44320 Squamous cell carcinoma of skin of unspecified parts of face: Secondary | ICD-10-CM | POA: Diagnosis not present

## 2012-06-04 ENCOUNTER — Encounter: Payer: Self-pay | Admitting: Family Medicine

## 2012-06-04 ENCOUNTER — Ambulatory Visit (INDEPENDENT_AMBULATORY_CARE_PROVIDER_SITE_OTHER): Payer: Medicare Other | Admitting: Family Medicine

## 2012-06-04 VITALS — BP 124/78 | HR 100 | Temp 98.8°F | Wt 217.0 lb

## 2012-06-04 DIAGNOSIS — K5732 Diverticulitis of large intestine without perforation or abscess without bleeding: Secondary | ICD-10-CM

## 2012-06-04 NOTE — Progress Notes (Signed)
  Subjective:    Patient ID: [name], male    DOB: 1932/12/21, 78 y.o.   MRN: [id number]  HPI Here for an episode of LLQ abdominal pain 2 days ago that lasted about 5 hours and then went away. For the past 36 hours he has felt fine. He had a normal BM yesterday morning. No nausea or fever. No urinary symptoms. His last bout of diverticulitis was 2 years ago.    Review of Systems  Constitutional: Negative.   Respiratory: Negative.   Cardiovascular: Negative.   Gastrointestinal: Positive for abdominal pain. Negative for nausea, vomiting, diarrhea, constipation, blood in stool, abdominal distention and rectal pain.  Genitourinary: Negative.        Objective:   Physical Exam  Constitutional: He appears well-developed and well-nourished.  Cardiovascular: Normal rate, regular rhythm, normal heart sounds and intact distal pulses.   Pulmonary/Chest: Effort normal and breath sounds normal.  Abdominal: Soft. Bowel sounds are normal. He exhibits no distension and no mass. There is no tenderness. There is no rebound and no guarding.          Assessment & Plan:  This was apparently a mild bout of diverticulitis that resolved on its own. Recheck prn

## 2012-06-16 ENCOUNTER — Ambulatory Visit (INDEPENDENT_AMBULATORY_CARE_PROVIDER_SITE_OTHER): Payer: Medicare Other | Admitting: Endocrinology

## 2012-06-16 ENCOUNTER — Ambulatory Visit (INDEPENDENT_AMBULATORY_CARE_PROVIDER_SITE_OTHER)
Admission: RE | Admit: 2012-06-16 | Discharge: 2012-06-16 | Disposition: A | Discharge status: Home / Self Care | Payer: Medicare Other | Source: Ambulatory Visit

## 2012-06-16 ENCOUNTER — Encounter: Payer: Self-pay | Admitting: Endocrinology

## 2012-06-16 VITALS — BP 128/82 | HR 78 | Temp 98.4°F | Ht 71.0 in | Wt 221.0 lb

## 2012-06-16 DIAGNOSIS — E210 Primary hyperparathyroidism: Secondary | ICD-10-CM

## 2012-06-16 NOTE — Progress Notes (Signed)
Subjective:    Patient ID: [name], male    DOB: Mar 20, 1933, 78 y.o.   MRN: [id number]  HPI Pt is here to f/u primary hyperparathyroidism (dx'ed 2009; it improved with discontinuation of hctz, but has recurred since).  He denies numbness and muscle weakness.  He is done with his course of coumadin.   Past Medical History  Diagnosis Date  . GERD (gastroesophageal reflux disease)   . Allergy   . Normal cardiac stress test     10-29-08  . Diverticulosis   . Sciatica   . ED (erectile dysfunction)   . BPH (benign prostatic hypertrophy)   . Osteoarthritis   . Headache   . Personal history of colonic polyps   . Hypertension   . Other and unspecified hyperlipidemia   . Hypercalcemia     sees Dr Loanne Drilling  . Pulmonary embolism     01-10-11  . DVT of lower extremity (deep venous thrombosis)     right leg, 01-10-11  . Diabetes mellitus     Past Surgical History  Procedure Date  . Knee arthroscopy     rt knee  . Joint replacement     rt knee replacement 2003 Dr Wynelle Link  . Basal cell carcinoma excision     removed face back Dr Sherrye Payor, had MOHS Dr Sarajane Jews  . Tonsillectomy   . Hernia repair     Inguinal right Dr Rise Patience  3-05  . Knee arthroscopy     left knee 2011 Dr Wynelle Link   . Colonoscopy 07-23-08    per Dr. Deatra Ina, polyps, repeat in 5 yrs     History   Social History  . Marital Status: Married    Spouse Name: N/A    Number of Children: N/A  . Years of Education: N/A   Occupational History  . retired    Social History Main Topics  . Smoking status: Former Smoker    Quit date: 11/26/1961  . Smokeless tobacco: Never Used  . Alcohol Use: 4.2 oz/week    7 Glasses of wine per week  . Drug Use: No  . Sexually Active: Not on file   Other Topics Concern  . Not on file   Social History Narrative   Daily caffeine use    Current Outpatient Prescriptions on File Prior to Visit  Medication Sig Dispense Refill  . aspirin 325 MG tablet Take 325 mg by mouth  daily.      Marland Kitchen atorvastatin (LIPITOR) 10 MG tablet Take 1 tablet (10 mg total) by mouth daily.  90 tablet  3  . doxazosin (CARDURA) 4 MG tablet TAKE 1 TABLET ONCE DAILY  90 tablet  3  . dutasteride (AVODART) 0.5 MG capsule Take 1 capsule (0.5 mg total) by mouth every other day.  45 capsule  3  . fluticasone (FLONASE) 50 MCG/ACT nasal spray Place 2 sprays into the nose daily.  48 g  3  . furosemide (LASIX) 40 MG tablet Take 1 tablet (40 mg total) by mouth daily.  90 tablet  3  . naproxen (NAPROSYN) 500 MG tablet Take 1 tablet (500 mg total) by mouth 2 (two) times daily with a meal.  180 tablet  3  . pantoprazole (PROTONIX) 40 MG tablet Take 1 tablet (40 mg total) by mouth 2 (two) times daily.  180 tablet  3  . polyethylene glycol powder (GLYCOLAX/MIRALAX) powder Take 17 g by mouth daily.        . Psyllium (METAMUCIL) 30.9 % POWD Take  by mouth. 1 tablespoon every day           No Known Allergies  Family History  Problem Relation Age of Onset  . Heart disease Mother   . Arthritis Other   . Cancer Other     Colon 1st degree relative <60    BP 128/82  Pulse 78  Temp 98.4 F (36.9 C) (Oral)  Ht 5\' 11"  (1.803 m)  Wt 221 lb (100.245 kg)  BMI 30.82 kg/m2  SpO2 95%   Review of Systems Denies arthralgias, except for the left knee    Objective:   Physical Exam VITAL SIGNS:  See vs page GENERAL: no distress Ext: 1+ bilat leg edema  Lab Results  Component Value Date   CALCIUM 10.6* 01/16/2012   PHOS 2.4 01/11/2011      Assessment & Plan:  Primary hyperparathyroidism, mild and stable.  If dexa is ok, he can return in 1 year.

## 2012-06-16 NOTE — Patient Instructions (Addendum)
Let's check a bone-density test.  Please schedule it at the appointment desk.   Please come back for a follow-up appointment in 1 year.

## 2012-06-17 ENCOUNTER — Ambulatory Visit (INDEPENDENT_AMBULATORY_CARE_PROVIDER_SITE_OTHER): Payer: Medicare Other | Admitting: Family

## 2012-06-17 DIAGNOSIS — I2699 Other pulmonary embolism without acute cor pulmonale: Secondary | ICD-10-CM

## 2012-06-17 NOTE — Patient Instructions (Signed)
Coumadin stopped by Dr. Sarajane Jews 01/16/12

## 2012-06-27 DIAGNOSIS — H2510 Age-related nuclear cataract, unspecified eye: Secondary | ICD-10-CM | POA: Diagnosis not present

## 2012-06-30 DIAGNOSIS — M1710 Unilateral primary osteoarthritis, unspecified knee: Secondary | ICD-10-CM | POA: Diagnosis not present

## 2012-07-25 DIAGNOSIS — L821 Other seborrheic keratosis: Secondary | ICD-10-CM | POA: Diagnosis not present

## 2012-07-25 DIAGNOSIS — Z85828 Personal history of other malignant neoplasm of skin: Secondary | ICD-10-CM | POA: Diagnosis not present

## 2012-08-12 DIAGNOSIS — M1710 Unilateral primary osteoarthritis, unspecified knee: Secondary | ICD-10-CM | POA: Diagnosis not present

## 2012-10-02 DIAGNOSIS — M25579 Pain in unspecified ankle and joints of unspecified foot: Secondary | ICD-10-CM | POA: Diagnosis not present

## 2012-10-02 DIAGNOSIS — M1710 Unilateral primary osteoarthritis, unspecified knee: Secondary | ICD-10-CM | POA: Diagnosis not present

## 2012-10-03 ENCOUNTER — Ambulatory Visit (INDEPENDENT_AMBULATORY_CARE_PROVIDER_SITE_OTHER): Payer: Medicare Other | Admitting: Family Medicine

## 2012-10-03 ENCOUNTER — Encounter: Payer: Self-pay | Admitting: Family Medicine

## 2012-10-03 VITALS — BP 140/90 | HR 98 | Temp 98.9°F | Wt 217.0 lb

## 2012-10-03 DIAGNOSIS — R609 Edema, unspecified: Secondary | ICD-10-CM

## 2012-10-03 DIAGNOSIS — M1990 Unspecified osteoarthritis, unspecified site: Secondary | ICD-10-CM

## 2012-10-03 DIAGNOSIS — R600 Localized edema: Secondary | ICD-10-CM

## 2012-10-03 NOTE — Progress Notes (Signed)
  Subjective:    Patient ID: [name], male    DOB: 12-18-1932, 78 y.o.   MRN: [id number]  HPI Here to check on swelling in the left lower leg. He has a hx of DVT in the legs. He has severe arthritis in the left knee and is scheduled for a total knee replacement per Dr. Wynelle Link on 12-08-12. He has no pain in the left lower leg or foot.    Review of Systems  Constitutional: Negative.   Respiratory: Negative.   Cardiovascular: Positive for leg swelling. Negative for chest pain and palpitations.       Objective:   Physical Exam  Constitutional:       Walks with a painful limp   Cardiovascular: Normal rate, regular rhythm, normal heart sounds and intact distal pulses.   Pulmonary/Chest: Effort normal and breath sounds normal.  Musculoskeletal:       There is 2+ edema in the left lower leg from the midshin to the foot. The entire foot is swollen. No erythema or tenderness or warmth. The calf is not tender. No cords felt. Negative Homans.          Assessment & Plan:  There is no evidence of another DVT at this time. I think the edema is secondary to the severe arthritis in the knee and his altered gait. He will try some knee high OTC compression hose. Elevate the leg as much as possible.

## 2012-10-06 ENCOUNTER — Ambulatory Visit: Payer: Self-pay | Admitting: Family Medicine

## 2012-10-09 ENCOUNTER — Other Ambulatory Visit: Payer: Self-pay | Admitting: Orthopedic Surgery

## 2012-10-09 MED ORDER — DEXAMETHASONE SODIUM PHOSPHATE 10 MG/ML IJ SOLN: 10.0000 mg | Freq: Once | INTRAMUSCULAR | Status: DC

## 2012-10-09 MED ORDER — BUPIVACAINE 0.25 % ON-Q PUMP SINGLE CATH 300ML: 300.0000 mL | INJECTION | Status: DC

## 2012-10-09 NOTE — Progress Notes (Signed)
Preoperative surgical orders have been place into the [facility] hospital system for [employer] on 10/09/2012, 11:47 AM  by Mickel Crow for surgery on 12/08/12.  Preop Total Knee orders including Bupivacaine On-Q pump, IV Tylenol, and IV Decadron as long as there are no contraindications to the above medications. Arlee Muslim, PA-C

## 2012-10-27 DIAGNOSIS — L570 Actinic keratosis: Secondary | ICD-10-CM | POA: Diagnosis not present

## 2012-10-27 DIAGNOSIS — L578 Other skin changes due to chronic exposure to nonionizing radiation: Secondary | ICD-10-CM | POA: Diagnosis not present

## 2012-10-27 DIAGNOSIS — L723 Sebaceous cyst: Secondary | ICD-10-CM | POA: Diagnosis not present

## 2012-10-27 DIAGNOSIS — L821 Other seborrheic keratosis: Secondary | ICD-10-CM | POA: Diagnosis not present

## 2012-11-05 ENCOUNTER — Ambulatory Visit (INDEPENDENT_AMBULATORY_CARE_PROVIDER_SITE_OTHER): Payer: Medicare Other | Admitting: Family Medicine

## 2012-11-05 ENCOUNTER — Telehealth: Payer: Self-pay | Admitting: Family Medicine

## 2012-11-05 ENCOUNTER — Encounter: Payer: Self-pay | Admitting: Family Medicine

## 2012-11-05 VITALS — BP 138/82 | HR 102 | Temp 98.3°F | Ht 70.0 in | Wt 208.0 lb

## 2012-11-05 DIAGNOSIS — Z01818 Encounter for other preprocedural examination: Secondary | ICD-10-CM

## 2012-11-05 DIAGNOSIS — R609 Edema, unspecified: Secondary | ICD-10-CM

## 2012-11-05 DIAGNOSIS — K219 Gastro-esophageal reflux disease without esophagitis: Secondary | ICD-10-CM | POA: Diagnosis not present

## 2012-11-05 DIAGNOSIS — I10 Essential (primary) hypertension: Secondary | ICD-10-CM | POA: Diagnosis not present

## 2012-11-05 DIAGNOSIS — M1990 Unspecified osteoarthritis, unspecified site: Secondary | ICD-10-CM

## 2012-11-05 DIAGNOSIS — E210 Primary hyperparathyroidism: Secondary | ICD-10-CM

## 2012-11-05 DIAGNOSIS — E785 Hyperlipidemia, unspecified: Secondary | ICD-10-CM

## 2012-11-05 LAB — BASIC METABOLIC PANEL
BUN: 21 mg/dL (ref 6–23)
CO2: 29 mEq/L (ref 19–32)
Glucose, Bld: 99 mg/dL (ref 70–99)
Potassium: 4.1 mEq/L (ref 3.5–5.1)
Sodium: 139 mEq/L (ref 135–145)

## 2012-11-05 LAB — POCT URINALYSIS DIPSTICK
Blood, UA: NEGATIVE
Nitrite, UA: NEGATIVE
Urobilinogen, UA: 1
pH, UA: 7

## 2012-11-05 LAB — CBC WITH DIFFERENTIAL/PLATELET
Basophils Absolute: 0 10*3/uL (ref 0.0–0.1)
Basophils Relative: 0.7 % (ref 0.0–3.0)
Eosinophils Absolute: 0.1 10*3/uL (ref 0.0–0.7)
MCHC: 34.3 g/dL (ref 30.0–36.0)
MCV: 98.9 fl (ref 78.0–100.0)
Monocytes Absolute: 0.8 10*3/uL (ref 0.1–1.0)
Neutrophils Relative %: 67.8 % (ref 43.0–77.0)
Platelets: 239 10*3/uL (ref 150.0–400.0)
RBC: 3.96 Mil/uL — ABNORMAL LOW (ref 4.22–5.81)
RDW: 14.4 % (ref 11.5–14.6)

## 2012-11-05 LAB — LIPID PANEL: Cholesterol: 173 mg/dL (ref 0–200)

## 2012-11-05 LAB — HEPATIC FUNCTION PANEL
ALT: 18 U/L (ref 0–53)
AST: 22 U/L (ref 0–37)
Albumin: 4.3 g/dL (ref 3.5–5.2)
Alkaline Phosphatase: 55 U/L (ref 39–117)
Total Protein: 7.3 g/dL (ref 6.0–8.3)

## 2012-11-05 NOTE — Telephone Encounter (Signed)
I spoke with pt  

## 2012-11-05 NOTE — Progress Notes (Signed)
Subjective:    Patient ID: [name], male    DOB: Jun 06, 1933, 78 y.o.   MRN: [id number]  HPI 78 yr old male for a presurgical clearance evaluation. He is scheduled for a left total knee replacement per Dr. Gaynelle Arabian on 12-08-12. Other than the knee pain, he is doing well. No other issues.   Past Medical History  Diagnosis Date  . GERD (gastroesophageal reflux disease)   . Allergy   . Normal cardiac stress test     10-29-08  . Diverticulosis   . Sciatica   . ED (erectile dysfunction)   . BPH (benign prostatic hypertrophy)   . Osteoarthritis   . Headache   . Personal history of colonic polyps   . Hypertension   . Other and unspecified hyperlipidemia   . Hypercalcemia     sees Dr Loanne Drilling  . Pulmonary embolism     01-10-11  . DVT of lower extremity (deep venous thrombosis)     right leg, 01-10-11  . Diabetes mellitus    Past Surgical History  Procedure Date  . Knee arthroscopy     rt knee  . Joint replacement     rt knee replacement 2003 Dr Wynelle Link  . Basal cell carcinoma excision     removed face back Dr Sherrye Payor, had MOHS Dr Sarajane Jews  . Tonsillectomy   . Hernia repair     Inguinal right Dr Rise Patience  3-05  . Knee arthroscopy     left knee 2011 Dr Wynelle Link   . Colonoscopy 07-23-08    per Dr. Deatra Ina, polyps, repeat in 5 yrs     Review of Systems  Constitutional: Negative.   HENT: Negative.   Eyes: Negative.   Respiratory: Negative.   Cardiovascular: Negative.   Gastrointestinal: Negative.   Genitourinary: Negative.   Musculoskeletal: Positive for arthralgias and gait problem. Negative for myalgias, back pain and joint swelling.  Skin: Negative.   Neurological: Negative.   Hematological: Negative.   Psychiatric/Behavioral: Negative.        Objective:   Physical Exam  Constitutional: He is oriented to person, place, and time. He appears well-developed and well-nourished. No distress.  HENT:  Head: Normocephalic and atraumatic.  Right Ear: External  ear normal.  Left Ear: External ear normal.  Nose: Nose normal.  Mouth/Throat: Oropharynx is clear and moist.  Eyes: Conjunctivae normal and EOM are normal. Pupils are equal, round, and reactive to light. No scleral icterus.  Neck: Normal range of motion. Neck supple. No JVD present. No thyromegaly present.  Cardiovascular: Normal rate, regular rhythm, normal heart sounds and intact distal pulses.  Exam reveals no gallop and no friction rub.   No murmur heard.      EKG is at his baseline with sinus rhythm, RBBB, and left axis bifascicular block   Pulmonary/Chest: Effort normal and breath sounds normal. No respiratory distress. He has no wheezes. He has no rales. He exhibits no tenderness.  Abdominal: Soft. Bowel sounds are normal. He exhibits no distension and no mass. There is no tenderness. There is no rebound and no guarding.  Musculoskeletal: Normal range of motion. He exhibits no tenderness.       1+ edema in both lower legs and feet   Lymphadenopathy:    He has no cervical adenopathy.  Neurological: He is alert and oriented to person, place, and time. He has normal reflexes. No cranial nerve deficit. He exhibits normal muscle tone. Coordination normal.  Skin: Skin is warm and dry. No rash  noted. No erythema. No pallor.  Psychiatric: He has a normal mood and affect. His behavior is normal. Judgment and thought content normal.          Assessment & Plan:  He seems to be doing fine in general. We will get some fasting labs today. With his hx of DVTs and a PE, he will need antithrombotic prophylaxis through the surgical period.

## 2012-11-07 NOTE — Progress Notes (Signed)
  Subjective:    Patient ID: [name], male    DOB: Nov 26, 1933, 78 y.o.   MRN: [id number]  HPI    Review of Systems     Objective:   Physical Exam        Assessment & Plan:  His labs were unremarkable. He is cleared for surgery. Please fax a copy of this note to Pollyann Savoy at [phone].

## 2012-11-07 NOTE — Progress Notes (Signed)
Quick Note:  I spoke with pt and faxed a copy of office visit to [place] at [phone]. ______

## 2012-11-09 ENCOUNTER — Encounter: Payer: Self-pay | Admitting: Family Medicine

## 2012-11-10 ENCOUNTER — Telehealth: Payer: Self-pay | Admitting: Family Medicine

## 2012-11-10 DIAGNOSIS — K219 Gastro-esophageal reflux disease without esophagitis: Secondary | ICD-10-CM

## 2012-11-10 MED ORDER — PANTOPRAZOLE SODIUM 40 MG PO TBEC: 40.0000 mg | DELAYED_RELEASE_TABLET | Freq: Two times a day (BID) | ORAL | Status: DC

## 2012-11-10 NOTE — Telephone Encounter (Signed)
Refill request for Protonix and I did send e-scribe to [facility] ( 90 day supply ).

## 2012-11-24 DIAGNOSIS — M1710 Unilateral primary osteoarthritis, unspecified knee: Secondary | ICD-10-CM | POA: Diagnosis not present

## 2012-11-25 ENCOUNTER — Encounter (HOSPITAL_COMMUNITY): Payer: Self-pay | Admitting: Pharmacy Technician

## 2012-11-27 NOTE — Patient Instructions (Addendum)
[place]  11/27/2012   Your procedure is scheduled on: 12/08/12  Report to [place] at 12:00 PM.  Call this number if you have problems the morning of surgery 336-: [phone]   Remember:   Do not eat food After Midnight 12/08/11, clear liquids from midnight until 0830 am on 12/08/12 then nothing.     Take these medicines the morning of surgery with A SIP OF WATER: protonix, lipitor   Do not wear jewelry, make-up or nail polish.  Do not wear lotions, powders, or perfumes. You may wear deodorant.  Do not shave 48 hours prior to surgery. Men may shave face and neck.  Do not bring valuables to the hospital.  Contacts, dentures or bridgework may not be worn into surgery.  Leave suitcase in the car. After surgery it may be brought to your room.  For patients admitted to the hospital, checkout time is 11:00 AM the day of discharge.    Please read over the following fact sheets that you were given: MRSA Information, blood fact sheet, incentive spirometer fact sheet Paulette Blanch, RN  pre op nurse call if needed [phone]    FAILURE TO [place] OF YOUR SURGERY   Patient Signature: ___________________________________________

## 2012-11-27 NOTE — Progress Notes (Signed)
EKG 11/05/12 EP[facility], Surgery clearance note Dr. Sarajane Jews 06/04/12 on chart, office visit 11/05/12 Dr. Sarajane Jews on chart

## 2012-11-28 ENCOUNTER — Encounter (HOSPITAL_COMMUNITY): Payer: Self-pay

## 2012-11-28 ENCOUNTER — Ambulatory Visit (HOSPITAL_COMMUNITY)
Admission: RE | Admit: 2012-11-28 | Discharge: 2012-11-28 | Disposition: A | Discharge status: Home / Self Care | Payer: Medicare Other | Source: Ambulatory Visit | Attending: Orthopedic Surgery | Admitting: Orthopedic Surgery

## 2012-11-28 ENCOUNTER — Encounter (HOSPITAL_COMMUNITY)
Admission: RE | Admit: 2012-11-28 | Discharge: 2012-11-28 | Disposition: A | Discharge status: Home / Self Care | Payer: Medicare Other | Source: Ambulatory Visit | Attending: Orthopedic Surgery | Admitting: Orthopedic Surgery

## 2012-11-28 DIAGNOSIS — Z01818 Encounter for other preprocedural examination: Secondary | ICD-10-CM | POA: Insufficient documentation

## 2012-11-28 DIAGNOSIS — I77810 Thoracic aortic ectasia: Secondary | ICD-10-CM | POA: Insufficient documentation

## 2012-11-28 DIAGNOSIS — Z01812 Encounter for preprocedural laboratory examination: Secondary | ICD-10-CM | POA: Diagnosis not present

## 2012-11-28 HISTORY — DX: Hyperparathyroidism, unspecified: E21.3

## 2012-11-28 HISTORY — DX: Myoneural disorder, unspecified: G70.9

## 2012-11-28 LAB — COMPREHENSIVE METABOLIC PANEL
BUN: 18 mg/dL (ref 6–23)
CO2: 29 mEq/L (ref 19–32)
Calcium: 11.5 mg/dL — ABNORMAL HIGH (ref 8.4–10.5)
Creatinine, Ser: 1.08 mg/dL (ref 0.50–1.35)
GFR calc Af Amer: 74 mL/min — ABNORMAL LOW (ref >90)
GFR calc non Af Amer: 64 mL/min — ABNORMAL LOW (ref >90)
Glucose, Bld: 97 mg/dL (ref 70–99)

## 2012-11-28 LAB — CBC
Hemoglobin: 13.2 g/dL (ref 13.0–17.0)
MCH: 33.7 pg (ref 26.0–34.0)
MCV: 96.2 fL (ref 78.0–100.0)
RBC: 3.92 MIL/uL — ABNORMAL LOW (ref 4.22–5.81)

## 2012-11-28 LAB — PROTIME-INR: INR: 1.03 (ref 0.00–1.49)

## 2012-11-28 LAB — URINALYSIS, ROUTINE W REFLEX MICROSCOPIC
Ketones, ur: NEGATIVE mg/dL
Leukocytes, UA: NEGATIVE
Nitrite: NEGATIVE
Protein, ur: NEGATIVE mg/dL

## 2012-12-07 ENCOUNTER — Other Ambulatory Visit: Payer: Self-pay | Admitting: Orthopedic Surgery

## 2012-12-07 NOTE — H&P (Signed)
[name]  DOB: 1933/01/16 Married / Language: English / Race: White Male  Date of Admission: 12/08/2012  Chief Complaint:  Left Knee Pain  History of Present Illness The patient is a 79 year old male who comes in for a preoperative History and Physical. The patient is scheduled for a left total knee arthroplasty to be performed by Dr. Dione Plover. Aluisio, MD at [facility] on 12/08/2012. The patient is a 79 year old male who presents today for follow up of their knee. The patient is being followed for their left knee pain and osteoarthritis. Symptoms reported today include: pain. The patient feels that they are doing well and report their pain level to be mild to moderate. Current treatment includes: NSAIDs (naproxen with voltaren gel) and icing daily. The patient has reported improvement of their symptoms with: Cortisone injections. The cortisone has worn off. The Visco supplements in the past have not worked. He is having increasing pain and increasing difficulties with functions. He is at a stage now where he feels like he needs to do something further. His favorite past time is playing golf, and he cannot even complete a round of golf anymore. He feels like the knee is the only thing holding him back. He did great with his right knee replacement.  He is now ready to proceed with the left knee replacement They have been treated conservatively in the past for the above stated problem and despite conservative measures, they continue to have progressive pain and severe functional limitations and dysfunction. They have failed non-operative management including home exercise, medications, and injections. It is felt that they would benefit from undergoing total joint replacement. Risks and benefits of the procedure have been discussed with the patient and they elect to proceed with surgery. There are no active contraindications to surgery such as ongoing infection or rapidly  progressive neurological disease.  Problem List Osteoarthrosis NOS, lower leg (715.96)  Allergies No Known Drug Allergies   Family History Chronic Obstructive Lung Disease. brother Osteoarthritis. mother   Social History Exercise. Exercises daily; does running / walking and gym / weights Drug/Alcohol Rehab (Previously). no Drug/Alcohol Rehab (Currently). no Number of flights of stairs before winded. 2-3 Pain Contract. no Illicit drug use. no Current work status. retired Careers information officer. 4 Alcohol use. current drinker; drinks wine; 8-14 per week Tobacco / smoke exposure. no Tobacco use. former smoker; smoke(d) 3/4 pack(s) per day Living situation. live with spouse Marital status. married Post-Surgical Plans. Plan is to go to [place] at [place]. Advance Directives. Living Will, Healthcare POA   Medication History Aspirin EC (325MG  Tablet DR, Oral) Active. Atorvastatin Calcium (10MG  Tablet, Oral) Active. Doxazosin Mesylate (4MG  Tablet, Oral) Active. Avodart (0.5MG  Capsule, Oral) Active. Fluticasone Propionate (50MCG/ACT Suspension, Nasal) Active. Furosemide (40MG  Tablet, Oral) Active. Naproxen ( Oral) Specific dose unknown - Active. Pantoprazole Sodium (40MG  Tablet DR, Oral) Active. MiraLax ( Oral) Active. Metamucil (30.9% Powder, Oral) Active.   Past Surgical History Arthroscopy of Knee. left Total Knee Replacement. right Vasectomy   Medical History Osteoarthritis Migraine Headache High blood pressure Hypercholesterolemia Edema Hypercalcemia Diverticulosis Gastroesophageal Reflux Disease Enlarged prostate Diet-Controlled Diabetes Mellitus Hyperparathyroidism, primary (252.01) History of pulmonary embolism (V12.55) History of DVT of lower extremity (V12.51) ED (erectile dysfunction) (607.84)   Review of Systems General:Not Present- Chills, Fever, Night Sweats, Fatigue, Weight Gain, Weight Loss and Memory Loss. Skin:Not  Present- Hives, Itching, Rash, Eczema and Lesions. HEENT:Not Present- Tinnitus, Headache, Double Vision, Visual Loss, Hearing Loss and Dentures. Respiratory:Not Present-  Shortness of breath with exertion, Shortness of breath at rest, Allergies, Coughing up blood and Chronic Cough. Cardiovascular:Not Present- Chest Pain, Racing/skipping heartbeats, Difficulty Breathing Lying Down, Murmur, Swelling and Palpitations. Gastrointestinal:Not Present- Bloody Stool, Heartburn, Abdominal Pain, Vomiting, Nausea, Constipation, Diarrhea, Difficulty Swallowing, Jaundice and Loss of appetitie. Male Genitourinary:Not Present- Urinary frequency, Blood in Urine, Weak urinary stream, Discharge, Flank Pain, Incontinence, Painful Urination, Urgency, Urinary Retention and Urinating at Night. Musculoskeletal:Not Present- Muscle Weakness, Muscle Pain, Joint Swelling, Joint Pain, Back Pain, Morning Stiffness and Spasms. Neurological:Not Present- Tremor, Dizziness, Blackout spells, Paralysis, Difficulty with balance and Weakness. Psychiatric:Not Present- Insomnia.   Vitals Weight: 215 lb Height: 71 in Weight was reported by patient. Height was reported by patient. Body Surface Area: 2.21 m Body Mass Index: 29.99 kg/m Pulse: 76 (Regular) Resp.: 16 (Unlabored) BP: 132/86 (Sitting, Left Arm, Standard)    Physical Exam The physical exam findings are as follows:  Note: Patient is a 79 year old male with continued knee pain.   General Mental Status - Alert, cooperative and good historian. General Appearance- pleasant. Not in acute distress. Orientation- Oriented X3. Build & Nutrition- Well nourished and Well developed.   Head and Neck Head- normocephalic, atraumatic . Neck Global Assessment- supple. no bruit auscultated on the right and no bruit auscultated on the left.   Eye Vision- Wears corrective lenses. Pupil- Bilateral- Regular and Round. Motion- Bilateral-  EOMI.   Chest and Lung Exam Auscultation: Breath sounds:- clear at anterior chest wall and - clear at posterior chest wall. Adventitious sounds:- No Adventitious sounds.   Cardiovascular Auscultation:Rhythm- Regular rate and rhythm. Heart Sounds- S1 WNL and S2 WNL. Murmurs & Other Heart Sounds:Auscultation of the heart reveals - No Murmurs.   Abdomen Palpation/Percussion:Tenderness- Abdomen is non-tender to palpation. Rigidity (guarding)- Abdomen is soft. Auscultation:Auscultation of the abdomen reveals - Bowel sounds normal.   Male Genitourinary Not done, not pertinent to present illness  Musculoskeletal On exam well developed male, alert and oriented in no apparent distress. His left knee shows about a 10 degree valgus deformity. He is tender lateral greater than medial. Range about 5 to 125. Moderate crepitus on range of motion. There is no instability noted. Pulses sensation and motor are intact.  RADIOGRAPHS Radiographs taken today, AP both knees and lateral of the left, shows that he has significant bone on bone arthritis lateral and patellofemoral compartments of the left knee. The last x rays are from last October, and he has progressed significantly since then.  Assessment & Plan Osteoarthrosis, local, primary, lower leg (715.16) Impression: Left Knee  Note: Plan is for a left total knee replacement by Dr. Wynelle Link.  Plan is to go to [place] at [place].  PCP - Dr. Sarajane Jews - Patient has been seen preoperatively and felt to be stable for surgery. Dr. Sarajane Jews recommended that we use Lovenox for DVT prophylaxis on his clearance letter.  Signed electronically by Jeneen Montgomery, PA-C

## 2012-12-08 ENCOUNTER — Inpatient Hospital Stay (HOSPITAL_COMMUNITY): Payer: Medicare Other | Admitting: Anesthesiology

## 2012-12-08 ENCOUNTER — Inpatient Hospital Stay (HOSPITAL_COMMUNITY)
Admission: RE | Admit: 2012-12-08 | Discharge: 2012-12-11 | DRG: 470 | Disposition: A | Discharge status: Skilled Nursing Facility | Payer: Medicare Other | Source: Ambulatory Visit | Attending: Orthopedic Surgery | Admitting: Orthopedic Surgery

## 2012-12-08 ENCOUNTER — Encounter (HOSPITAL_COMMUNITY)
Admission: RE | Disposition: A | Discharge status: Skilled Nursing Facility | Payer: Self-pay | Source: Ambulatory Visit | Attending: Orthopedic Surgery

## 2012-12-08 ENCOUNTER — Encounter (HOSPITAL_COMMUNITY): Payer: Self-pay | Admitting: Anesthesiology

## 2012-12-08 ENCOUNTER — Encounter (HOSPITAL_COMMUNITY): Payer: Self-pay

## 2012-12-08 DIAGNOSIS — E871 Hypo-osmolality and hyponatremia: Secondary | ICD-10-CM | POA: Diagnosis not present

## 2012-12-08 DIAGNOSIS — I82409 Acute embolism and thrombosis of unspecified deep veins of unspecified lower extremity: Secondary | ICD-10-CM | POA: Diagnosis not present

## 2012-12-08 DIAGNOSIS — S8990XA Unspecified injury of unspecified lower leg, initial encounter: Secondary | ICD-10-CM | POA: Diagnosis not present

## 2012-12-08 DIAGNOSIS — M25569 Pain in unspecified knee: Secondary | ICD-10-CM | POA: Diagnosis not present

## 2012-12-08 DIAGNOSIS — D62 Acute posthemorrhagic anemia: Secondary | ICD-10-CM | POA: Diagnosis not present

## 2012-12-08 DIAGNOSIS — Z86711 Personal history of pulmonary embolism: Secondary | ICD-10-CM

## 2012-12-08 DIAGNOSIS — E876 Hypokalemia: Secondary | ICD-10-CM | POA: Diagnosis not present

## 2012-12-08 DIAGNOSIS — R51 Headache: Secondary | ICD-10-CM | POA: Diagnosis not present

## 2012-12-08 DIAGNOSIS — Z9289 Personal history of other medical treatment: Secondary | ICD-10-CM

## 2012-12-08 DIAGNOSIS — D649 Anemia, unspecified: Secondary | ICD-10-CM | POA: Diagnosis not present

## 2012-12-08 DIAGNOSIS — M1710 Unilateral primary osteoarthritis, unspecified knee: Secondary | ICD-10-CM | POA: Diagnosis not present

## 2012-12-08 DIAGNOSIS — Z86718 Personal history of other venous thrombosis and embolism: Secondary | ICD-10-CM

## 2012-12-08 DIAGNOSIS — Z471 Aftercare following joint replacement surgery: Secondary | ICD-10-CM | POA: Diagnosis not present

## 2012-12-08 DIAGNOSIS — M5430 Sciatica, unspecified side: Secondary | ICD-10-CM | POA: Diagnosis not present

## 2012-12-08 DIAGNOSIS — Z87891 Personal history of nicotine dependence: Secondary | ICD-10-CM

## 2012-12-08 DIAGNOSIS — I10 Essential (primary) hypertension: Secondary | ICD-10-CM | POA: Diagnosis present

## 2012-12-08 DIAGNOSIS — E785 Hyperlipidemia, unspecified: Secondary | ICD-10-CM | POA: Diagnosis present

## 2012-12-08 DIAGNOSIS — N401 Enlarged prostate with lower urinary tract symptoms: Secondary | ICD-10-CM | POA: Diagnosis not present

## 2012-12-08 DIAGNOSIS — IMO0002 Reserved for concepts with insufficient information to code with codable children: Secondary | ICD-10-CM | POA: Diagnosis not present

## 2012-12-08 DIAGNOSIS — E213 Hyperparathyroidism, unspecified: Secondary | ICD-10-CM | POA: Diagnosis not present

## 2012-12-08 DIAGNOSIS — Z96659 Presence of unspecified artificial knee joint: Secondary | ICD-10-CM

## 2012-12-08 DIAGNOSIS — Z5189 Encounter for other specified aftercare: Secondary | ICD-10-CM | POA: Diagnosis not present

## 2012-12-08 DIAGNOSIS — K219 Gastro-esophageal reflux disease without esophagitis: Secondary | ICD-10-CM

## 2012-12-08 DIAGNOSIS — K5730 Diverticulosis of large intestine without perforation or abscess without bleeding: Secondary | ICD-10-CM | POA: Diagnosis not present

## 2012-12-08 DIAGNOSIS — N400 Enlarged prostate without lower urinary tract symptoms: Secondary | ICD-10-CM | POA: Diagnosis not present

## 2012-12-08 HISTORY — PX: TOTAL KNEE ARTHROPLASTY: SHX125

## 2012-12-08 SURGERY — ARTHROPLASTY, KNEE, TOTAL
Anesthesia: Spinal | Site: Knee | Laterality: Left | Wound class: Clean

## 2012-12-08 MED ORDER — LACTATED RINGERS IV SOLN: INTRAVENOUS | Status: DC

## 2012-12-08 MED ORDER — ACETAMINOPHEN 325 MG PO TABS
650.0000 mg | ORAL_TABLET | Freq: Four times a day (QID) | ORAL | Status: DC | PRN
  Filled 2012-12-08: qty 2

## 2012-12-08 MED ORDER — SODIUM CHLORIDE 0.9 % IV SOLN: INTRAVENOUS | Status: DC

## 2012-12-08 MED ORDER — KETAMINE HCL 10 MG/ML IJ SOLN
INTRAMUSCULAR | Status: DC | PRN
  Administered 2012-12-08 (×2): 10 mg via INTRAVENOUS

## 2012-12-08 MED ORDER — FLEET ENEMA 7-19 GM/118ML RE ENEM: 1.0000 | ENEMA | Freq: Once | RECTAL | Status: AC | PRN

## 2012-12-08 MED ORDER — TRAMADOL HCL 50 MG PO TABS: 50.0000 mg | ORAL_TABLET | Freq: Four times a day (QID) | ORAL | Status: DC | PRN

## 2012-12-08 MED ORDER — OXYCODONE HCL 5 MG PO TABS
5.0000 mg | ORAL_TABLET | ORAL | Status: DC | PRN
  Administered 2012-12-08 – 2012-12-09 (×2): 5 mg via ORAL
  Administered 2012-12-09: 10 mg via ORAL
  Administered 2012-12-09: 5 mg via ORAL
  Administered 2012-12-09 – 2012-12-10 (×5): 10 mg via ORAL
  Administered 2012-12-10: 5 mg via ORAL
  Administered 2012-12-11: 10 mg via ORAL
  Filled 2012-12-08: qty 1
  Filled 2012-12-08: qty 2
  Filled 2012-12-08: qty 1
  Filled 2012-12-08 (×3): qty 2
  Filled 2012-12-08: qty 1
  Filled 2012-12-08 (×2): qty 2
  Filled 2012-12-08: qty 1
  Filled 2012-12-08: qty 2

## 2012-12-08 MED ORDER — DOCUSATE SODIUM 100 MG PO CAPS
100.0000 mg | ORAL_CAPSULE | Freq: Two times a day (BID) | ORAL | Status: DC
  Administered 2012-12-08 – 2012-12-11 (×6): 100 mg via ORAL

## 2012-12-08 MED ORDER — DOXAZOSIN MESYLATE 4 MG PO TABS
4.0000 mg | ORAL_TABLET | Freq: Every day | ORAL | Status: DC
  Administered 2012-12-08 – 2012-12-10 (×3): 4 mg via ORAL
  Filled 2012-12-08 (×4): qty 1

## 2012-12-08 MED ORDER — SODIUM CHLORIDE 0.9 % IJ SOLN
INTRAMUSCULAR | Status: DC | PRN
  Administered 2012-12-08: 14:00:00

## 2012-12-08 MED ORDER — PHENOL 1.4 % MT LIQD: 1.0000 | OROMUCOSAL | Status: DC | PRN

## 2012-12-08 MED ORDER — LACTATED RINGERS IV SOLN
INTRAVENOUS | Status: DC | PRN
  Administered 2012-12-08 (×2): via INTRAVENOUS

## 2012-12-08 MED ORDER — DEXAMETHASONE 6 MG PO TABS
10.0000 mg | ORAL_TABLET | Freq: Once | ORAL | Status: AC
  Administered 2012-12-09: 10 mg via ORAL
  Filled 2012-12-08: qty 1

## 2012-12-08 MED ORDER — FUROSEMIDE 40 MG PO TABS
40.0000 mg | ORAL_TABLET | Freq: Every day | ORAL | Status: DC
  Administered 2012-12-09 – 2012-12-11 (×3): 40 mg via ORAL
  Filled 2012-12-08 (×4): qty 1

## 2012-12-08 MED ORDER — ACETAMINOPHEN 10 MG/ML IV SOLN
1000.0000 mg | Freq: Four times a day (QID) | INTRAVENOUS | Status: AC
  Administered 2012-12-08 – 2012-12-09 (×4): 1000 mg via INTRAVENOUS
  Filled 2012-12-08 (×6): qty 100

## 2012-12-08 MED ORDER — HYDROMORPHONE HCL PF 1 MG/ML IJ SOLN: 0.2500 mg | INTRAMUSCULAR | Status: DC | PRN

## 2012-12-08 MED ORDER — BUPIVACAINE LIPOSOME 1.3 % IJ SUSP
20.0000 mL | INTRAMUSCULAR | Status: DC
  Filled 2012-12-08: qty 20

## 2012-12-08 MED ORDER — PANTOPRAZOLE SODIUM 40 MG PO TBEC
40.0000 mg | DELAYED_RELEASE_TABLET | Freq: Two times a day (BID) | ORAL | Status: DC
  Administered 2012-12-08 – 2012-12-11 (×6): 40 mg via ORAL
  Filled 2012-12-08 (×7): qty 1

## 2012-12-08 MED ORDER — DIPHENHYDRAMINE HCL 12.5 MG/5ML PO ELIX: 12.5000 mg | ORAL_SOLUTION | ORAL | Status: DC | PRN

## 2012-12-08 MED ORDER — DEXAMETHASONE SODIUM PHOSPHATE 10 MG/ML IJ SOLN: 10.0000 mg | Freq: Once | INTRAMUSCULAR | Status: AC

## 2012-12-08 MED ORDER — CEFAZOLIN SODIUM-DEXTROSE 2-3 GM-% IV SOLR
2.0000 g | INTRAVENOUS | Status: AC
  Administered 2012-12-08: 2 g via INTRAVENOUS

## 2012-12-08 MED ORDER — ATORVASTATIN CALCIUM 10 MG PO TABS
10.0000 mg | ORAL_TABLET | Freq: Every day | ORAL | Status: DC
  Administered 2012-12-08 – 2012-12-10 (×3): 10 mg via ORAL
  Filled 2012-12-08 (×4): qty 1

## 2012-12-08 MED ORDER — BUPIVACAINE IN DEXTROSE 0.75-8.25 % IT SOLN
INTRATHECAL | Status: DC | PRN
  Administered 2012-12-08: 1.6 mL via INTRATHECAL

## 2012-12-08 MED ORDER — METOCLOPRAMIDE HCL 5 MG/ML IJ SOLN: 5.0000 mg | Freq: Three times a day (TID) | INTRAMUSCULAR | Status: DC | PRN

## 2012-12-08 MED ORDER — ACETAMINOPHEN 650 MG RE SUPP: 650.0000 mg | Freq: Four times a day (QID) | RECTAL | Status: DC | PRN

## 2012-12-08 MED ORDER — PSYLLIUM 95 % PO PACK
1.0000 | PACK | Freq: Every day | ORAL | Status: DC
  Administered 2012-12-09 – 2012-12-10 (×2): 1 via ORAL
  Filled 2012-12-08 (×4): qty 1

## 2012-12-08 MED ORDER — MENTHOL 3 MG MT LOZG: 1.0000 | LOZENGE | OROMUCOSAL | Status: DC | PRN

## 2012-12-08 MED ORDER — RIVAROXABAN 10 MG PO TABS
10.0000 mg | ORAL_TABLET | Freq: Every day | ORAL | Status: DC
  Administered 2012-12-09 – 2012-12-11 (×3): 10 mg via ORAL
  Filled 2012-12-08 (×4): qty 1

## 2012-12-08 MED ORDER — FLUTICASONE PROPIONATE 50 MCG/ACT NA SUSP
2.0000 | Freq: Every day | NASAL | Status: DC
  Administered 2012-12-10 (×2): 2 via NASAL
  Filled 2012-12-08: qty 16

## 2012-12-08 MED ORDER — METHOCARBAMOL 500 MG PO TABS
500.0000 mg | ORAL_TABLET | Freq: Four times a day (QID) | ORAL | Status: DC | PRN
  Administered 2012-12-09 – 2012-12-10 (×4): 500 mg via ORAL
  Filled 2012-12-08 (×4): qty 1

## 2012-12-08 MED ORDER — METHOCARBAMOL 100 MG/ML IJ SOLN
500.0000 mg | Freq: Four times a day (QID) | INTRAMUSCULAR | Status: DC | PRN
  Administered 2012-12-08 (×2): 500 mg via INTRAVENOUS
  Filled 2012-12-08 (×2): qty 5

## 2012-12-08 MED ORDER — DUTASTERIDE 0.5 MG PO CAPS
0.5000 mg | ORAL_CAPSULE | ORAL | Status: DC
  Administered 2012-12-08 – 2012-12-10 (×2): 0.5 mg via ORAL
  Filled 2012-12-08 (×2): qty 1

## 2012-12-08 MED ORDER — BISACODYL 10 MG RE SUPP: 10.0000 mg | Freq: Every day | RECTAL | Status: DC | PRN

## 2012-12-08 MED ORDER — MIDAZOLAM HCL 5 MG/5ML IJ SOLN
INTRAMUSCULAR | Status: DC | PRN
  Administered 2012-12-08: 2 mg via INTRAVENOUS

## 2012-12-08 MED ORDER — PROPOFOL 10 MG/ML IV EMUL
INTRAVENOUS | Status: DC | PRN
  Administered 2012-12-08: 100 ug/kg/min via INTRAVENOUS

## 2012-12-08 MED ORDER — BUPIVACAINE LIPOSOME 1.3 % IJ SUSP
20.0000 mL | Freq: Once | INTRAMUSCULAR | Status: AC
  Filled 2012-12-08: qty 20

## 2012-12-08 MED ORDER — METOCLOPRAMIDE HCL 10 MG PO TABS: 5.0000 mg | ORAL_TABLET | Freq: Three times a day (TID) | ORAL | Status: DC | PRN

## 2012-12-08 MED ORDER — ONDANSETRON HCL 4 MG PO TABS: 4.0000 mg | ORAL_TABLET | Freq: Four times a day (QID) | ORAL | Status: DC | PRN

## 2012-12-08 MED ORDER — BUPIVACAINE ON-Q PAIN PUMP (FOR ORDER SET NO CHG)
INJECTION | Status: DC
  Filled 2012-12-08: qty 1

## 2012-12-08 MED ORDER — MORPHINE SULFATE 2 MG/ML IJ SOLN
1.0000 mg | INTRAMUSCULAR | Status: DC | PRN
  Administered 2012-12-08: 1 mg via INTRAVENOUS
  Filled 2012-12-08: qty 1

## 2012-12-08 MED ORDER — ONDANSETRON HCL 4 MG/2ML IJ SOLN: 4.0000 mg | Freq: Four times a day (QID) | INTRAMUSCULAR | Status: DC | PRN

## 2012-12-08 MED ORDER — CEFAZOLIN SODIUM-DEXTROSE 2-3 GM-% IV SOLR
2.0000 g | Freq: Four times a day (QID) | INTRAVENOUS | Status: AC
  Administered 2012-12-08 – 2012-12-09 (×2): 2 g via INTRAVENOUS
  Filled 2012-12-08 (×2): qty 50

## 2012-12-08 MED ORDER — ONDANSETRON HCL 4 MG/2ML IJ SOLN
INTRAMUSCULAR | Status: DC | PRN
  Administered 2012-12-08: 4 mg via INTRAVENOUS

## 2012-12-08 MED ORDER — SODIUM CHLORIDE 0.9 % IR SOLN
Status: DC | PRN
  Administered 2012-12-08: 1000 mL

## 2012-12-08 MED ORDER — EPHEDRINE SULFATE 50 MG/ML IJ SOLN
INTRAMUSCULAR | Status: DC | PRN
  Administered 2012-12-08: 10 mg via INTRAVENOUS

## 2012-12-08 MED ORDER — ACETAMINOPHEN 10 MG/ML IV SOLN
1000.0000 mg | Freq: Once | INTRAVENOUS | Status: AC
  Administered 2012-12-08: 1000 mg via INTRAVENOUS

## 2012-12-08 MED ORDER — POLYETHYLENE GLYCOL 3350 17 G PO PACK: 17.0000 g | PACK | Freq: Every day | ORAL | Status: DC | PRN

## 2012-12-08 MED ORDER — FENTANYL CITRATE 0.05 MG/ML IJ SOLN
INTRAMUSCULAR | Status: DC | PRN
  Administered 2012-12-08: 100 ug via INTRAVENOUS

## 2012-12-08 MED ORDER — DEXTROSE-NACL 5-0.9 % IV SOLN
INTRAVENOUS | Status: DC
  Administered 2012-12-08 – 2012-12-09 (×3): via INTRAVENOUS

## 2012-12-08 MED ORDER — 0.9 % SODIUM CHLORIDE (POUR BTL) OPTIME
TOPICAL | Status: DC | PRN
  Administered 2012-12-08: 1000 mL

## 2012-12-08 MED ORDER — PROMETHAZINE HCL 25 MG/ML IJ SOLN: 6.2500 mg | INTRAMUSCULAR | Status: DC | PRN

## 2012-12-08 SURGICAL SUPPLY — 60 items
BAG SPEC THK2 15X12 ZIP CLS (MISCELLANEOUS) ×1
BAG ZIPLOCK 12X15 (MISCELLANEOUS) ×2 IMPLANT
BANDAGE ELASTIC 6 VELCRO ST LF (GAUZE/BANDAGES/DRESSINGS) ×2 IMPLANT
BANDAGE ESMARK 6X9 LF (GAUZE/BANDAGES/DRESSINGS) ×1 IMPLANT
BLADE SAG 18X100X1.27 (BLADE) ×2 IMPLANT
BLADE SAW SGTL 11.0X1.19X90.0M (BLADE) ×2 IMPLANT
BNDG CMPR 9X6 STRL LF SNTH (GAUZE/BANDAGES/DRESSINGS) ×1
BNDG ESMARK 6X9 LF (GAUZE/BANDAGES/DRESSINGS) ×2
BOWL SMART MIX CTS (DISPOSABLE) ×2 IMPLANT
CATH KIT ON-Q SILVERSOAK 5 (CATHETERS) ×1 IMPLANT
CATH KIT ON-Q SILVERSOAK 5IN (CATHETERS) IMPLANT
CEMENT HV SMART SET (Cement) ×4 IMPLANT
CLOTH BEACON ORANGE TIMEOUT ST (SAFETY) ×2 IMPLANT
CUFF TOURN SGL QUICK 34 (TOURNIQUET CUFF) ×2
CUFF TRNQT CYL 34X4X40X1 (TOURNIQUET CUFF) ×1 IMPLANT
DRAPE EXTREMITY T 121X128X90 (DRAPE) ×2 IMPLANT
DRAPE POUCH INSTRU U-SHP 10X18 (DRAPES) ×2 IMPLANT
DRAPE U-SHAPE 47X51 STRL (DRAPES) ×2 IMPLANT
DRSG ADAPTIC 3X8 NADH LF (GAUZE/BANDAGES/DRESSINGS) ×2 IMPLANT
DURAPREP 26ML APPLICATOR (WOUND CARE) ×2 IMPLANT
ELECT REM PT RETURN 9FT ADLT (ELECTROSURGICAL) ×2
ELECTRODE REM PT RTRN 9FT ADLT (ELECTROSURGICAL) ×1 IMPLANT
EVACUATOR 1/8 PVC DRAIN (DRAIN) ×2 IMPLANT
FACESHIELD LNG OPTICON STERILE (SAFETY) ×10 IMPLANT
GLOVE BIO SURGEON STRL SZ7.5 (GLOVE) ×1 IMPLANT
GLOVE BIO SURGEON STRL SZ8 (GLOVE) ×2 IMPLANT
GLOVE BIOGEL PI IND STRL 6.5 (GLOVE) IMPLANT
GLOVE BIOGEL PI IND STRL 8 (GLOVE) ×2 IMPLANT
GLOVE BIOGEL PI INDICATOR 6.5 (GLOVE) ×2
GLOVE BIOGEL PI INDICATOR 8 (GLOVE) ×2
GLOVE ECLIPSE 6.5 STRL STRAW (GLOVE) ×1 IMPLANT
GLOVE ECLIPSE 8.0 STRL XLNG CF (GLOVE) ×1 IMPLANT
GLOVE SURG SS PI 6.5 STRL IVOR (GLOVE) ×5 IMPLANT
GOWN STRL NON-REIN LRG LVL3 (GOWN DISPOSABLE) ×4 IMPLANT
GOWN STRL REIN XL XLG (GOWN DISPOSABLE) ×2 IMPLANT
HANDPIECE INTERPULSE COAX TIP (DISPOSABLE) ×2
IMMOBILIZER KNEE 20 (SOFTGOODS) ×2
IMMOBILIZER KNEE 20 THIGH 36 (SOFTGOODS) ×1 IMPLANT
KIT BASIN OR (CUSTOM PROCEDURE TRAY) ×2 IMPLANT
MANIFOLD NEPTUNE II (INSTRUMENTS) ×2 IMPLANT
NDL SAFETY ECLIPSE 18X1.5 (NEEDLE) IMPLANT
NEEDLE HYPO 18GX1.5 SHARP (NEEDLE) ×2
NS IRRIG 1000ML POUR BTL (IV SOLUTION) ×2 IMPLANT
PACK TOTAL JOINT (CUSTOM PROCEDURE TRAY) ×2 IMPLANT
PAD ABD 7.5X8 STRL (GAUZE/BANDAGES/DRESSINGS) ×2 IMPLANT
PADDING CAST COTTON 6X4 STRL (CAST SUPPLIES) ×4 IMPLANT
POSITIONER SURGICAL ARM (MISCELLANEOUS) ×2 IMPLANT
SET HNDPC FAN SPRY TIP SCT (DISPOSABLE) ×1 IMPLANT
SPONGE GAUZE 4X4 12PLY (GAUZE/BANDAGES/DRESSINGS) ×2 IMPLANT
STRIP CLOSURE SKIN 1/2X4 (GAUZE/BANDAGES/DRESSINGS) ×4 IMPLANT
SUCTION FRAZIER 12FR DISP (SUCTIONS) ×2 IMPLANT
SUT MNCRL AB 4-0 PS2 18 (SUTURE) ×2 IMPLANT
SUT VIC AB 2-0 CT1 27 (SUTURE) ×6
SUT VIC AB 2-0 CT1 TAPERPNT 27 (SUTURE) ×3 IMPLANT
SUT VLOC 180 0 24IN GS25 (SUTURE) ×2 IMPLANT
SYR 50ML LL SCALE MARK (SYRINGE) ×1 IMPLANT
TOWEL OR 17X26 10 PK STRL BLUE (TOWEL DISPOSABLE) ×4 IMPLANT
TRAY FOLEY CATH 14FRSI W/METER (CATHETERS) ×2 IMPLANT
WATER STERILE IRR 1500ML POUR (IV SOLUTION) ×3 IMPLANT
WRAP KNEE MAXI GEL POST OP (GAUZE/BANDAGES/DRESSINGS) ×3 IMPLANT

## 2012-12-08 NOTE — Anesthesia Procedure Notes (Signed)
Spinal  Patient location during procedure: OR End time: 12/08/2012 1:21 PM Staffing CRNA/Resident: Noralyn Pick Performed by: anesthesiologist and resident/CRNA  Preanesthetic Checklist Completed: patient identified, site marked, surgical consent, pre-op evaluation, timeout performed, IV checked, risks and benefits discussed and monitors and equipment checked Spinal Block Patient position: sitting Prep: Betadine Patient monitoring: heart rate, continuous pulse ox and blood pressure Approach: midline Location: L3-4 Injection technique: single-shot Needle Needle type: Sprotte  Needle gauge: 24 G Needle length: 9 cm Assessment Sensory level: T6 Additional Notes Expiration date of kit checked and confirmed. Patient tolerated procedure well, without complications.

## 2012-12-08 NOTE — Transfer of Care (Signed)
Immediate Anesthesia Transfer of Care Note  Patient: [name]  Procedure(s) Performed: Procedure(s) (LRB): TOTAL KNEE ARTHROPLASTY (Left)  Patient Location: PACU  Anesthesia Type: Spinal  Level of Consciousness: sedated, patient cooperative and responds to stimulaton  Airway & Oxygen Therapy: Patient Spontanous Breathing and Patient connected to face mask oxgen  Post-op Assessment: Report given to PACU RN and Post -op Vital signs reviewed and stable  Post vital signs: Reviewed and stable  Complications: No apparent anesthesia complications

## 2012-12-08 NOTE — Op Note (Signed)
Pre-operative diagnosis- Osteoarthritis  Left knee(s)  Post-operative diagnosis- Osteoarthritis Left knee(s)  Procedure-  Left  Total Knee Arthroplasty  Surgeon- Dione Plover. Coraleigh Sheeran, MD  Assistant- Arlee Muslim, PA-C   Anesthesia-  Spinal EBL-* No blood loss amount entered *  Drains Hemovac  Tourniquet time-  Total Tourniquet Time Documented: Thigh (Left) - 40 minutes   Complications- None  Condition-PACU - hemodynamically stable.   Brief Clinical Note   [name] is a 79 y.o. year old male with end stage OA of his left knee with progressively worsening pain and dysfunction. He has constant pain, with activity and at rest and significant functional deficits with difficulties even with ADLs. He has had extensive non-op management including analgesics, injections of cortisone and viscosupplements, and home exercise program, but remains in significant pain with significant dysfunction. Radiographs show bone on bone arthritis laeral and patellofemoral without valgus deformity. He presents now for left Total Knee Arthroplasty.     Procedure in detail---   The patient is brought into the operating room and positioned supine on the operating table. After successful administration of  Spinal,   a tourniquet is placed high on the  Left thigh(s) and the lower extremity is prepped and draped in the usual sterile fashion. Time out is performed by the operating team and then the  Left lower extremity is wrapped in Esmarch, knee flexed and the tourniquet inflated to 300 mmHg.       A midline incision is made with a ten blade through the subcutaneous tissue to the level of the extensor mechanism. A fresh blade is used to make a medial parapatellar arthrotomy. Soft tissue over the proximal medial tibia is subperiosteally elevated to the joint line with a knife and into the semimembranosus bursa with a Cobb elevator. Soft tissue over the proximal lateral tibia is elevated with attention being paid to  avoiding the patellar tendon on the tibial tubercle. The patella is everted, knee flexed 90 degrees and the ACL and PCL are removed. Findings are bone on bone lateral and patellofemoral with large global osteophytes.        The drill is used to create a starting hole in the distal femur and the canal is thoroughly irrigated with sterile saline to remove the fatty contents. The 5 degree Left  valgus alignment guide is placed into the femoral canal and the distal femoral cutting block is pinned to remove 11 mm off the distal femur. Resection is made with an oscillating saw.      The tibia is subluxed forward and the menisci are removed. The extramedullary alignment guide is placed referencing proximally at the medial aspect of the tibial tubercle and distally along the second metatarsal axis and tibial crest. The block is pinned to remove 68mm off the more deficient lateral  side. Resection is made with an oscillating saw. Size 6is the most appropriate size for the tibia and the proximal tibia is prepared with the modular drill and keel punch for that size.      The femoral sizing guide is placed and size 5 is most appropriate. Rotation is marked off the epicondylar axis and confirmed by creating a rectangular flexion gap at 90 degrees. The size 5 cutting block is pinned in this rotation and the anterior, posterior and chamfer cuts are made with the oscillating saw. The intercondylar block is then placed and that cut is made.      Trial size 6 tibial component, trial size 5 posterior stabilized  femur and a 12.5  mm posterior stabilized rotating platform insert trial is placed. Full extension is achieved with excellent varus/valgus and anterior/posterior balance throughout full range of motion. The patella is everted and thickness measured to be 27  mm. Free hand resection is taken to 15 mm, a 41 template is placed, lug holes are drilled, trial patella is placed, and it tracks normally. Osteophytes are removed off  the posterior femur with the trial in place. All trials are removed and the cut bone surfaces prepared with pulsatile lavage. Cement is mixed and once ready for implantation, the size 6 tibial implant, size  5 posterior stabilized femoral component, and the size 41 patella are cemented in place and the patella is held with the clamp. The trial insert is placed and the knee held in full extension. The Exparel (20 ml mixed with 50 ml saline) is injected into the extensor mechanism, posterior capsule, medial and lateral gutters and subcutaneous tissues.  All extruded cement is removed and once the cement is hard the permanent 12.5 mm posterior stabilized rotating platform insert is placed into the tibial tray.      The wound is copiously irrigated with saline solution and the extensor mechanism closed over a hemovac drain with #1 PDS suture. The tourniquet is released for a total tourniquet time of 40  minutes. Flexion against gravity is 140 degrees and the patella tracks normally. Subcutaneous tissue is closed with 2.0 vicryl and subcuticular with running 4.0 Monocryl. The incision is cleaned and dried and steri-strips and a bulky sterile dressing are applied. The limb is placed into a knee immobilizer and the patient is awakened and transported to recovery in stable condition.      Please note that a surgical assistant was a medical necessity for this procedure in order to perform it in a safe and expeditious manner. Surgical assistant was necessary to retract the ligaments and vital neurovascular structures to prevent injury to them and also necessary for proper positioning of the limb to allow for anatomic placement of the prosthesis.   Dione Plover Misha Antonini, MD    12/08/2012, 2:23 PM

## 2012-12-08 NOTE — Anesthesia Postprocedure Evaluation (Signed)
  Anesthesia Post-op Note  Patient: [name]  Procedure(s) Performed: Procedure(s) (LRB): TOTAL KNEE ARTHROPLASTY (Left)  Patient Location: PACU  Anesthesia Type: Spinal  Level of Consciousness: awake and alert   Airway and Oxygen Therapy: Patient Spontanous Breathing  Post-op Pain: mild  Post-op Assessment: Post-op Vital signs reviewed, Patient's Cardiovascular Status Stable, Respiratory Function Stable, Patent Airway and No signs of Nausea or vomiting  Last Vitals:  Filed Vitals:   12/08/12 1159  BP: 138/88  Pulse: 73  Temp: 36.8 C  Resp: 16    Post-op Vital Signs: stable   Complications: No apparent anesthesia complications

## 2012-12-08 NOTE — H&P (View-Only) (Signed)
[name]  DOB: 1933-04-28 Married / Language: English / Race: White Male  Date of Admission: 12/08/2012  Chief Complaint:  Left Knee Pain  History of Present Illness The patient is a 79 year old male who comes in for a preoperative History and Physical. The patient is scheduled for a left total knee arthroplasty to be performed by Dr. Dione Plover. Aluisio, MD at [facility] on 12/08/2012. The patient is a 79 year old male who presents today for follow up of their knee. The patient is being followed for their left knee pain and osteoarthritis. Symptoms reported today include: pain. The patient feels that they are doing well and report their pain level to be mild to moderate. Current treatment includes: NSAIDs (naproxen with voltaren gel) and icing daily. The patient has reported improvement of their symptoms with: Cortisone injections. The cortisone has worn off. The Visco supplements in the past have not worked. He is having increasing pain and increasing difficulties with functions. He is at a stage now where he feels like he needs to do something further. His favorite past time is playing golf, and he cannot even complete a round of golf anymore. He feels like the knee is the only thing holding him back. He did great with his right knee replacement.  He is now ready to proceed with the left knee replacement They have been treated conservatively in the past for the above stated problem and despite conservative measures, they continue to have progressive pain and severe functional limitations and dysfunction. They have failed non-operative management including home exercise, medications, and injections. It is felt that they would benefit from undergoing total joint replacement. Risks and benefits of the procedure have been discussed with the patient and they elect to proceed with surgery. There are no active contraindications to surgery such as ongoing infection or rapidly  progressive neurological disease.  Problem List Osteoarthrosis NOS, lower leg (715.96)  Allergies No Known Drug Allergies   Family History Chronic Obstructive Lung Disease. brother Osteoarthritis. mother   Social History Exercise. Exercises daily; does running / walking and gym / weights Drug/Alcohol Rehab (Previously). no Drug/Alcohol Rehab (Currently). no Number of flights of stairs before winded. 2-3 Pain Contract. no Illicit drug use. no Current work status. retired Careers information officer. 4 Alcohol use. current drinker; drinks wine; 8-14 per week Tobacco / smoke exposure. no Tobacco use. former smoker; smoke(d) 3/4 pack(s) per day Living situation. live with spouse Marital status. married Post-Surgical Plans. Plan is to go to [place] at [place]. Advance Directives. Living Will, Healthcare POA   Medication History Aspirin EC (325MG  Tablet DR, Oral) Active. Atorvastatin Calcium (10MG  Tablet, Oral) Active. Doxazosin Mesylate (4MG  Tablet, Oral) Active. Avodart (0.5MG  Capsule, Oral) Active. Fluticasone Propionate (50MCG/ACT Suspension, Nasal) Active. Furosemide (40MG  Tablet, Oral) Active. Naproxen ( Oral) Specific dose unknown - Active. Pantoprazole Sodium (40MG  Tablet DR, Oral) Active. MiraLax ( Oral) Active. Metamucil (30.9% Powder, Oral) Active.   Past Surgical History Arthroscopy of Knee. left Total Knee Replacement. right Vasectomy   Medical History Osteoarthritis Migraine Headache High blood pressure Hypercholesterolemia Edema Hypercalcemia Diverticulosis Gastroesophageal Reflux Disease Enlarged prostate Diet-Controlled Diabetes Mellitus Hyperparathyroidism, primary (252.01) History of pulmonary embolism (V12.55) History of DVT of lower extremity (V12.51) ED (erectile dysfunction) (607.84)   Review of Systems General:Not Present- Chills, Fever, Night Sweats, Fatigue, Weight Gain, Weight Loss and Memory Loss. Skin:Not  Present- Hives, Itching, Rash, Eczema and Lesions. HEENT:Not Present- Tinnitus, Headache, Double Vision, Visual Loss, Hearing Loss and Dentures. Respiratory:Not Present-  Shortness of breath with exertion, Shortness of breath at rest, Allergies, Coughing up blood and Chronic Cough. Cardiovascular:Not Present- Chest Pain, Racing/skipping heartbeats, Difficulty Breathing Lying Down, Murmur, Swelling and Palpitations. Gastrointestinal:Not Present- Bloody Stool, Heartburn, Abdominal Pain, Vomiting, Nausea, Constipation, Diarrhea, Difficulty Swallowing, Jaundice and Loss of appetitie. Male Genitourinary:Not Present- Urinary frequency, Blood in Urine, Weak urinary stream, Discharge, Flank Pain, Incontinence, Painful Urination, Urgency, Urinary Retention and Urinating at Night. Musculoskeletal:Not Present- Muscle Weakness, Muscle Pain, Joint Swelling, Joint Pain, Back Pain, Morning Stiffness and Spasms. Neurological:Not Present- Tremor, Dizziness, Blackout spells, Paralysis, Difficulty with balance and Weakness. Psychiatric:Not Present- Insomnia.   Vitals Weight: 215 lb Height: 71 in Weight was reported by patient. Height was reported by patient. Body Surface Area: 2.21 m Body Mass Index: 29.99 kg/m Pulse: 76 (Regular) Resp.: 16 (Unlabored) BP: 132/86 (Sitting, Left Arm, Standard)    Physical Exam The physical exam findings are as follows:  Note: Patient is a 79 year old male with continued knee pain.   General Mental Status - Alert, cooperative and good historian. General Appearance- pleasant. Not in acute distress. Orientation- Oriented X3. Build & Nutrition- Well nourished and Well developed.   Head and Neck Head- normocephalic, atraumatic . Neck Global Assessment- supple. no bruit auscultated on the right and no bruit auscultated on the left.   Eye Vision- Wears corrective lenses. Pupil- Bilateral- Regular and Round. Motion- Bilateral-  EOMI.   Chest and Lung Exam Auscultation: Breath sounds:- clear at anterior chest wall and - clear at posterior chest wall. Adventitious sounds:- No Adventitious sounds.   Cardiovascular Auscultation:Rhythm- Regular rate and rhythm. Heart Sounds- S1 WNL and S2 WNL. Murmurs & Other Heart Sounds:Auscultation of the heart reveals - No Murmurs.   Abdomen Palpation/Percussion:Tenderness- Abdomen is non-tender to palpation. Rigidity (guarding)- Abdomen is soft. Auscultation:Auscultation of the abdomen reveals - Bowel sounds normal.   Male Genitourinary Not done, not pertinent to present illness  Musculoskeletal On exam well developed male, alert and oriented in no apparent distress. His left knee shows about a 10 degree valgus deformity. He is tender lateral greater than medial. Range about 5 to 125. Moderate crepitus on range of motion. There is no instability noted. Pulses sensation and motor are intact.  RADIOGRAPHS Radiographs taken today, AP both knees and lateral of the left, shows that he has significant bone on bone arthritis lateral and patellofemoral compartments of the left knee. The last x rays are from last October, and he has progressed significantly since then.  Assessment & Plan Osteoarthrosis, local, primary, lower leg (715.16) Impression: Left Knee  Note: Plan is for a left total knee replacement by Dr. Wynelle Link.  Plan is to go to [place] at [place].  PCP - Dr. Sarajane Jews - Patient has been seen preoperatively and felt to be stable for surgery. Dr. Sarajane Jews recommended that we use Lovenox for DVT prophylaxis on his clearance letter.  Signed electronically by Jeneen Montgomery, PA-C

## 2012-12-08 NOTE — Anesthesia Preprocedure Evaluation (Signed)
Anesthesia Evaluation  Patient identified by MRN, date of birth, ID band Patient awake    Reviewed: Allergy & Precautions, H&P , NPO status , Patient's Chart, lab work & pertinent test results  Airway Mallampati: II TM Distance: <3 FB Neck ROM: Full    Dental No notable dental hx.    Pulmonary neg pulmonary ROS,  breath sounds clear to auscultation  Pulmonary exam normal       Cardiovascular hypertension, Pt. on medications DVT Rhythm:Regular Rate:Normal     Neuro/Psych negative neurological ROS  negative psych ROS   GI/Hepatic Neg liver ROS, GERD-  Medicated,  Endo/Other  negative endocrine ROS  Renal/GU negative Renal ROS  negative genitourinary   Musculoskeletal negative musculoskeletal ROS (+)   Abdominal   Peds negative pediatric ROS (+)  Hematology negative hematology ROS (+)   Anesthesia Other Findings   Reproductive/Obstetrics negative OB ROS                           Anesthesia Physical Anesthesia Plan  ASA: II  Anesthesia Plan: Spinal   Post-op Pain Management:    Induction:   Airway Management Planned: Simple Face Mask  Additional Equipment:   Intra-op Plan:   Post-operative Plan:   Informed Consent: I have reviewed the patients History and Physical, chart, labs and discussed the procedure including the risks, benefits and alternatives for the proposed anesthesia with the patient or authorized representative who has indicated his/her understanding and acceptance.     Plan Discussed with: CRNA and Surgeon  Anesthesia Plan Comments:         Anesthesia Quick Evaluation

## 2012-12-08 NOTE — Interval H&P Note (Signed)
History and Physical Interval Note:  12/08/2012 12:59 PM  [name]  has presented today for surgery, with the diagnosis of osteoarthritits left knee  The various methods of treatment have been discussed with the patient and family. After consideration of risks, benefits and other options for treatment, the patient has consented to  Procedure(s) (LRB) with comments: TOTAL KNEE ARTHROPLASTY (Left) as a surgical intervention .  The patient's history has been reviewed, patient examined, no change in status, stable for surgery.  I have reviewed the patient's chart and labs.  Questions were answered to the patient's satisfaction.     [name]

## 2012-12-09 ENCOUNTER — Encounter (HOSPITAL_COMMUNITY): Payer: Self-pay | Admitting: Orthopedic Surgery

## 2012-12-09 LAB — CBC
HCT: 27.2 % — ABNORMAL LOW (ref 39.0–52.0)
Hemoglobin: 9.2 g/dL — ABNORMAL LOW (ref 13.0–17.0)
RBC: 2.76 MIL/uL — ABNORMAL LOW (ref 4.22–5.81)
WBC: 6.9 10*3/uL (ref 4.0–10.5)

## 2012-12-09 LAB — BASIC METABOLIC PANEL
BUN: 11 mg/dL (ref 6–23)
CO2: 28 mEq/L (ref 19–32)
Chloride: 101 mEq/L (ref 96–112)
Glucose, Bld: 131 mg/dL — ABNORMAL HIGH (ref 70–99)
Potassium: 3.5 mEq/L (ref 3.5–5.1)

## 2012-12-09 MED ORDER — POLYSACCHARIDE IRON COMPLEX 150 MG PO CAPS
150.0000 mg | ORAL_CAPSULE | Freq: Every day | ORAL | Status: DC
  Administered 2012-12-09: 150 mg via ORAL
  Filled 2012-12-09 (×3): qty 1

## 2012-12-09 MED ORDER — POLYETHYLENE GLYCOL 3350 17 G PO PACK
17.0000 g | PACK | Freq: Every day | ORAL | Status: DC
  Administered 2012-12-09 – 2012-12-11 (×3): 17 g via ORAL
  Filled 2012-12-09: qty 1

## 2012-12-09 NOTE — Evaluation (Signed)
Physical Therapy Evaluation Patient Details Name: [name] MRN: [id number] DOB: 13-Nov-1933 Today's Date: 12/09/2012 Time: [id number] PT Time Calculation (min): 26 min  PT Assessment / Plan / Recommendation Clinical Impression  Pt. is 79 yo male admitted 12/08/12 for LTKA. Pt. tolerated ambulationin hall with min increase in pain. Pt.plans SNF rehab at [place] . Pt will benefit fromPT while in acute care.    PT Assessment  Patient needs continued PT services    Follow Up Recommendations  SNF    Does the patient have the potential to tolerate intense rehabilitation      Barriers to Discharge        Equipment Recommendations  None recommended by PT    Recommendations for Other Services     Frequency 7X/week    Precautions / Restrictions Precautions Precautions: Knee Required Braces or Orthoses: Knee Immobilizer - Left Knee Immobilizer - Left: Discontinue once straight leg raise with < 10 degree lag Restrictions Weight Bearing Restrictions: No   Pertinent Vitals/Pain 3-4 L knee, ice applied      Mobility  Bed Mobility Bed Mobility: Supine to Sit Supine to Sit: 4: Min assist Details for Bed Mobility Assistance: cues for moving on bed using UE's Transfers Transfers: Sit to Stand;Stand to Sit Sit to Stand: 4: Min assist;From bed Stand to Sit: With upper extremity assist;To chair/3-in-1;4: Min assist Details for Transfer Assistance: cues for use of UE's for sit/stand. Cues for LLE postion to sit down. Ambulation/Gait Ambulation/Gait Assistance: 4: Min assist Ambulation Distance (Feet): 60 Feet Assistive device: Rolling walker Ambulation/Gait Assistance Details: cues for gait sequence and step length and posture. Gait Pattern: Step-to pattern;Decreased stance time - left;Trunk flexed Gait velocity: decreased    Shoulder Instructions     Exercises     PT Diagnosis: Difficulty walking;Acute pain  PT Problem List: Decreased strength;Decreased range of  motion;Decreased activity tolerance;Decreased mobility;Pain;Decreased knowledge of use of DME;Decreased safety awareness;Decreased knowledge of precautions PT Treatment Interventions: DME instruction;Gait training;Functional mobility training;Therapeutic activities;Patient/family education   PT Goals Acute Rehab PT Goals PT Goal Formulation: With patient Time For Goal Achievement: 12/16/12 Potential to Achieve Goals: Good Pt will go Supine/Side to Sit: with modified independence PT Goal: Supine/Side to Sit - Progress: Goal set today Pt will go Sit to Supine/Side: with modified independence PT Goal: Sit to Supine/Side - Progress: Goal set today Pt will go Sit to Stand: with supervision PT Goal: Sit to Stand - Progress: Goal set today Pt will go Stand to Sit: with supervision PT Goal: Stand to Sit - Progress: Goal set today Pt will Ambulate: 51 - 150 feet;with supervision;with rolling walker PT Goal: Ambulate - Progress: Goal set today Pt will Perform Home Exercise Program: with supervision, verbal cues required/provided PT Goal: Perform Home Exercise Program - Progress: Goal set today  Visit Information  Last PT Received On: 12/09/12 Assistance Needed: +1    Subjective Data  Subjective: I felt a little woozy. Patient Stated Goal: to walk today   Prior [place] Lives With: Spouse Available Help at Discharge: [place] (planned) Type of Home: House Home Access: Stairs to enter [employer] of Steps: 1/2 step Home Layout: Two level;Able to live on main level with bedroom/bathroom Bathroom Shower/Tub: Multimedia programmer: Standard Home Adaptive Equipment: Walker - rolling Prior Function Level of Independence: Independent Able to Take Stairs?: Yes Driving: Yes Vocation: Retired Corporate investment banker: No difficulties Dominant Hand: Right    Cognition  Overall Cognitive Status: Appears within functional  limits for  tasks assessed/performed    Extremity/Trunk Assessment Right Lower Extremity Assessment RLE ROM/Strength/Tone: WFL for tasks assessed RLE Sensation: WFL - Light Touch Left Lower Extremity Assessment LLE ROM/Strength/Tone: Deficits LLE ROM/Strength/Tone Deficits: pt is able to perfaorm SLR with no assistance.   Balance    End of Session PT - End of Session Equipment Utilized During Treatment: Left knee immobilizer Activity Tolerance: Patient tolerated treatment well Patient left: in chair;with call bell/phone within reach;with family/visitor present Nurse Communication: Mobility status CPM Left Knee CPM Left Knee: Off  GP     Claretha Cooper 12/09/2012, 10:41 AM  [phone]

## 2012-12-09 NOTE — Progress Notes (Signed)
Physical Therapy Treatment Patient Details Name: [name] MRN: [id number] DOB: 1932-12-18 Today's Date: 12/09/2012 Time: [id number] PT Time Calculation (min): 22 min  PT Assessment / Plan / Recommendation Comments on Treatment Session  Pt. continues to do well with minimal pain c/o.     Follow Up Recommendations  SNF     Does the patient have the potential to tolerate intense rehabilitation     Barriers to Discharge        Equipment Recommendations  None recommended by PT    Recommendations for Other Services    Frequency 7X/week   Plan Discharge plan remains appropriate;Frequency remains appropriate    Precautions / Restrictions Precautions Precautions: Knee Required Braces or Orthoses:  (did nt use) Knee Immobilizer - Left: Discontinue once straight leg raise with < 10 degree lag   Pertinent Vitals/Pain Pain is < 3 after meds. Ice applied.    Mobility  Bed Mobility Bed Mobility: Sit to Supine Sit to Supine: 4: Min guard Details for Bed Mobility Assistance: pt was able to lift leg onto bed. Transfers Sit to Stand: 4: Min guard;With upper extremity assist;With armrests;From chair/3-in-1 Stand to Sit: To bed;With upper extremity assist;4: Min guard Details for Transfer Assistance: cues for LLE position as pt did not have KI  Ambulation/Gait Ambulation/Gait Assistance: 4: Min guard Ambulation Distance (Feet): 6 Feet Assistive device: Rolling walker Ambulation/Gait Assistance Details: cueas for posture., sequence  Gait Pattern: Step-to pattern;Decreased stance time - left;Trunk flexed    Exercises Total Joint Exercises Quad Sets: AROM;Left;20 reps Heel Slides: AROM;Left;Supine;10 reps Straight Leg Raises: AROM;Left;10 reps;Supine Long Arc Quad: AROM;Left;10 reps;Seated   PT Diagnosis:    PT Problem List:   PT Treatment Interventions:     PT Goals Acute Rehab PT Goals Pt will go Sit to Supine/Side: with modified independence PT Goal: Sit to  Supine/Side - Progress: Progressing toward goal Pt will go Sit to Stand: with supervision PT Goal: Sit to Stand - Progress: Progressing toward goal Pt will go Stand to Sit: with modified independence PT Goal: Stand to Sit - Progress: Progressing toward goal Pt will Ambulate: 51 - 150 feet;with supervision;with rolling walker PT Goal: Ambulate - Progress: Progressing toward goal Pt will Perform Home Exercise Program: with supervision, verbal cues required/provided PT Goal: Perform Home Exercise Program - Progress: Progressing toward goal  Visit Information  Last PT Received On: 12/09/12 Assistance Needed: +1    Subjective Data  Subjective: I am ready to do what I need to.   Cognition  Overall Cognitive Status: Appears within functional limits for tasks assessed/performed    Balance     End of Session PT - End of Session Equipment Utilized During Treatment:  (did not use KI) Activity Tolerance: Patient tolerated treatment well Patient left: in bed;with call bell/phone within reach Nurse Communication: Mobility status   GP     Claretha Cooper 12/09/2012, 2:54 PM

## 2012-12-09 NOTE — Progress Notes (Signed)
Clinical Social Work Department CLINICAL SOCIAL WORK PLACEMENT NOTE 12/09/2012  Patient:  [name], [name]  Account Number:  [id number] Admit date:  12/08/2012  Clinical Social Worker:  Werner Lean, LCSW  Date/time:  12/09/2012 02:21 PM  Clinical Social Work is seeking post-discharge placement for this patient at the following level of care:   SKILLED NURSING   (*CSW will update this form in Epic as items are completed)     Patient/family provided with [place] Department of Clinical Social Work's list of facilities offering this level of care within the geographic area requested by the patient (or if unable, by the patient's family).  12/09/2012  Patient/family informed of their freedom to choose among providers that offer the needed level of care, that participate in Medicare, Medicaid or managed care program needed by the patient, have an available bed and are willing to accept the patient.    Patient/family informed of [facility]' ownership interest in [facility], as well as of the fact that they are under no obligation to receive care at this facility.  PASARR submitted to EDS on 12/09/2012 PASARR number received from [employer] on 12/09/2012  FL2 transmitted to all facilities in geographic area requested by pt/family on  12/09/2012 FL2 transmitted to all facilities within larger geographic area on   Patient informed that his/her managed care company has contracts with or will negotiate with  certain facilities, including the following:     Patient/family informed of bed offers received:  12/09/2012 Patient chooses bed at [facility] Physician recommends and patient chooses bed at    Patient to be transferred to [facility] on   Patient to be transferred to facility by   The following physician request were entered in Epic:   Additional Comments:  Werner Lean LCSW [phone]

## 2012-12-09 NOTE — Care Management Note (Signed)
    Page 1 of 2   12/09/2012     1:29:18 PM   CARE MANAGEMENT NOTE 12/09/2012  Patient:  [name], [name]   Account Number:  [id number]  Date Initiated:  12/09/2012  Documentation initiated by:  Sherrin Daisy  Subjective/Objective Assessment:   dx osteoarthritis left knee; total knee replacemnt on day of admission     Action/Plan:   CM spoke with patient. Plans are for patient to go to SNF rehab upon discharge   Anticipated DC Date:  12/11/2012   Anticipated DC Plan:  [place]  In-house referral  Clinical Social Worker      DC Planning Services  CM consult      [facility] Choice  NA   Choice offered to / List presented to:  NA   DME arranged  NA      DME agency  NA     [place] arranged  NA      [place] agency  NA   Status of service:  Completed, signed off Medicare Important Message given?  NA - LOS <3 / Initial given by admissions (If response is "NO", the following Medicare IM given date fields will be blank) Date Medicare IM given:   Date Additional Medicare IM given:    Discharge Disposition:    Per UR Regulation:    If discussed at Long Length of Stay Meetings, dates discussed:    Comments:

## 2012-12-09 NOTE — Progress Notes (Signed)
Physical Therapy Treatment Patient Details Name: [name] MRN: [id number] DOB: 1933-02-26 Today's Date: 12/09/2012 Time: [id number] PT Time Calculation (min): 26 min  PT Assessment / Plan / Recommendation Comments on Treatment Session  Pt. tolerated very well, able to ambulate w/out KI and did well.able to extend knee against gravity.    Follow Up Recommendations  SNF     Does the patient have the potential to tolerate intense rehabilitation     Barriers to Discharge        Equipment Recommendations  None recommended by PT    Recommendations for Other Services    Frequency 7X/week   Plan Discharge plan remains appropriate    Precautions / Restrictions Precautions Precautions: Knee Required Braces or Orthoses: Knee Immobilizer - Left Knee Immobilizer - Left: Discontinue once straight leg raise with < 10 degree lag Restrictions Weight Bearing Restrictions: No   Pertinent Vitals/Pain Sore, ice applied.    Mobility  Bed Mobility Bed Mobility: Supine to Sit Supine to Sit: 4: Min assist Details for Bed Mobility Assistance: cues for moving on bed using UE's Transfers Transfers: Sit to Stand;Stand to Sit Sit to Stand: 4: Min guard;With upper extremity assist;From chair/3-in-1 Stand to Sit: To chair/3-in-1;With upper extremity assist;5: Supervision Details for Transfer Assistance: cues for LLE position as pt did not have KI Ambulation/Gait Ambulation/Gait Assistance: 4: Min guard Ambulation Distance (Feet): 60 Feet Assistive device: Rolling walker Ambulation/Gait Assistance Details: cueas for posture., sequence Gait Pattern: Step-to pattern;Decreased stance time - left;Trunk flexed Gait velocity: decreased    Exercises Total Joint Exercises Quad Sets: AROM;Left;20 reps Heel Slides: AROM;Left;10 reps;Seated Long Arc Quad: AROM;Left;10 reps;Seated   PT Diagnosis: Difficulty walking;Acute pain  PT Problem List: Decreased strength;Decreased range of  motion;Decreased activity tolerance;Decreased mobility;Pain;Decreased knowledge of use of DME;Decreased safety awareness;Decreased knowledge of precautions PT Treatment Interventions: DME instruction;Gait training;Functional mobility training;Therapeutic activities;Patient/family education   PT Goals Acute Rehab PT Goals PT Goal Formulation: With patient Time For Goal Achievement: 12/16/12 Potential to Achieve Goals: Good Pt will go Supine/Side to Sit: with modified independence PT Goal: Supine/Side to Sit - Progress: Goal set today Pt will go Sit to Supine/Side: with modified independence PT Goal: Sit to Supine/Side - Progress: Goal set today Pt will go Sit to Stand: with supervision PT Goal: Sit to Stand - Progress: Progressing toward goal Pt will go Stand to Sit: with modified independence PT Goal: Stand to Sit - Progress: Updated due to goals met Pt will Ambulate: 51 - 150 feet;with supervision;with rolling walker PT Goal: Ambulate - Progress: Goal set today Pt will Perform Home Exercise Program: with supervision, verbal cues required/provided PT Goal: Perform Home Exercise Program - Progress: Progressing toward goal  Visit Information  Last PT Received On: 12/09/12 Assistance Needed: +1    Subjective Data  Subjective: can I do these exercises? Patient Stated Goal: to walk today   Cognition  Overall Cognitive Status: Appears within functional limits for tasks assessed/performed Arousal/Alertness: Awake/alert Orientation Level: Appears intact for tasks assessed Behavior During Session: [facility] for tasks performed    Balance     End of Session PT - End of Session Equipment Utilized During Treatment:  (did not use KI) Activity Tolerance: Patient tolerated treatment well Patient left: in chair;with call bell/phone within reach Nurse Communication: Mobility status CPM Left Knee CPM Left Knee: Off   GP     Claretha Cooper 12/09/2012, 11:56 AM

## 2012-12-09 NOTE — Evaluation (Signed)
Occupational Therapy Evaluation Patient Details Name: [name] MRN: [id number] DOB: 1933-06-02 Today's Date: 12/09/2012 Time: [id number] OT Time Calculation (min): 26 min  OT Assessment / Plan / Recommendation Clinical Impression  Pt doing well POD 1 L TKR. Skilled OT recommended to maximize independence with BADLs to supervision/min A level in prep for d/c to next venue of care.    OT Assessment  Patient needs continued OT Services    Follow Up Recommendations  SNF;Supervision/Assistance - 24 hour    Barriers to Discharge Decreased caregiver support    Equipment Recommendations  3 in 1 bedside comode    Recommendations for Other Services    Frequency  Min 2X/week    Precautions / Restrictions Precautions Precautions: Knee Required Braces or Orthoses: Knee Immobilizer - Left Knee Immobilizer - Left: Discontinue once straight leg raise with < 10 degree lag Restrictions Weight Bearing Restrictions: No   Pertinent Vitals/Pain Pt reported 4/10 pain in L knee. Repositioned and cold applied.    ADL  Grooming: Set up Where Assessed - Grooming: Unsupported sitting Upper Body Bathing: Set up Where Assessed - Upper Body Bathing: Unsupported sitting Lower Body Bathing: Moderate assistance Where Assessed - Lower Body Bathing: Supported sit to stand Upper Body Dressing: Set up Where Assessed - Upper Body Dressing: Unsupported sitting Lower Body Dressing: Maximal assistance Where Assessed - Lower Body Dressing: Supported sit to stand Toilet Transfer: Minimal assistance Toilet Transfer Method: Sit to Loss adjuster, chartered: Raised toilet seat with arms (or 3-in-1 over toilet) Toileting - Clothing Manipulation and Hygiene: Minimal assistance Where Assessed - Toileting Clothing Manipulation and Hygiene: Sit to stand from 3-in-1 or toilet Transfers/Ambulation Related to ADLs: Pt ambulated to the bathroom with min A and RW. Min cues for step sequence and technique.    OT Diagnosis: Generalized weakness  OT Problem List: Decreased activity tolerance;Decreased knowledge of use of DME or AE OT Treatment Interventions: Self-care/ADL training;Therapeutic activities;DME and/or AE instruction;Patient/family education   OT Goals Acute Rehab OT Goals OT Goal Formulation: With patient Time For Goal Achievement: 12/16/12 Potential to Achieve Goals: Good ADL Goals Pt Will Perform Grooming: with supervision;Standing at sink ADL Goal: Grooming - Progress: Goal set today Pt Will Perform Lower Body Bathing: with supervision;Sit to stand from chair;Sit to stand from bed ADL Goal: Lower Body Bathing - Progress: Goal set today Pt Will Perform Lower Body Dressing: with min assist;Sit to stand from chair;Sit to stand from bed ADL Goal: Lower Body Dressing - Progress: Goal set today Pt Will Transfer to Toilet: with min assist;Ambulation;with DME ADL Goal: Toilet Transfer - Progress: Goal set today Pt Will Perform Toileting - Clothing Manipulation: with supervision;Sitting on 3-in-1 or toilet;Standing ADL Goal: Toileting - Clothing Manipulation - Progress: Goal set today Pt Will Perform Toileting - Hygiene: with supervision;Sit to stand from 3-in-1/toilet ADL Goal: Toileting - Hygiene - Progress: Goal set today  Visit Information  Last OT Received On: 12/09/12 Assistance Needed: +1 PT/OT Co-Evaluation/Treatment: Yes    Subjective Data  Subjective: "My wife doesn't want to deal with me this time around." Patient Stated Goal: Go to [facility] for st rehab.   Prior Functioning     Home Living Lives With: Spouse Available Help at Discharge: [place] (planned) Type of Home: House Home Access: Stairs to enter [employer] of Steps: 1/2 step Home Layout: Two level;Able to live on main level with bedroom/bathroom Bathroom Shower/Tub: Multimedia programmer: Standard Home Adaptive Equipment: Walker - rolling Prior Function Level  of Independence: Independent Able to Take Stairs?: Yes Driving: Yes Vocation: Retired Corporate investment banker: No difficulties Dominant Hand: Right         Vision/Perception     Cognition  Overall Cognitive Status: Appears within functional limits for tasks assessed/performed Arousal/Alertness: Awake/alert Orientation Level: Appears intact for tasks assessed Behavior During Session: [facility] for tasks performed    Extremity/Trunk Assessment Right Upper Extremity Assessment RUE ROM/Strength/Tone: [facility] for tasks assessed Left Upper Extremity Assessment LUE ROM/Strength/Tone: WFL for tasks assessed Right Lower Extremity Assessment RLE ROM/Strength/Tone: WFL for tasks assessed RLE Sensation: WFL - Light Touch Left Lower Extremity Assessment LLE ROM/Strength/Tone: Deficits LLE ROM/Strength/Tone Deficits: pt is able to perfaorm SLR with no assistance.     Mobility Bed Mobility Bed Mobility: Supine to Sit Supine to Sit: 4: Min assist Details for Bed Mobility Assistance: cues for moving on bed using UE's Transfers Sit to Stand: 4: Min assist;From bed Stand to Sit: With upper extremity assist;To chair/3-in-1;4: Min assist Details for Transfer Assistance: cues for use of UE's for sit/stand. Cues for LLE postion to sit down.     Shoulder Instructions     Exercise     Balance     End of Session OT - End of Session Activity Tolerance: Patient tolerated treatment well Patient left: in chair;with call bell/phone within reach CPM Left Knee CPM Left Knee: Off  [place] A OTR/L [id number] 12/09/2012, 10:49 AM

## 2012-12-09 NOTE — Progress Notes (Signed)
Clinical Social Work Department BRIEF PSYCHOSOCIAL ASSESSMENT 12/09/2012  Patient:  [name], [name]     Account Number:  [id number]     Admit date:  12/08/2012  Clinical Social Worker:  [name]  Date/Time:  12/09/2012 02:14 PM  Referred by:  CSW  Date Referred:  12/09/2012 Referred for  SNF Placement   Other Referral:   Interview type:  Patient Other interview type:    PSYCHOSOCIAL DATA Living Status:  FACILITY Admitted from facility:  [facility] Level of care:  Independent Living Primary support name:  [name] Primary support relationship to patient:  SPOUSE Degree of support available:   unclear    CURRENT CONCERNS Current Concerns  Post-Acute Placement   Other Concerns:    SOCIAL WORK ASSESSMENT / PLAN Pt is a 79 yr old gentleman living at home prior to hospitalization. CSW met with pt to assist with d/c planning. Pt has made arrangements to have ST Rehab at [facility] following hospital d/c. CSW contacted SNF and d/c plan has been confirmed. CSW will follow to assist with d/c planning needs.   Assessment/plan status:  Psychosocial Support/Ongoing Assessment of Needs Other assessment/ plan:   Information/referral to community resources:   None needed at this time.    PATIENT'S/FAMILY'S RESPONSE TO PLAN OF CARE: Pt is looking forward to feeling better and having rehab at [facility].    Werner Lean LCSW [phone]

## 2012-12-09 NOTE — Progress Notes (Signed)
Utilization review completed.  

## 2012-12-09 NOTE — Progress Notes (Signed)
   Subjective: 1 Day Post-Op Procedure(s) (LRB): TOTAL KNEE ARTHROPLASTY (Left) Patient reports pain as mild.   Patient seen in rounds with Dr. Wynelle Link. Patient is well, and has had no acute complaints or problems We will start therapy today.  Plan is to go Skilled nursing facility after hospital stay.  Objective: Vital signs in last 24 hours: Temp:  [97.4 F (36.3 C)-98.5 F (36.9 C)] 98.5 F (36.9 C) (01/14 0536) Pulse Rate:  [44-83] 69  (01/14 0536) Resp:  [10-18] 16  (01/14 0536) BP: (103-157)/(60-88) 108/63 mmHg (01/14 0536) SpO2:  [98 %-100 %] 98 % (01/14 0536) Weight:  [96.571 kg (212 lb 14.4 oz)] 96.571 kg (212 lb 14.4 oz) (01/13 1749)  Intake/Output from previous day:  Intake/Output Summary (Last 24 hours) at 12/09/12 0834 Last data filed at 12/09/12 0800  Gross per 24 hour  Intake   4755 ml  Output    825 ml  Net   3930 ml    Intake/Output this shift: UOP 300 since MN Total I/O In: 225 [I.V.:225] Out: -   Labs:  Basename 12/09/12 0430  HGB 9.2*    Basename 12/09/12 0430  WBC 6.9  RBC 2.76*  HCT 27.2*  PLT 167    Basename 12/09/12 0430  NA 134*  K 3.5  CL 101  CO2 28  BUN 11  CREATININE 0.83  GLUCOSE 131*  CALCIUM 9.8   No results found for this basename: LABPT:2,INR:2 in the last 72 hours  EXAM General - Patient is Alert, Appropriate and Oriented Extremity - Neurovascular intact Sensation intact distally Dorsiflexion/Plantar flexion intact Dressing - dressing C/D/I Motor Function - intact, moving foot and toes well on exam.  Hemovac pulled without difficulty.  Past Medical History  Diagnosis Date  . GERD (gastroesophageal reflux disease)   . Allergy   . Normal cardiac stress test     10-29-08  . Diverticulosis   . ED (erectile dysfunction)   . BPH (benign prostatic hypertrophy)   . Osteoarthritis   . Headache   . Personal history of colonic polyps   . Hypertension   . Other and unspecified hyperlipidemia   . Hypercalcemia       sees Dr Loanne Drilling  . Pulmonary embolism     01-10-11  . DVT of lower extremity (deep venous thrombosis)     right leg, 01-10-11  . Neuromuscular disorder     sciatica  . Hyperparathyroidism     Assessment/Plan: 1 Day Post-Op Procedure(s) (LRB): TOTAL KNEE ARTHROPLASTY (Left) Principal Problem:  *OA (osteoarthritis) of knee Active Problems:  Postop Hyponatremia  Posotp Acute blood loss anemia  Estimated Body mass index is 30.55 kg/(m^2) as calculated from the following:   Height as of this encounter: 5\' 10" (1.778 m).   Weight as of this encounter: 212 lb 14.4 oz(96.571 kg). Advance diet Up with therapy Discharge to SNF  DVT Prophylaxis - Xarelto, ASA 325 mg on hold Weight-Bearing as tolerated to left leg No vaccines. D/C O2 and Pulse OX and try on Room [address]  [name] 12/09/2012, 8:34 AM

## 2012-12-10 LAB — BASIC METABOLIC PANEL
BUN: 10 mg/dL (ref 6–23)
CO2: 28 mEq/L (ref 19–32)
Calcium: 10.1 mg/dL (ref 8.4–10.5)
GFR calc non Af Amer: 85 mL/min — ABNORMAL LOW (ref >90)
Glucose, Bld: 132 mg/dL — ABNORMAL HIGH (ref 70–99)
Potassium: 3.6 mEq/L (ref 3.5–5.1)

## 2012-12-10 LAB — CBC
HCT: 24.3 % — ABNORMAL LOW (ref 39.0–52.0)
Hemoglobin: 8.5 g/dL — ABNORMAL LOW (ref 13.0–17.0)
MCH: 33.9 pg (ref 26.0–34.0)
MCHC: 35 g/dL (ref 30.0–36.0)

## 2012-12-10 MED ORDER — POLYETHYLENE GLYCOL 3350 17 G PO PACK: 17.0000 g | PACK | Freq: Every day | ORAL | Status: DC

## 2012-12-10 MED ORDER — POLYSACCHARIDE IRON COMPLEX 150 MG PO CAPS
150.0000 mg | ORAL_CAPSULE | Freq: Two times a day (BID) | ORAL | Status: DC
  Administered 2012-12-10 – 2012-12-11 (×2): 150 mg via ORAL
  Filled 2012-12-10 (×3): qty 1

## 2012-12-10 NOTE — Progress Notes (Signed)
   Subjective: 2 Days Post-Op Procedure(s) (LRB): TOTAL KNEE ARTHROPLASTY (Left) Patient reports pain as mild.   Patient seen in rounds with Dr. Wynelle Link. Patient is well, and has had no acute complaints or problems Plan is to go Skilled nursing facility after hospital stay.  Objective: Vital signs in last 24 hours: Temp:  [98.6 F (37 C)-100 F (37.8 C)] 99.3 F (37.4 C) (01/15 2032) Pulse Rate:  [93-96] 93  (01/15 2032) Resp:  [16-18] 18  (01/15 2032) BP: (104-125)/(63-74) 125/74 mmHg (01/15 2032) SpO2:  [92 %-95 %] 92 % (01/15 2032)  Intake/Output from previous day:  Intake/Output Summary (Last 24 hours) at 12/10/12 2201 Last data filed at 12/10/12 2032  Gross per 24 hour  Intake 1654.5 ml  Output    550 ml  Net 1104.5 ml    Intake/Output this shift: Total I/O In: 120 [P.O.:120] Out: -   Labs:  Basename 12/10/12 0431 12/09/12 0430  HGB 8.5* 9.2*    Basename 12/10/12 0431 12/09/12 0430  WBC 8.5 6.9  RBC 2.51* 2.76*  HCT 24.3* 27.2*  PLT 164 167    Basename 12/10/12 0431 12/09/12 0430  NA 135 134*  K 3.6 3.5  CL 101 101  CO2 28 28  BUN 10 11  CREATININE 0.78 0.83  GLUCOSE 132* 131*  CALCIUM 10.1 9.8   No results found for this basename: LABPT:2,INR:2 in the last 72 hours  EXAM General - Patient is Alert, Appropriate and Oriented Extremity - Neurovascular intact Sensation intact distally Dorsiflexion/Plantar flexion intact No cellulitis present Dressing/Incision - clean, dry, no drainage, healing Motor Function - intact, moving foot and toes well on exam.   Past Medical History  Diagnosis Date  . GERD (gastroesophageal reflux disease)   . Allergy   . Normal cardiac stress test     10-29-08  . Diverticulosis   . ED (erectile dysfunction)   . BPH (benign prostatic hypertrophy)   . Osteoarthritis   . Headache   . Personal history of colonic polyps   . Hypertension   . Other and unspecified hyperlipidemia   . Hypercalcemia     sees Dr  Loanne Drilling  . Pulmonary embolism     01-10-11  . DVT of lower extremity (deep venous thrombosis)     right leg, 01-10-11  . Neuromuscular disorder     sciatica  . Hyperparathyroidism     [name] TOTAL KNEE ARTHROPLASTY (Left) Principal Problem:  *OA (osteoarthritis) of knee Active Problems:  Postop Hyponatremia  Posotp Acute blood loss anemia  Estimated Body mass index is 30.55 kg/(m^2) as calculated from the following:   Height as of this encounter: 5\' 10" (1.778 m).   Weight as of this encounter: 212 lb 14.4 oz(96.571 kg). [name], [name]  [name] 12/10/2012, 10:01 PM

## 2012-12-10 NOTE — Progress Notes (Signed)
Physical Therapy Treatment Patient Details Name: CAROLYN CHUANG MRN: [id number] DOB: [birth year] Today's Date: 12/10/2012 Time: [id number] PT Time Calculation (min): 38 min  PT Assessment / Plan / Recommendation Comments on Treatment Session  POD #2 L TKR am session.  Assisted pt OOB to amb in hallway then performed L LE TE's.  Pt progressing well and plans to D/C to SNF for ST Rehab.    Follow Up Recommendations  SNF     Does the patient have the potential to tolerate intense rehabilitation     Barriers to Discharge        Equipment Recommendations  None recommended by PT    Recommendations for Other Services    Frequency 7X/week   Plan Discharge plan remains appropriate    Precautions / Restrictions Precautions Precautions: Knee Precaution Comments: Pt instructed on KI use and when do D/C Required Braces or Orthoses: Knee Immobilizer - Left Knee Immobilizer - Left: Discontinue once straight leg raise with < 10 degree lag Restrictions Weight Bearing Restrictions: No Other Position/Activity Restrictions: WBAT   Pertinent Vitals/Pain C/o 3/10 L knee pain during gait Pre medicated ICE applied    Mobility  Bed Mobility Bed Mobility: Supine to Sit Supine to Sit: 4: Min guard Details for Bed Mobility Assistance: MinGuard assist to support L LE off bed Transfers Transfers: Sit to Stand;Stand to Sit Sit to Stand: 4: Min guard;From bed Stand to Sit: 4: Min guard;To chair/3-in-1 Details for Transfer Assistance: 25% VC's on safety with turn completion prior to sit and hand placement to control decend Ambulation/Gait Ambulation/Gait Assistance: 4: Min guard;4: Min Wellsite geologist (Feet): 50 Feet Assistive device: Rolling walker Ambulation/Gait Assistance Details: 50% VC's on proper sequencing and upright posture. Gait Pattern: Step-to pattern;Decreased stance time - left;Trunk flexed Gait velocity: decreased    Exercises Total Joint Exercises Ankle  Circles/Pumps: AROM;Both;10 reps;Supine Quad Sets: AROM;Both;10 reps;Supine Gluteal Sets: AROM;Both;10 reps;Supine Towel Squeeze: AROM;Both;10 reps;Supine Heel Slides: AAROM;Left;10 reps;Supine Hip ABduction/ADduction: AAROM;Left;10 reps;Supine Straight Leg Raises: AAROM;Left;10 reps;Supine   PT Goals                                                          progressing    Visit Information  Last PT Received On: 12/10/12 Assistance Needed: +1    Subjective Data      Cognition       Balance   fair  End of Session PT - End of Session Equipment Utilized During Treatment: Gait belt;Left knee immobilizer Activity Tolerance: Patient tolerated treatment well Patient left: in chair;with call bell/phone within reach (ICE to L knee)   Rica Koyanagi  PTA [facility]  Acute  Rehab Pager     [phone]

## 2012-12-10 NOTE — Progress Notes (Signed)
Physical Therapy Treatment Patient Details Name: [name] MRN: [id number] DOB: Jul 07, 1933 Today's Date: 12/10/2012 Time: [id number] PT Time Calculation (min): 26 min  PT Assessment / Plan / Recommendation Comments on Treatment Session  POD #2 L TKR pm session.  Amb in hallway second time.  Pt progressing well and plans to D/C to SNF for ST Rehab.    Follow Up Recommendations  SNF     Does the patient have the potential to tolerate intense rehabilitation     Barriers to Discharge        Equipment Recommendations  None recommended by PT    Recommendations for Other Services    Frequency 7X/week   Plan Discharge plan remains appropriate    Precautions / Restrictions Precautions Precautions: Knee Precaution Comments: Pt instructed on KI use and when do D/C Required Braces or Orthoses: Knee Immobilizer - Left Knee Immobilizer - Left: Discontinue once straight leg raise with < 10 degree lag Restrictions Weight Bearing Restrictions: No Other Position/Activity Restrictions: WBAT   Pertinent Vitals/Pain C/o 7/10 L knee pain Applied ICE    Mobility  Bed Mobility Bed Mobility: Not assessed Supine to Sit: 4: Min guard Details for Bed Mobility Assistance: pt OOB in recliner Transfers Transfers: Sit to Stand;Stand to Sit Sit to Stand: 4: Min guard;From chair/3-in-1 Stand to Sit: 4: Min guard;To chair/3-in-1 Details for Transfer Assistance: 25% VC's on safety using RW throughout the turn then reaching back for recliner. Ambulation/Gait Ambulation/Gait Assistance: 4: Min guard Ambulation Distance (Feet): 73 Feet Assistive device: Rolling walker Ambulation/Gait Assistance Details: 25% VC's on proper sequencing and proper walker to self distance. Gait Pattern: Step-to pattern;Decreased stance time - left;Trunk flexed Gait velocity: decreased         PT Goals                                                progressing    Visit Information  Last PT Received On:  12/10/12 Assistance Needed: +1    Subjective Data      Cognition       Balance     End of Session PT - End of Session Equipment Utilized During Treatment: Gait belt;Left knee immobilizer Activity Tolerance: Patient tolerated treatment well Patient left: in chair;with call bell/phone within reach (ICE to L knee)   GP     [name] 12/10/2012, 12:52 PM

## 2012-12-11 DIAGNOSIS — M25569 Pain in unspecified knee: Secondary | ICD-10-CM | POA: Diagnosis not present

## 2012-12-11 DIAGNOSIS — E213 Hyperparathyroidism, unspecified: Secondary | ICD-10-CM | POA: Diagnosis not present

## 2012-12-11 DIAGNOSIS — D649 Anemia, unspecified: Secondary | ICD-10-CM | POA: Diagnosis not present

## 2012-12-11 DIAGNOSIS — S8990XA Unspecified injury of unspecified lower leg, initial encounter: Secondary | ICD-10-CM | POA: Diagnosis not present

## 2012-12-11 DIAGNOSIS — Z5189 Encounter for other specified aftercare: Secondary | ICD-10-CM | POA: Diagnosis not present

## 2012-12-11 DIAGNOSIS — E785 Hyperlipidemia, unspecified: Secondary | ICD-10-CM | POA: Diagnosis not present

## 2012-12-11 DIAGNOSIS — R51 Headache: Secondary | ICD-10-CM | POA: Diagnosis not present

## 2012-12-11 DIAGNOSIS — K219 Gastro-esophageal reflux disease without esophagitis: Secondary | ICD-10-CM | POA: Diagnosis not present

## 2012-12-11 DIAGNOSIS — Z96659 Presence of unspecified artificial knee joint: Secondary | ICD-10-CM | POA: Diagnosis not present

## 2012-12-11 DIAGNOSIS — M5430 Sciatica, unspecified side: Secondary | ICD-10-CM | POA: Diagnosis not present

## 2012-12-11 DIAGNOSIS — M7989 Other specified soft tissue disorders: Secondary | ICD-10-CM | POA: Diagnosis not present

## 2012-12-11 DIAGNOSIS — K5730 Diverticulosis of large intestine without perforation or abscess without bleeding: Secondary | ICD-10-CM | POA: Diagnosis not present

## 2012-12-11 DIAGNOSIS — I10 Essential (primary) hypertension: Secondary | ICD-10-CM | POA: Diagnosis not present

## 2012-12-11 DIAGNOSIS — N400 Enlarged prostate without lower urinary tract symptoms: Secondary | ICD-10-CM | POA: Diagnosis not present

## 2012-12-11 DIAGNOSIS — I82409 Acute embolism and thrombosis of unspecified deep veins of unspecified lower extremity: Secondary | ICD-10-CM | POA: Diagnosis not present

## 2012-12-11 DIAGNOSIS — M1710 Unilateral primary osteoarthritis, unspecified knee: Secondary | ICD-10-CM | POA: Diagnosis not present

## 2012-12-11 DIAGNOSIS — Z471 Aftercare following joint replacement surgery: Secondary | ICD-10-CM | POA: Diagnosis not present

## 2012-12-11 LAB — BASIC METABOLIC PANEL
CO2: 29 mEq/L (ref 19–32)
Calcium: 10 mg/dL (ref 8.4–10.5)
Creatinine, Ser: 0.88 mg/dL (ref 0.50–1.35)
GFR calc non Af Amer: 81 mL/min — ABNORMAL LOW (ref >90)
Glucose, Bld: 103 mg/dL — ABNORMAL HIGH (ref 70–99)
Sodium: 135 mEq/L (ref 135–145)

## 2012-12-11 LAB — CBC
Hemoglobin: 7 g/dL — ABNORMAL LOW (ref 13.0–17.0)
MCH: 33.8 pg (ref 26.0–34.0)
MCHC: 34.8 g/dL (ref 30.0–36.0)
MCV: 97.1 fL (ref 78.0–100.0)

## 2012-12-11 MED ORDER — DSS 100 MG PO CAPS: 100.0000 mg | ORAL_CAPSULE | Freq: Two times a day (BID) | ORAL | Status: DC

## 2012-12-11 MED ORDER — ACETAMINOPHEN 325 MG PO TABS
650.0000 mg | ORAL_TABLET | Freq: Once | ORAL | Status: AC
  Administered 2012-12-11: 650 mg via ORAL

## 2012-12-11 MED ORDER — METHOCARBAMOL 500 MG PO TABS: 500.0000 mg | ORAL_TABLET | Freq: Four times a day (QID) | ORAL | Status: DC | PRN

## 2012-12-11 MED ORDER — POTASSIUM CHLORIDE CRYS ER 20 MEQ PO TBCR
40.0000 meq | EXTENDED_RELEASE_TABLET | ORAL | Status: AC
  Administered 2012-12-11 (×2): 40 meq via ORAL
  Filled 2012-12-11 (×2): qty 2

## 2012-12-11 MED ORDER — ONDANSETRON HCL 4 MG PO TABS: 4.0000 mg | ORAL_TABLET | Freq: Four times a day (QID) | ORAL | Status: DC | PRN

## 2012-12-11 MED ORDER — OXYCODONE HCL 5 MG PO TABS: 5.0000 mg | ORAL_TABLET | ORAL | Status: DC | PRN

## 2012-12-11 MED ORDER — SODIUM CHLORIDE 0.9 % IV SOLN
INTRAVENOUS | Status: DC
  Administered 2012-12-11: 30 mL/h via INTRAVENOUS

## 2012-12-11 MED ORDER — ACETAMINOPHEN 325 MG PO TABS: 650.0000 mg | ORAL_TABLET | Freq: Four times a day (QID) | ORAL | Status: DC | PRN

## 2012-12-11 MED ORDER — RIVAROXABAN 10 MG PO TABS: 10.0000 mg | ORAL_TABLET | Freq: Every day | ORAL | Status: DC

## 2012-12-11 MED ORDER — BISACODYL 10 MG RE SUPP: 10.0000 mg | Freq: Every day | RECTAL | Status: DC | PRN

## 2012-12-11 MED ORDER — POLYSACCHARIDE IRON COMPLEX 150 MG PO CAPS: 150.0000 mg | ORAL_CAPSULE | Freq: Two times a day (BID) | ORAL | Status: DC

## 2012-12-11 NOTE — Progress Notes (Signed)
Physical Therapy Treatment Patient Details Name: [name] MRN: [id number] DOB: August 09, 1933 Today's Date: 12/11/2012 Time: 1344-1410 PT Time Calculation (min): 26 min  PT Assessment / Plan / Recommendation Comments on Treatment Session  POD #3 L TKR.  Pt not seen this am due to blood transfusion. Amb pt in hallway then assisted with donning clothing and instructing pt and spouse on proper application of [place].  Pt  D/C to [facility] SNF today for ST Rehab.    Follow Up Recommendations  SNF     Does the patient have the potential to tolerate intense rehabilitation     Barriers to Discharge        Equipment Recommendations  None recommended by PT    Recommendations for Other Services    Frequency 7X/week   Plan Discharge plan remains appropriate    Precautions / Restrictions Precautions Precautions: Knee Precaution Comments: Pt instructed on KI use and when do D/C Required Braces or Orthoses: Knee Immobilizer - Left Restrictions Weight Bearing Restrictions: No Other Position/Activity Restrictions: WBAT   Pertinent Vitals/Pain C/o "soreness"    Mobility  Bed Mobility Bed Mobility: Not assessed Details for Bed Mobility Assistance: Pt OOB in bathroom on arrival Transfers Transfers: Sit to Stand;Stand to Sit Sit to Stand: 4: Min guard Stand to Sit: 4: Min guard Details for Transfer Assistance: 25% VC's on safety using RW throughout the turn then reaching back for recliner Ambulation/Gait Ambulation/Gait Assistance: 4: Min guard Ambulation Distance (Feet): 86 Feet Assistive device: Rolling walker Ambulation/Gait Assistance Details: 25% VC's on proper sequencing and proper walker to self distance.  Gait Pattern: Step-to pattern;Decreased stance time - left Gait velocity: decreased     PT Goals                                                  progressing    Visit Information  Last PT Received On: 12/11/12 Assistance Needed: +1    Subjective Data        Cognition       Balance     End of Session PT - End of Session Equipment Utilized During Treatment: Gait belt;Left knee immobilizer Activity Tolerance: Patient tolerated treatment well Patient left: Other (comment) (EMS stretcher)   Rica Koyanagi  PTA [facility]  Acute  Rehab Pager     [phone]

## 2012-12-11 NOTE — Progress Notes (Signed)
Reported called to [facility] and given to Margarette Canada, Therapist, sports. PTAR on unit at present preparing pt for transfer to SNF for rehab at [facility]. Assessment unchanged. VSS. Pt discharged via stretcher accompanied by PTAR staff X2 to meet awaiting ambulance. Wife with pt.

## 2012-12-11 NOTE — Progress Notes (Signed)
   Subjective: 3 Days Post-Op Procedure(s) (LRB): TOTAL KNEE ARTHROPLASTY (Left) Patient reports pain as mild.   Patient seen in rounds with Dr. Wynelle Link. HGB lower today down to 7.0. Will give one unit and then transfer to SNF. Patient is well, and has had no acute complaints or problems Patient is ready to go to the SNF after the one unit of blood.  Objective: Vital signs in last 24 hours: Temp:  [98.6 F (37 C)-99.3 F (37.4 C)] 99.3 F (37.4 C) (01/16 0615) Pulse Rate:  [93-98] 98  (01/16 0615) Resp:  [16-18] 18  (01/16 0800) BP: (104-125)/(63-74) 120/67 mmHg (01/16 0615) SpO2:  [90 %-94 %] 90 % (01/16 0615)  Intake/Output from previous day:  Intake/Output Summary (Last 24 hours) at 12/11/12 0900 Last data filed at 12/11/12 0859  Gross per 24 hour  Intake   1440 ml  Output    825 ml  Net    615 ml    Intake/Output this shift: Total I/O In: 240 [P.O.:240] Out: 100 [Urine:100]  Labs:  Basename 12/11/12 0415 12/10/12 0431 12/09/12 0430  HGB 7.0* 8.5* 9.2*    Basename 12/11/12 0415 12/10/12 0431  WBC 7.3 8.5  RBC 2.07* 2.51*  HCT 20.1* 24.3*  PLT 156 164    Basename 12/11/12 0415 12/10/12 0431  NA 135 135  K 3.3* 3.6  CL 101 101  CO2 29 28  BUN 15 10  CREATININE 0.88 0.78  GLUCOSE 103* 132*  CALCIUM 10.0 10.1   No results found for this basename: LABPT:2,INR:2 in the last 72 hours  EXAM: General - Patient is Alert, Appropriate and Oriented Extremity - Neurovascular intact Sensation intact distally Dorsiflexion/Plantar flexion intact No cellulitis present Incision - clean, dry, no drainage, healing Motor Function - intact, moving foot and toes well on exam.   Assessment/Plan: 3 Days Post-Op Procedure(s) (LRB): TOTAL KNEE ARTHROPLASTY (Left) Procedure(s) (LRB): TOTAL KNEE ARTHROPLASTY (Left) Past Medical History  Diagnosis Date  . GERD (gastroesophageal reflux disease)   . Allergy   . Normal cardiac stress test     10-29-08  . Diverticulosis    . ED (erectile dysfunction)   . BPH (benign prostatic hypertrophy)   . Osteoarthritis   . Headache   . Personal history of colonic polyps   . Hypertension   . Other and unspecified hyperlipidemia   . Hypercalcemia     sees Dr Loanne Drilling  . Pulmonary embolism     01-10-11  . DVT of lower extremity (deep venous thrombosis)     right leg, 01-10-11  . Neuromuscular disorder     sciatica  . Hyperparathyroidism    Principal Problem:  *OA (osteoarthritis) of knee Active Problems:  Postop Hyponatremia  Posotp Acute blood loss anemia  Postop Hypokalemia  Postop Transfusion  Estimated Body mass index is 30.55 kg/(m^2) as calculated from the following:   Height as of this encounter: 5\' 10" (1.778 m).   Weight as of this encounter: 212 lb 14.4 oz(96.571 kg). Up with therapy Discharge to SNF Diet - Cardiac diet and Diabetic diet Follow up - in 2 weeks on Tuesday the 28th Activity - WBAT Disposition - Skilled nursing facility Condition Upon Discharge - Good D/C Meds - See DC Summary DVT Prophylaxis - Xarelto, ASA 325 mg on hold   PERKINS, ALEXZANDREW 12/11/2012, 9:00 AM

## 2012-12-11 NOTE — Discharge Summary (Signed)
Physician Discharge Summary   Patient ID: [name] MRN: [id number] DOB/AGE: 07-08-1933 79 y.o.  Admit date: 12/08/2012 Discharge date: 12/11/2012  Primary Diagnosis: Osteoarthritis Left knee  Admission Diagnoses:  Past Medical History  Diagnosis Date  . GERD (gastroesophageal reflux disease)   . Allergy   . Normal cardiac stress test     10-29-08  . Diverticulosis   . ED (erectile dysfunction)   . BPH (benign prostatic hypertrophy)   . Osteoarthritis   . Headache   . Personal history of colonic polyps   . Hypertension   . Other and unspecified hyperlipidemia   . Hypercalcemia     sees Dr Loanne Drilling  . Pulmonary embolism     01-10-11  . DVT of lower extremity (deep venous thrombosis)     right leg, 01-10-11  . Neuromuscular disorder     sciatica  . Hyperparathyroidism    Discharge Diagnoses:   Principal Problem:  *OA (osteoarthritis) of knee Active Problems:  Postop Hyponatremia  Posotp Acute blood loss anemia  Postop Hypokalemia  Postop Transfusion  Estimated Body mass index is 30.55 kg/(m^2) as calculated from the following:   Height as of this encounter: 5\' 10" (1.778 m).   Weight as of this encounter: 212 lb 14.4 oz(96.571 kg).  Classification of overweight in adults according to BMI (WHO, 1998)   Procedure:  Procedure(s) (LRB): TOTAL KNEE ARTHROPLASTY (Left)   Consults: None  HPI: [name] is a 79 y.o. year old male with end stage OA of his left knee with progressively worsening pain and dysfunction. He has constant pain, with activity and at rest and significant functional deficits with difficulties even with ADLs. He has had extensive non-op management including analgesics, injections of cortisone and viscosupplements, and home exercise program, but remains in significant pain with significant dysfunction. Radiographs show bone on bone arthritis laeral and patellofemoral without valgus deformity. He presents now for left Total Knee  Arthroplasty.   Laboratory Data: Admission on 12/08/2012  Component Date Value Range Status  . ABO/RH(D) 12/08/2012 O POS   Final  . Antibody Screen 12/08/2012 NEG   Final  . Sample Expiration 12/08/2012 12/11/2012   Final  . Unit Number 12/08/2012 [id number]   Final  . Blood Component Type 12/08/2012 RED CELLS,LR   Final  . Unit division 12/08/2012 00   Final  . Status of Unit 12/08/2012 ALLOCATED   Final  . Transfusion Status 12/08/2012 OK TO TRANSFUSE   Final  . Crossmatch Result 12/08/2012 Compatible   Final  . ABO/RH(D) 12/08/2012 O POS   Final  . WBC 12/09/2012 6.9  4.0 - 10.5 K/uL Final  . RBC 12/09/2012 2.76* 4.22 - 5.81 MIL/uL Final  . Hemoglobin 12/09/2012 9.2* 13.0 - 17.0 g/dL Final  . HCT 12/09/2012 27.2* 39.0 - 52.0 % Final  . MCV 12/09/2012 98.6  78.0 - 100.0 fL Final  . MCH 12/09/2012 33.3  26.0 - 34.0 pg Final  . MCHC 12/09/2012 33.8  30.0 - 36.0 g/dL Final  . RDW 12/09/2012 13.3  11.5 - 15.5 % Final  . Platelets 12/09/2012 167  150 - 400 K/uL Final  . Sodium 12/09/2012 134* 135 - 145 mEq/L Final  . Potassium 12/09/2012 3.5  3.5 - 5.1 mEq/L Final  . Chloride 12/09/2012 101  96 - 112 mEq/L Final  . CO2 12/09/2012 28  19 - 32 mEq/L Final  . Glucose, Bld 12/09/2012 131* 70 - 99 mg/dL Final  . BUN 12/09/2012 11  6 - 23  mg/dL Final  . Creatinine, Ser 12/09/2012 0.83  0.50 - 1.35 mg/dL Final  . Calcium 12/09/2012 9.8  8.4 - 10.5 mg/dL Final  . GFR calc non Af Amer 12/09/2012 83* >90 mL/min Final  . GFR calc Af Amer 12/09/2012 >90  >90 mL/min Final   Comment:                                 The eGFR has been calculated                          using the CKD EPI equation.                          This calculation has not been                          validated in all clinical                          situations.                          eGFR's persistently                          <90 mL/min signify                          possible Chronic Kidney Disease.  . WBC  12/10/2012 8.5  4.0 - 10.5 K/uL Final  . RBC 12/10/2012 2.51* 4.22 - 5.81 MIL/uL Final  . Hemoglobin 12/10/2012 8.5* 13.0 - 17.0 g/dL Final  . HCT 12/10/2012 24.3* 39.0 - 52.0 % Final  . MCV 12/10/2012 96.8  78.0 - 100.0 fL Final  . MCH 12/10/2012 33.9  26.0 - 34.0 pg Final  . MCHC 12/10/2012 35.0  30.0 - 36.0 g/dL Final  . RDW 12/10/2012 13.0  11.5 - 15.5 % Final  . Platelets 12/10/2012 164  150 - 400 K/uL Final  . Sodium 12/10/2012 135  135 - 145 mEq/L Final  . Potassium 12/10/2012 3.6  3.5 - 5.1 mEq/L Final  . Chloride 12/10/2012 101  96 - 112 mEq/L Final  . CO2 12/10/2012 28  19 - 32 mEq/L Final  . Glucose, Bld 12/10/2012 132* 70 - 99 mg/dL Final  . BUN 12/10/2012 10  6 - 23 mg/dL Final  . Creatinine, Ser 12/10/2012 0.78  0.50 - 1.35 mg/dL Final  . Calcium 12/10/2012 10.1  8.4 - 10.5 mg/dL Final  . GFR calc non Af Amer 12/10/2012 85* >90 mL/min Final  . GFR calc Af Amer 12/10/2012 >90  >90 mL/min Final   Comment:                                 The eGFR has been calculated                          using the CKD EPI equation.                          This calculation has not been  validated in all clinical                          situations.                          eGFR's persistently                          <90 mL/min signify                          possible Chronic Kidney Disease.  . WBC 12/11/2012 7.3  4.0 - 10.5 K/uL Final  . RBC 12/11/2012 2.07* 4.22 - 5.81 MIL/uL Final  . Hemoglobin 12/11/2012 7.0* 13.0 - 17.0 g/dL Final   Comment: REPEATED TO VERIFY                          DELTA CHECK NOTED  . HCT 12/11/2012 20.1* 39.0 - 52.0 % Final  . MCV 12/11/2012 97.1  78.0 - 100.0 fL Final  . MCH 12/11/2012 33.8  26.0 - 34.0 pg Final  . MCHC 12/11/2012 34.8  30.0 - 36.0 g/dL Final  . RDW 12/11/2012 13.0  11.5 - 15.5 % Final  . Platelets 12/11/2012 156  150 - 400 K/uL Final  . Sodium 12/11/2012 135  135 - 145 mEq/L Final  . Potassium 12/11/2012 3.3*  3.5 - 5.1 mEq/L Final  . Chloride 12/11/2012 101  96 - 112 mEq/L Final  . CO2 12/11/2012 29  19 - 32 mEq/L Final  . Glucose, Bld 12/11/2012 103* 70 - 99 mg/dL Final  . BUN 12/11/2012 15  6 - 23 mg/dL Final  . Creatinine, Ser 12/11/2012 0.88  0.50 - 1.35 mg/dL Final  . Calcium 12/11/2012 10.0  8.4 - 10.5 mg/dL Final  . GFR calc non Af Amer 12/11/2012 81* >90 mL/min Final  . GFR calc Af Amer 12/11/2012 >90  >90 mL/min Final   Comment:                                 The eGFR has been calculated                          using the CKD EPI equation.                          This calculation has not been                          validated in all clinical                          situations.                          eGFR's persistently                          <90 mL/min signify                          possible Chronic Kidney Disease.  . Order Confirmation  12/11/2012 ORDER PROCESSED BY BLOOD BANK   Final  Hospital Outpatient Visit on 11/28/2012  Component Date Value Range Status  . aPTT 11/28/2012 28  24 - 37 seconds Final  . WBC 11/28/2012 6.5  4.0 - 10.5 K/uL Final  . RBC 11/28/2012 3.92* 4.22 - 5.81 MIL/uL Final  . Hemoglobin 11/28/2012 13.2  13.0 - 17.0 g/dL Final  . HCT 11/28/2012 37.7* 39.0 - 52.0 % Final  . MCV 11/28/2012 96.2  78.0 - 100.0 fL Final  . MCH 11/28/2012 33.7  26.0 - 34.0 pg Final  . MCHC 11/28/2012 35.0  30.0 - 36.0 g/dL Final  . RDW 11/28/2012 13.1  11.5 - 15.5 % Final  . Platelets 11/28/2012 237  150 - 400 K/uL Final  . Sodium 11/28/2012 139  135 - 145 mEq/L Final  . Potassium 11/28/2012 4.0  3.5 - 5.1 mEq/L Final  . Chloride 11/28/2012 100  96 - 112 mEq/L Final  . CO2 11/28/2012 29  19 - 32 mEq/L Final  . Glucose, Bld 11/28/2012 97  70 - 99 mg/dL Final  . BUN 11/28/2012 18  6 - 23 mg/dL Final  . Creatinine, Ser 11/28/2012 1.08  0.50 - 1.35 mg/dL Final  . Calcium 11/28/2012 11.5* 8.4 - 10.5 mg/dL Final  . Total Protein 11/28/2012 7.2  6.0 - 8.3 g/dL Final  .  Albumin 11/28/2012 4.2  3.5 - 5.2 g/dL Final  . AST 11/28/2012 21  0 - 37 U/L Final  . ALT 11/28/2012 16  0 - 53 U/L Final  . Alkaline Phosphatase 11/28/2012 56  39 - 117 U/L Final  . Total Bilirubin 11/28/2012 0.8  0.3 - 1.2 mg/dL Final  . GFR calc non Af Amer 11/28/2012 64* >90 mL/min Final  . GFR calc Af Amer 11/28/2012 74* >90 mL/min Final   Comment:                                 The eGFR has been calculated                          using the CKD EPI equation.                          This calculation has not been                          validated in all clinical                          situations.                          eGFR's persistently                          <90 mL/min signify                          possible Chronic Kidney Disease.  Marland Kitchen Prothrombin Time 11/28/2012 13.4  11.6 - 15.2 seconds Final  . INR 11/28/2012 1.03  0.00 - 1.49 Final  . Color, Urine 11/28/2012 YELLOW  YELLOW Final  . APPearance 11/28/2012 CLEAR  CLEAR Final  . Specific Gravity, Urine 11/28/2012 1.011  1.005 - 1.030  Final  . pH 11/28/2012 6.0  5.0 - 8.0 Final  . Glucose, UA 11/28/2012 NEGATIVE  NEGATIVE mg/dL Final  . Hgb urine dipstick 11/28/2012 NEGATIVE  NEGATIVE Final  . Bilirubin Urine 11/28/2012 NEGATIVE  NEGATIVE Final  . Ketones, ur 11/28/2012 NEGATIVE  NEGATIVE mg/dL Final  . Protein, ur 11/28/2012 NEGATIVE  NEGATIVE mg/dL Final  . Urobilinogen, UA 11/28/2012 0.2  0.0 - 1.0 mg/dL Final  . Nitrite 11/28/2012 NEGATIVE  NEGATIVE Final  . Leukocytes, UA 11/28/2012 NEGATIVE  NEGATIVE Final   MICROSCOPIC NOT DONE ON URINES WITH NEGATIVE PROTEIN, BLOOD, LEUKOCYTES, NITRITE, OR GLUCOSE <1000 mg/dL.  Marland Kitchen MRSA, PCR 11/28/2012 NEGATIVE  NEGATIVE Final  . Staphylococcus aureus 11/28/2012 NEGATIVE  NEGATIVE Final   Comment:                                 The Xpert SA Assay (FDA                          approved for NASAL specimens                          in patients over 73 years of age),                           is one component of                          a comprehensive surveillance                          program.  Test performance has                          been validated by [employer] for patients greater                          than or equal to 70 year old.                          It is not intended                          to diagnose infection nor to                          guide or monitor treatment.  Office Visit on 11/05/2012  Component Date Value Range Status  . Cholesterol 11/05/2012 173  0 - 200 mg/dL Final   ATP III Classification       Desirable:  < 200 mg/dL               Borderline High:  200 - 239 mg/dL          High:  > = 240 mg/dL  . Triglycerides 11/05/2012 29.0  0.0 - 149.0 mg/dL Final   Normal:  <150 mg/dLBorderline High:  150 - 199 mg/dL  . HDL 11/05/2012 74.70  >39.00 mg/dL Final  . VLDL  11/05/2012 5.8  0.0 - 40.0 mg/dL Final  . LDL Cholesterol 11/05/2012 93  0 - 99 mg/dL Final  . Total CHOL/HDL Ratio 11/05/2012 2   Final                  Men          Women1/2 Average Risk     3.4          3.3Average Risk          5.0          4.42X Average Risk          9.6          7.13X Average Risk          15.0          11.0                      . Sodium 11/05/2012 139  135 - 145 mEq/L Final  . Potassium 11/05/2012 4.1  3.5 - 5.1 mEq/L Final  . Chloride 11/05/2012 100  96 - 112 mEq/L Final  . CO2 11/05/2012 29  19 - 32 mEq/L Final  . Glucose, Bld 11/05/2012 99  70 - 99 mg/dL Final  . BUN 11/05/2012 21  6 - 23 mg/dL Final  . Creatinine, Ser 11/05/2012 0.9  0.4 - 1.5 mg/dL Final  . Calcium 11/05/2012 11.1* 8.4 - 10.5 mg/dL Final  . GFR 11/05/2012 83.74  >60.00 mL/min Final  . Total Bilirubin 11/05/2012 1.3* 0.3 - 1.2 mg/dL Final  . Bilirubin, Direct 11/05/2012 0.2  0.0 - 0.3 mg/dL Final  . Alkaline Phosphatase 11/05/2012 55  39 - 117 U/L Final  . AST 11/05/2012 22  0 - 37 U/L Final  . ALT 11/05/2012 18  0 - 53 U/L Final  . Total  Protein 11/05/2012 7.3  6.0 - 8.3 g/dL Final  . Albumin 11/05/2012 4.3  3.5 - 5.2 g/dL Final  . Color, UA 11/05/2012 brown   Final  . Clarity, UA 11/05/2012 clear   Final  . Glucose, UA 11/05/2012 n   Final  . Bilirubin, UA 11/05/2012 1+   Final  . Ketones, UA 11/05/2012 1+   Final  . Spec Grav, UA 11/05/2012 1.020   Final  . Blood, UA 11/05/2012 n   Final  . pH, UA 11/05/2012 7.0   Final  . Protein, UA 11/05/2012 trace   Final  . Urobilinogen, UA 11/05/2012 1.0   Final  . Nitrite, UA 11/05/2012 n   Final  . Leukocytes, UA 11/05/2012 Negative   Final  . TSH 11/05/2012 1.88  0.35 - 5.50 uIU/mL Final  . WBC 11/05/2012 6.3  4.5 - 10.5 K/uL Final  . RBC 11/05/2012 3.96* 4.22 - 5.81 Mil/uL Final  . Hemoglobin 11/05/2012 13.4  13.0 - 17.0 g/dL Final  . HCT 11/05/2012 39.1  39.0 - 52.0 % Final  . MCV 11/05/2012 98.9  78.0 - 100.0 fl Final  . MCHC 11/05/2012 34.3  30.0 - 36.0 g/dL Final  . RDW 11/05/2012 14.4  11.5 - 14.6 % Final  . Platelets 11/05/2012 239.0  150.0 - 400.0 K/uL Final  . Neutrophils Relative 11/05/2012 67.8  43.0 - 77.0 % Final  . Lymphocytes Relative 11/05/2012 17.7  12.0 - 46.0 % Final  . Monocytes Relative 11/05/2012 12.7* 3.0 - 12.0 % Final  . Eosinophils Relative 11/05/2012 1.1  0.0 - 5.0 % Final  . Basophils Relative 11/05/2012 0.7  0.0 - 3.0 % Final  . Neutro Abs 11/05/2012 4.3  1.4 - 7.7 K/uL Final  . Lymphs Abs 11/05/2012 1.1  0.7 - 4.0 K/uL Final  . Monocytes Absolute 11/05/2012 0.8  0.1 - 1.0 K/uL Final  . Eosinophils Absolute 11/05/2012 0.1  0.0 - 0.7 K/uL Final  . Basophils Absolute 11/05/2012 0.0  0.0 - 0.1 K/uL Final     X-Rays:Dg Chest 2 View  11/28/2012  *RADIOLOGY REPORT*  Clinical Data: Preoperative evaluation.  CHEST - 2 VIEW  Comparison: 01/11/2010.  Findings: There is borderline enlargement of the cardiac silhouette.  There is ectasia of thoracic aorta with mild descending aortic tortuosity.  Mediastinal and hilar contours appear stable.  No pulmonary  infiltrates or masses were evident. No pleural abnormality is evident.  Changes of degenerative disc disease and degenerative spondylosis are present.  Minimal compression fracture of mid thoracic vertebral body is unchanged. Slightly osteopenic appearance of bones.  IMPRESSION: Borderline cardiac silhouette enlargement.  Ectasia of thoracic aorta.  No acute cardiopulmonary or pleural abnormalities are evident.  Chronic bony findings are detailed above.   Original Report Authenticated By: Shanon Brow Call     EKG: Orders placed in visit on 11/05/12  . EKG 12-LEAD     Hospital Course: [name] is a 79 y.o. who was admitted to [facility]. They were brought to the operating room on 12/08/2012 and underwent Procedure(s): TOTAL KNEE ARTHROPLASTY.  Patient tolerated the procedure well and was later transferred to the recovery room and then to the orthopaedic floor for postoperative care.  They were given PO and IV analgesics for pain control following their surgery.  They were given 24 hours of postoperative antibiotics of  Anti-infectives     Start     Dose/Rate Route Frequency Ordered Stop   12/08/12 1900   ceFAZolin (ANCEF) IVPB 2 g/50 mL premix        2 g 100 mL/hr over 30 Minutes Intravenous Every 6 hours 12/08/12 1615 12/09/12 0108   12/08/12 1202   ceFAZolin (ANCEF) IVPB 2 g/50 mL premix        2 g 100 mL/hr over 30 Minutes Intravenous 60 min pre-op 12/08/12 1202 12/08/12 1322         and started on DVT prophylaxis in the form of Xarelto.   PT and OT were ordered for total joint protocol.  Discharge planning consulted to help with postop disposition and equipment needs.  Patient had a good night on the evening of surgery and started to get up OOB with therapy on day one. Hemovac drain was pulled without difficulty.  Continued to work with therapy into day two walking over 50 feet.  Dressing was changed on day two and the incision was healing well.  By day three, the patient had  progressed with therapy and meeting their goals. The HGB was lower to 7.0 so he received on unit of blood before transfer. Incision was healing well.  Patient was seen in rounds and was ready to go tot he SNF - [facility] [place].   Discharge Medications: Prior to Admission medications   Medication Sig Start Date End Date Taking? Authorizing Provider  polyethylene glycol powder (GLYCOLAX/MIRALAX) powder Take 17 g by mouth daily.    Yes Historical Provider, MD  Psyllium (METAMUCIL) 30.9 % POWD Take 15 mLs by mouth daily.    Yes Historical Provider, MD  acetaminophen (TYLENOL) 325 MG tablet Take 2 tablets (650 mg total) by mouth every 6 (six)  hours as needed (or Fever >/= 101). 12/11/12   Alexzandrew Dara Lords, PA  atorvastatin (LIPITOR) 10 MG tablet Take 10 mg by mouth daily before breakfast.    Historical Provider, MD  bisacodyl (DULCOLAX) 10 MG suppository Place 1 suppository (10 mg total) rectally daily as needed. 12/11/12   Alexzandrew Perkins, PA  docusate sodium 100 MG CAPS Take 100 mg by mouth 2 (two) times daily. 12/11/12   Alexzandrew Dara Lords, PA  doxazosin (CARDURA) 4 MG tablet Take 4 mg by mouth at bedtime.    Historical Provider, MD  dutasteride (AVODART) 0.5 MG capsule Take 0.5 mg by mouth every other day.    Historical Provider, MD  fluticasone (FLONASE) 50 MCG/ACT nasal spray Place 2 sprays into the nose daily.    Historical Provider, MD  furosemide (LASIX) 40 MG tablet Take 40 mg by mouth daily before breakfast.    Historical Provider, MD  iron polysaccharides (NIFEREX) 150 MG capsule Take 1 capsule (150 mg total) by mouth 2 (two) times daily. 12/11/12   Alexzandrew Dara Lords, PA  methocarbamol (ROBAXIN) 500 MG tablet Take 1 tablet (500 mg total) by mouth every 6 (six) hours as needed. 12/11/12   Alexzandrew Perkins, PA  ondansetron (ZOFRAN) 4 MG tablet Take 1 tablet (4 mg total) by mouth every 6 (six) hours as needed for nausea. 12/11/12   Alexzandrew Perkins, PA  oxyCODONE (OXY  IR/ROXICODONE) 5 MG immediate release tablet Take 1-2 tablets (5-10 mg total) by mouth every 3 (three) hours as needed. 12/11/12   Alexzandrew Dara Lords, PA  pantoprazole (PROTONIX) 40 MG tablet Take 1 tablet (40 mg total) by mouth 2 (two) times daily. 11/10/12   Laurey Morale, MD  rivaroxaban (XARELTO) 10 MG TABS tablet Take 1 tablet (10 mg total) by mouth daily with breakfast. Take Xarelto for two and a half more weeks, then discontinue Xarelto. Once the patient has completed the Xarelto, they may resume the 325 mg Aspirin. 12/11/12   Alexzandrew Dara Lords, PA    Diet: Cardiac diet and Diabetic diet Activity:WBAT Follow-up:in 2 weeks on Tuesday the 28th Disposition - [facility] [place] Discharged Condition: good   Discharge Orders    Future Appointments: Provider: Department: Dept Phone: Center:   06/15/2013 10:00 AM Renato Shin, MD [place] [phone] None     Future Orders Please Complete By Expires   Diet - low sodium heart healthy      Diet Carb Modified      Call MD / Call 911      Comments:   If you experience chest pain or shortness of breath, CALL 911 and be transported to the hospital emergency room.  If you develope a fever above 101 F, pus (white drainage) or increased drainage or redness at the wound, or calf pain, call your surgeon's office.   Discharge instructions      Comments:   Pick up stool softner and laxative for home. Do not submerge incision under water. May shower. Continue to use ice for pain and swelling from surgery.  Take Xarelto for two and a half more weeks, then discontinue Xarelto. Once the patient has completed the Xarelto, they may resume the 325 mg Aspirin.  When discharged from the skilled rehab facility, please have the facility set up the patient's [place] prior to being released.  Also provide the patient with their medications at time of release from the facility to  include their pain medication, the muscle relaxants, and  their blood thinner medication.  If the patient is still at the rehab facility at time of follow up appointment, please also assist the patient in arranging follow up appointment in our office and any transportation needs.   Constipation Prevention      Comments:   Drink plenty of fluids.  Prune juice may be helpful.  You may use a stool softener, such as Colace (over the counter) 100 mg twice a day.  Use MiraLax (over the counter) for constipation as needed.   Increase activity slowly as tolerated      Patient may shower      Comments:   You may shower without a dressing once there is no drainage.  Do not wash over the wound.  If drainage remains, do not shower until drainage stops.   Weight bearing as tolerated      Driving restrictions      Comments:   No driving until released by the physician.   Lifting restrictions      Comments:   No lifting until released by the physician.   TED hose      Comments:   Use stockings (TED hose) for 3 weeks on both leg(s).  You may remove them at night for sleeping.   Change dressing      Comments:   Change dressing daily with sterile 4 x 4 inch gauze dressing and apply TED hose. Do not submerge the incision under water.   Do not put a pillow under the knee. Place it under the heel.      Do not sit on low chairs, stoools or toilet seats, as it may be difficult to get up from low surfaces          Medication List     As of 12/11/2012  9:08 AM    STOP taking these medications         aspirin 325 MG tablet      diclofenac sodium 1 % Gel   Commonly known as: VOLTAREN      naproxen 500 MG tablet   Commonly known as: NAPROSYN      TAKE these medications         acetaminophen 325 MG tablet   Commonly known as: TYLENOL   Take 2 tablets (650 mg total) by mouth every 6 (six) hours as needed (or Fever >/= 101).      atorvastatin 10 MG tablet   Commonly known as: LIPITOR   Take 10 mg by  mouth daily before breakfast.      bisacodyl 10 MG suppository   Commonly known as: DULCOLAX   Place 1 suppository (10 mg total) rectally daily as needed.      doxazosin 4 MG tablet   Commonly known as: CARDURA   Take 4 mg by mouth at bedtime.      DSS 100 MG Caps   Take 100 mg by mouth 2 (two) times daily.      dutasteride 0.5 MG capsule   Commonly known as: AVODART   Take 0.5 mg by mouth every other day.      fluticasone 50 MCG/ACT nasal spray   Commonly known as: FLONASE   Place 2 sprays into the nose daily.      furosemide 40 MG tablet   Commonly known as: LASIX   Take 40 mg by mouth daily before breakfast.      iron polysaccharides 150 MG capsule   Commonly known as: NIFEREX   Take 1  capsule (150 mg total) by mouth 2 (two) times daily.      METAMUCIL 30.9 % Powd   Generic drug: Psyllium   Take 15 mLs by mouth daily.      methocarbamol 500 MG tablet   Commonly known as: ROBAXIN   Take 1 tablet (500 mg total) by mouth every 6 (six) hours as needed.      ondansetron 4 MG tablet   Commonly known as: ZOFRAN   Take 1 tablet (4 mg total) by mouth every 6 (six) hours as needed for nausea.      oxyCODONE 5 MG immediate release tablet   Commonly known as: Oxy IR/ROXICODONE   Take 1-2 tablets (5-10 mg total) by mouth every 3 (three) hours as needed.      pantoprazole 40 MG tablet   Commonly known as: PROTONIX   Take 1 tablet (40 mg total) by mouth 2 (two) times daily.      polyethylene glycol powder powder   Commonly known as: GLYCOLAX/MIRALAX   Take 17 g by mouth daily.      rivaroxaban 10 MG Tabs tablet   Commonly known as: XARELTO   Take 1 tablet (10 mg total) by mouth daily with breakfast. Take Xarelto for two and a half more weeks, then discontinue Xarelto.  Once the patient has completed the Xarelto, they may resume the 325 mg Aspirin.           Follow-up Information    Follow up with [name], MD. Schedule an appointment as soon as possible for  a visit on 12/23/2012.   Contact information:   [address] [place] [address] [id number]          Signed: Mickel Crow 12/11/2012, 9:08 AM

## 2012-12-12 LAB — TYPE AND SCREEN
Antibody Screen: NEGATIVE
Unit division: 0

## 2012-12-12 NOTE — Progress Notes (Signed)
Clinical Social Work Department CLINICAL SOCIAL WORK PLACEMENT NOTE 12/12/2012  Patient:  [name], [name]  Account Number:  [id number] Admit date:  12/08/2012  Clinical Social Worker:  Werner Lean, LCSW  Date/time:  12/09/2012 02:21 PM  Clinical Social Work is seeking post-discharge placement for this patient at the following level of care:   SKILLED NURSING   (*CSW will update this form in Epic as items are completed)     Patient/family provided with [place] Department of Clinical Social Work's list of facilities offering this level of care within the geographic area requested by the patient (or if unable, by the patient's family).  12/09/2012  Patient/family informed of their freedom to choose among providers that offer the needed level of care, that participate in Medicare, Medicaid or managed care program needed by the patient, have an available bed and are willing to accept the patient.    Patient/family informed of [facility]' ownership interest in [facility], as well as of the fact that they are under no obligation to receive care at this facility.  PASARR submitted to EDS on 12/09/2012 PASARR number received from [employer]DS on 12/09/2012  FL2 transmitted to all facilities in geographic area requested by pt/family on  12/09/2012 FL2 transmitted to all facilities within larger geographic area on   Patient informed that his/her managed care company has contracts with or will negotiate with  certain facilities, including the following:     Patient/family informed of bed offers received:  12/09/2012 Patient chooses bed at [facility] Physician recommends and patient chooses bed at    Patient to be transferred to [facility] at [facility] on  12/11/2012 Patient to be transferred to facility by [facility]  The following physician request were entered in Epic:   Additional Comments:  Werner Lean LCSW [phone]

## 2012-12-25 DIAGNOSIS — M1710 Unilateral primary osteoarthritis, unspecified knee: Secondary | ICD-10-CM | POA: Diagnosis not present

## 2012-12-26 DIAGNOSIS — R350 Frequency of micturition: Secondary | ICD-10-CM | POA: Diagnosis not present

## 2012-12-26 DIAGNOSIS — R39198 Other difficulties with micturition: Secondary | ICD-10-CM | POA: Diagnosis not present

## 2012-12-26 DIAGNOSIS — N400 Enlarged prostate without lower urinary tract symptoms: Secondary | ICD-10-CM | POA: Diagnosis not present

## 2012-12-29 DIAGNOSIS — M1710 Unilateral primary osteoarthritis, unspecified knee: Secondary | ICD-10-CM | POA: Diagnosis not present

## 2012-12-31 DIAGNOSIS — M1710 Unilateral primary osteoarthritis, unspecified knee: Secondary | ICD-10-CM | POA: Diagnosis not present

## 2013-01-02 DIAGNOSIS — M1710 Unilateral primary osteoarthritis, unspecified knee: Secondary | ICD-10-CM | POA: Diagnosis not present

## 2013-01-05 DIAGNOSIS — M1710 Unilateral primary osteoarthritis, unspecified knee: Secondary | ICD-10-CM | POA: Diagnosis not present

## 2013-01-13 DIAGNOSIS — M1710 Unilateral primary osteoarthritis, unspecified knee: Secondary | ICD-10-CM | POA: Diagnosis not present

## 2013-01-15 DIAGNOSIS — Z96659 Presence of unspecified artificial knee joint: Secondary | ICD-10-CM | POA: Diagnosis not present

## 2013-01-15 DIAGNOSIS — M1710 Unilateral primary osteoarthritis, unspecified knee: Secondary | ICD-10-CM | POA: Diagnosis not present

## 2013-01-19 DIAGNOSIS — M1710 Unilateral primary osteoarthritis, unspecified knee: Secondary | ICD-10-CM | POA: Diagnosis not present

## 2013-01-21 ENCOUNTER — Other Ambulatory Visit: Payer: Self-pay | Admitting: Family Medicine

## 2013-01-22 DIAGNOSIS — M1710 Unilateral primary osteoarthritis, unspecified knee: Secondary | ICD-10-CM | POA: Diagnosis not present

## 2013-01-28 DIAGNOSIS — M1710 Unilateral primary osteoarthritis, unspecified knee: Secondary | ICD-10-CM | POA: Diagnosis not present

## 2013-02-02 DIAGNOSIS — M1710 Unilateral primary osteoarthritis, unspecified knee: Secondary | ICD-10-CM | POA: Diagnosis not present

## 2013-02-03 ENCOUNTER — Ambulatory Visit (INDEPENDENT_AMBULATORY_CARE_PROVIDER_SITE_OTHER): Payer: Medicare Other | Admitting: Family Medicine

## 2013-02-03 ENCOUNTER — Encounter: Payer: Self-pay | Admitting: Family Medicine

## 2013-02-03 VITALS — BP 130/80 | HR 96 | Temp 98.8°F | Wt 208.0 lb

## 2013-02-03 DIAGNOSIS — N138 Other obstructive and reflux uropathy: Secondary | ICD-10-CM

## 2013-02-03 DIAGNOSIS — N139 Obstructive and reflux uropathy, unspecified: Secondary | ICD-10-CM | POA: Diagnosis not present

## 2013-02-03 DIAGNOSIS — I10 Essential (primary) hypertension: Secondary | ICD-10-CM | POA: Diagnosis not present

## 2013-02-03 DIAGNOSIS — N401 Enlarged prostate with lower urinary tract symptoms: Secondary | ICD-10-CM | POA: Diagnosis not present

## 2013-02-03 NOTE — Progress Notes (Signed)
  Subjective:    Patient ID: [name], male    DOB: 08-05-1933, 79 y.o.   MRN: [id number]  HPI Here for a BP check and a prostate exam. His last PSA was over a year ago. He feels great in general. He had his knee replaced in January and his recovery is going quite well.    Review of Systems  Constitutional: Negative.   Respiratory: Negative.   Cardiovascular: Negative.   Genitourinary: Negative.        Objective:   Physical Exam  Constitutional: He appears well-developed and well-nourished.  Cardiovascular: Normal rate, regular rhythm, normal heart sounds and intact distal pulses.   Pulmonary/Chest: Effort normal and breath sounds normal.  Genitourinary: Rectum normal, prostate normal and penis normal. Guaiac negative stool.          Assessment & Plan:  His HTN is well controlled. Get a PSA.

## 2013-02-06 ENCOUNTER — Other Ambulatory Visit: Payer: Self-pay | Admitting: Family Medicine

## 2013-02-06 DIAGNOSIS — M1710 Unilateral primary osteoarthritis, unspecified knee: Secondary | ICD-10-CM | POA: Diagnosis not present

## 2013-02-06 NOTE — Progress Notes (Signed)
Quick Note:  I spoke with pt and released results to my chart. ______

## 2013-02-09 DIAGNOSIS — M1710 Unilateral primary osteoarthritis, unspecified knee: Secondary | ICD-10-CM | POA: Diagnosis not present

## 2013-02-13 ENCOUNTER — Other Ambulatory Visit: Payer: Self-pay | Admitting: Family Medicine

## 2013-02-22 IMAGING — CT CT ANGIO CHEST
2 of 7 series · 18 of 37 positions shown · IV contrast (Omnipaque 300)
Comparison: 01/10/2011

CLINICAL DATA: Follow-up pulmonary emboli seen on 01/10/2011.
History of DVT.

CT ANGIOGRAPHY CHEST WITH CONTRAST
TECHNIQUE: Multidetector CT imaging of the chest was performed
using the standard protocol during bolus administration of
intravenous contrast.  Multiplanar CT image reconstructions
including MIPs were obtained to evaluate the vascular anatomy.
Contrast:  100 ml Hmnipaque-222

[Series 5: thins (id) / (id) · axial · 0.77mm/px · z∈[-247,-16]mm · 17 of 257 slices shown]
[im 13/257  lung]
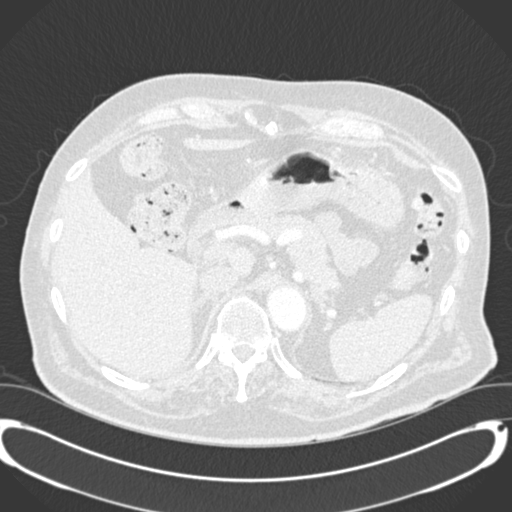
[im 26/257  mediastinal]
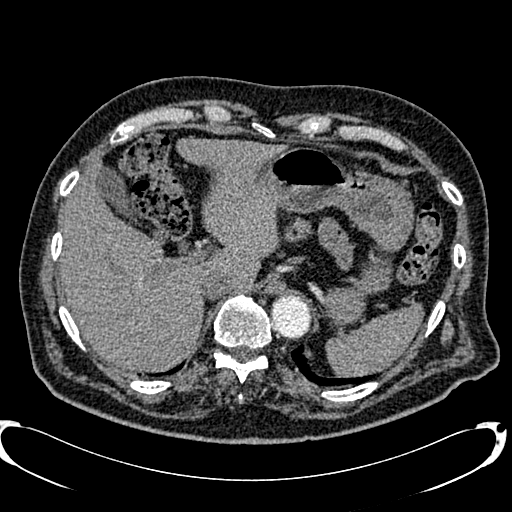
[im 39/257  lung]
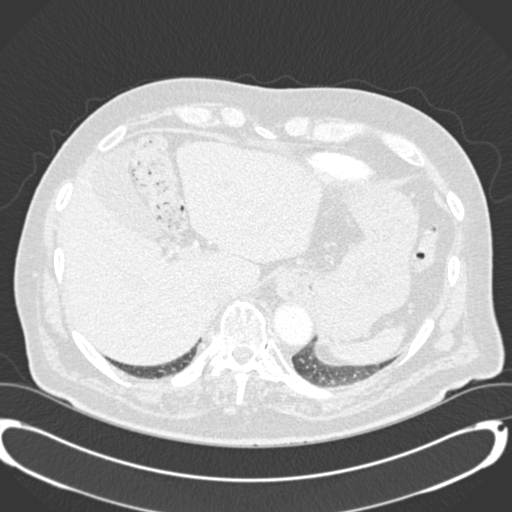
[im 52/257  mediastinal]
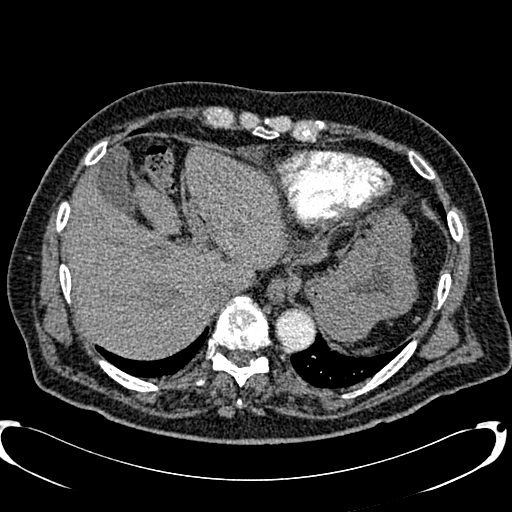
[im 77/257  lung]
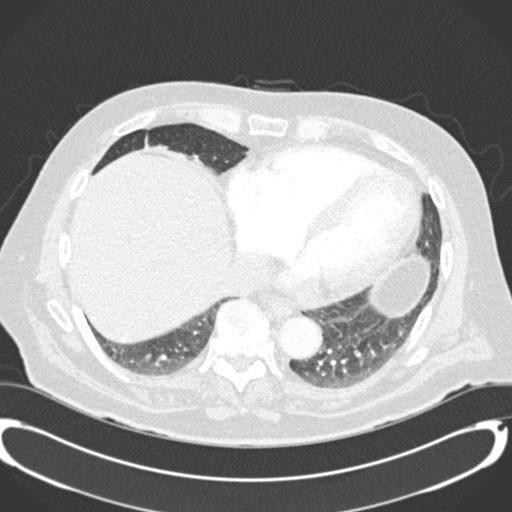
[im 90/257  mediastinal]
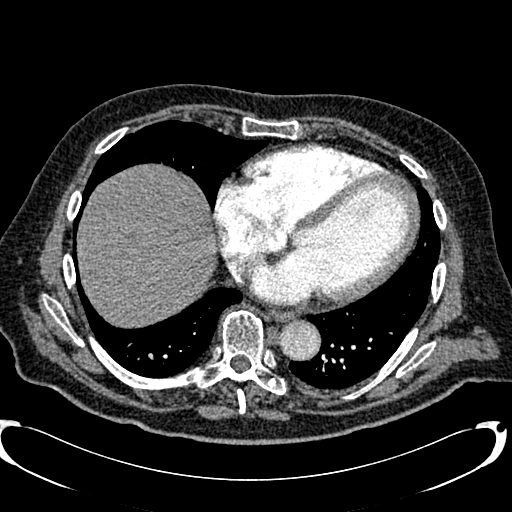
[im 103/257  lung]
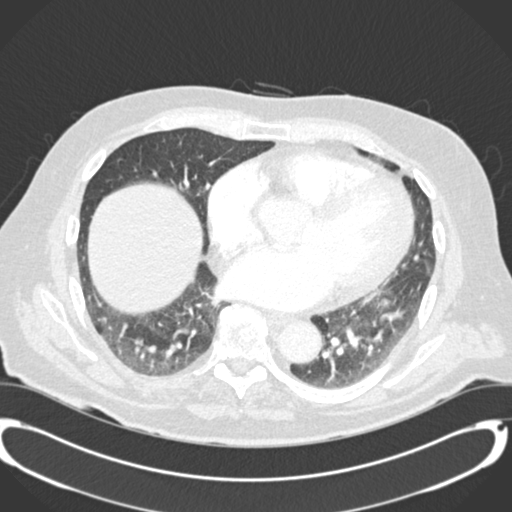
[im 116/257  mediastinal]
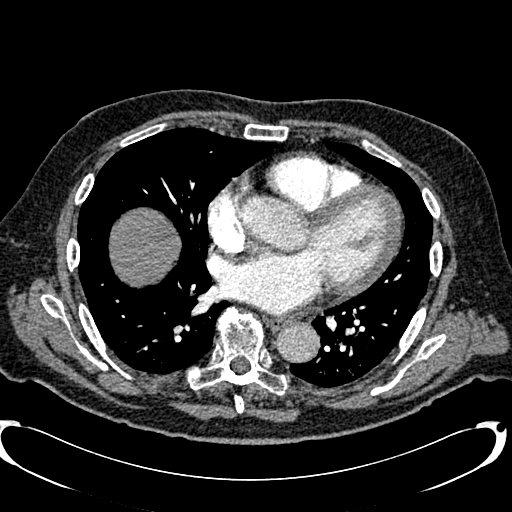
[im 129/257  lung]
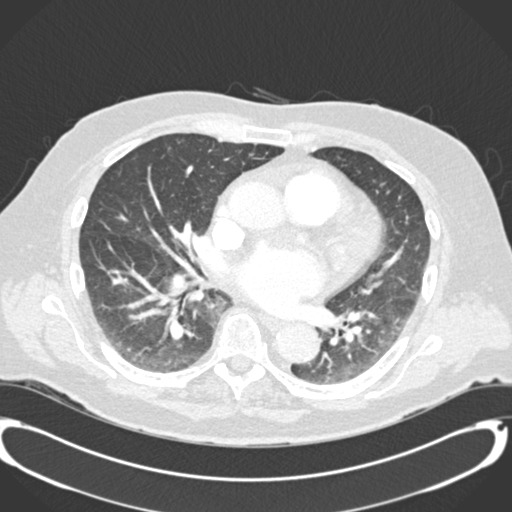
[im 141/257  mediastinal]
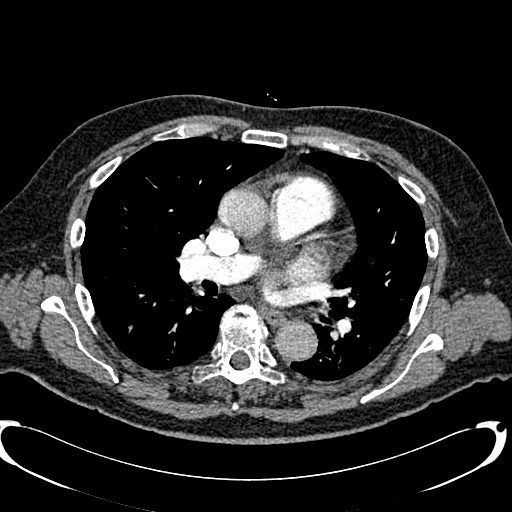
[im 154/257  lung]
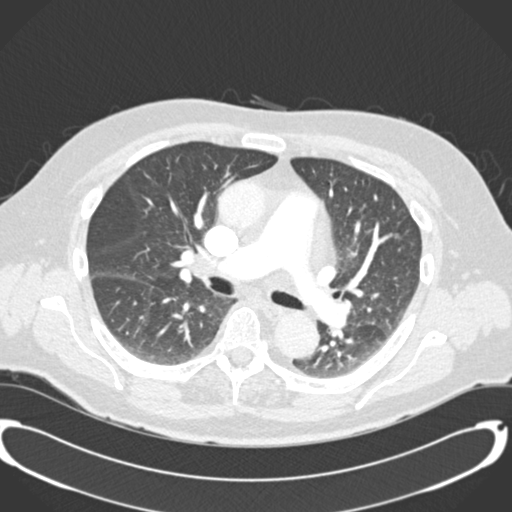
[im 167/257  mediastinal]
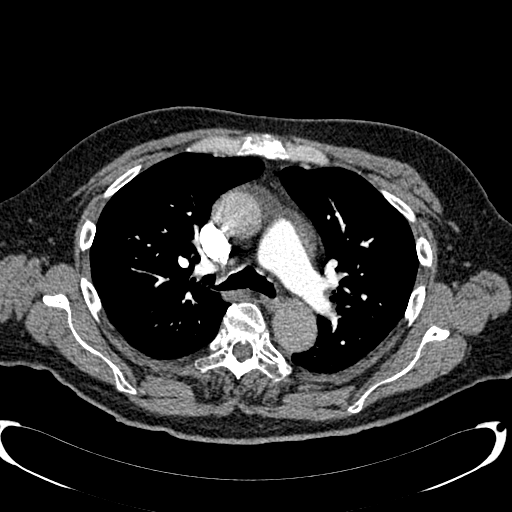
[im 180/257  lung]
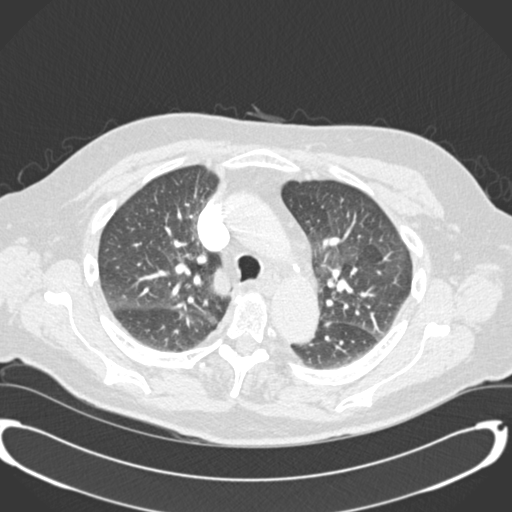
[im 205/257  mediastinal]
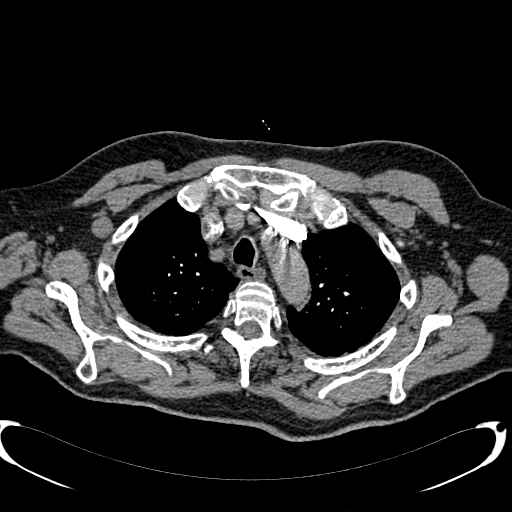
[im 218/257  lung]
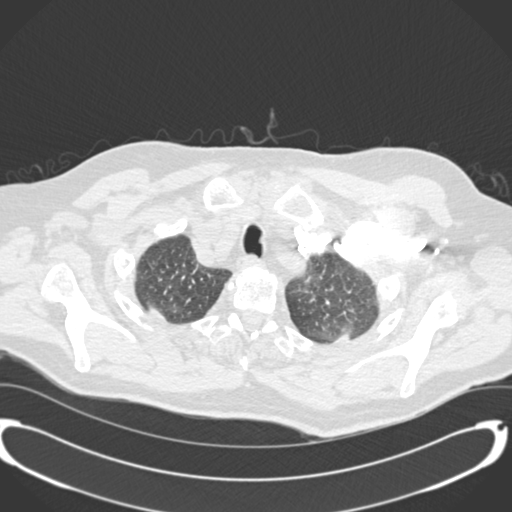
[im 231/257  mediastinal]
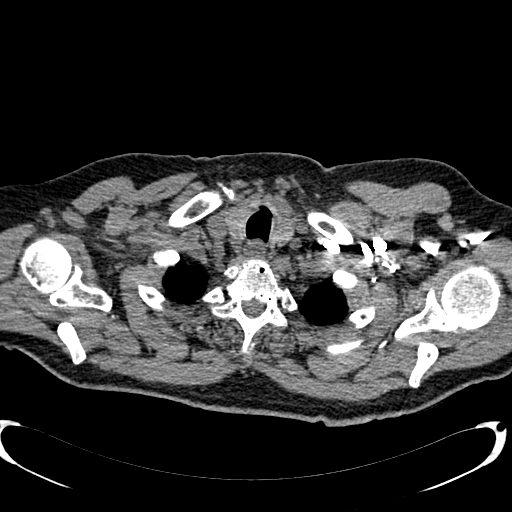
[im 244/257  lung]
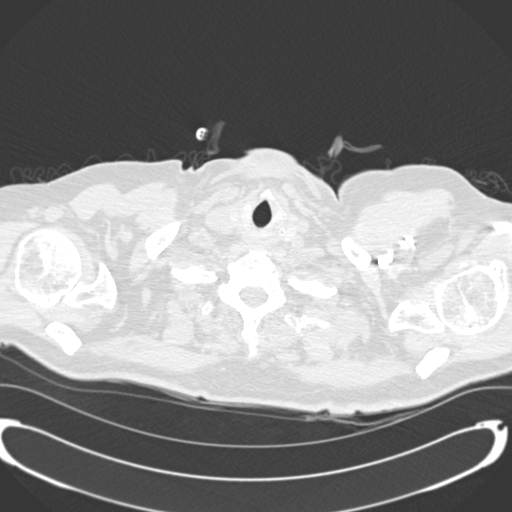

[Series 602: cor mpr · coronal · 0.77mm/px · 1 of 106 slices shown]
[im 53/106  mediastinal]
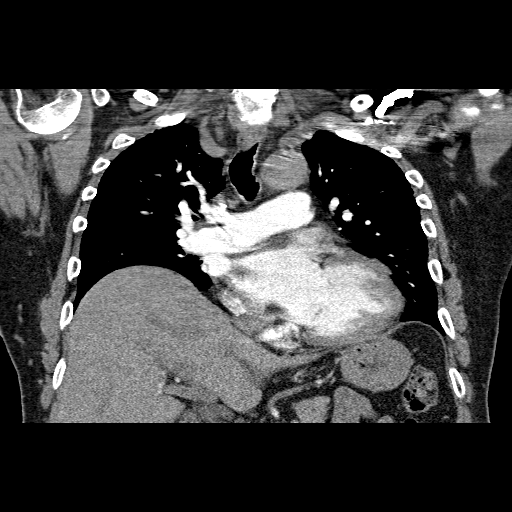

[18 of 37 positions shown; findings below may reference images not displayed]

FINDINGS: Image quality is degraded by respiratory motion.  There
are no residual or new filling defects in the pulmonary arterial
tree. A high right paratracheal lymph node measures 8 mm ((image
8), stable.  No pathologically enlarged mediastinal, hilar or
axillary lymph nodes.  Heart is mildly enlarged.  No pericardial
effusion.  A 10 mm short axis lymph node is seen adjacent to the
distal descending thoracic aorta (image 54), stable.

There is rather diffuse ground-glass, with areas of septal
thickening in the apices and lung bases.  No pleural fluid.  A tiny
nodule in the subpleural right upper lobe (image 16) is unchanged.
Airway is unremarkable.

Incidental imaging of the upper abdomen shows no acute findings.
No worrisome lytic or sclerotic lesions.

Review of the MIP images confirms the above findings.
IMPRESSION: 1.  No residual or new pulmonary emboli.
2.  Pulmonary parenchymal findings may be due in part to expiratory
phase imaging. However, mild pulmonary edema is suspected.
3.  A borderline enlarged lymph node adjacent to the distal
descending thoracic aorta is stable.
4.  Tiny right upper lobe nodule is unchanged. If the patient is at
high risk for bronchogenic carcinoma, follow-up chest CT at 1 year
is recommended.  If the patient is at low risk, no follow-up is
needed.  This recommendation follows the consensus statement:
Guidelines for Management of Small Pulmonary Nodules Detected on CT
Scans:  A Statement from the [HOSPITAL] as published in
[URL]

## 2013-03-25 ENCOUNTER — Other Ambulatory Visit: Payer: Self-pay | Admitting: Dermatology

## 2013-03-25 DIAGNOSIS — Z85828 Personal history of other malignant neoplasm of skin: Secondary | ICD-10-CM | POA: Diagnosis not present

## 2013-03-25 DIAGNOSIS — L820 Inflamed seborrheic keratosis: Secondary | ICD-10-CM | POA: Diagnosis not present

## 2013-03-25 DIAGNOSIS — L570 Actinic keratosis: Secondary | ICD-10-CM | POA: Diagnosis not present

## 2013-03-25 DIAGNOSIS — C44711 Basal cell carcinoma of skin of unspecified lower limb, including hip: Secondary | ICD-10-CM | POA: Diagnosis not present

## 2013-03-25 DIAGNOSIS — L821 Other seborrheic keratosis: Secondary | ICD-10-CM | POA: Diagnosis not present

## 2013-03-25 DIAGNOSIS — L819 Disorder of pigmentation, unspecified: Secondary | ICD-10-CM | POA: Diagnosis not present

## 2013-03-25 DIAGNOSIS — D485 Neoplasm of uncertain behavior of skin: Secondary | ICD-10-CM | POA: Diagnosis not present

## 2013-04-17 DIAGNOSIS — N400 Enlarged prostate without lower urinary tract symptoms: Secondary | ICD-10-CM | POA: Diagnosis not present

## 2013-04-17 DIAGNOSIS — R350 Frequency of micturition: Secondary | ICD-10-CM | POA: Diagnosis not present

## 2013-05-04 ENCOUNTER — Other Ambulatory Visit: Payer: Self-pay | Admitting: Family Medicine

## 2013-05-26 DIAGNOSIS — M7989 Other specified soft tissue disorders: Secondary | ICD-10-CM | POA: Diagnosis not present

## 2013-05-26 DIAGNOSIS — I87029 Postthrombotic syndrome with inflammation of unspecified lower extremity: Secondary | ICD-10-CM | POA: Diagnosis not present

## 2013-05-26 DIAGNOSIS — M79609 Pain in unspecified limb: Secondary | ICD-10-CM | POA: Diagnosis not present

## 2013-05-28 DIAGNOSIS — M79609 Pain in unspecified limb: Secondary | ICD-10-CM | POA: Diagnosis not present

## 2013-05-28 DIAGNOSIS — I87029 Postthrombotic syndrome with inflammation of unspecified lower extremity: Secondary | ICD-10-CM | POA: Diagnosis not present

## 2013-05-28 DIAGNOSIS — M7989 Other specified soft tissue disorders: Secondary | ICD-10-CM | POA: Diagnosis not present

## 2013-05-28 HISTORY — PX: ENDOVENOUS ABLATION SAPHENOUS VEIN W/ LASER: SUR449

## 2013-06-02 ENCOUNTER — Ambulatory Visit (INDEPENDENT_AMBULATORY_CARE_PROVIDER_SITE_OTHER): Payer: Medicare Other | Admitting: Family Medicine

## 2013-06-02 ENCOUNTER — Encounter: Payer: Self-pay | Admitting: Family Medicine

## 2013-06-02 VITALS — BP 140/80 | HR 88 | Temp 98.7°F | Wt 208.0 lb

## 2013-06-02 DIAGNOSIS — M1990 Unspecified osteoarthritis, unspecified site: Secondary | ICD-10-CM

## 2013-06-02 DIAGNOSIS — R609 Edema, unspecified: Secondary | ICD-10-CM | POA: Diagnosis not present

## 2013-06-02 DIAGNOSIS — I82409 Acute embolism and thrombosis of unspecified deep veins of unspecified lower extremity: Secondary | ICD-10-CM

## 2013-06-02 DIAGNOSIS — I82401 Acute embolism and thrombosis of unspecified deep veins of right lower extremity: Secondary | ICD-10-CM

## 2013-06-02 DIAGNOSIS — I10 Essential (primary) hypertension: Secondary | ICD-10-CM

## 2013-06-02 NOTE — Progress Notes (Signed)
  Subjective:    Patient ID: [name], male    DOB: 01-Feb-1933, 79 y.o.   MRN: [id number]  HPI Here to follow up after a bilateral leg vein ablation with lasers last week. He did quite well and now he will wear knee high compression stockings for the next 6 months. He has mild swelling in the feet only. He is active and playing golf several days a week. His BP is stable.    Review of Systems  Constitutional: Negative.   Respiratory: Negative.   Cardiovascular: Positive for leg swelling. Negative for chest pain and palpitations.       Objective:   Physical Exam  Constitutional: He appears well-developed and well-nourished.  Cardiovascular: Normal rate, regular rhythm, normal heart sounds and intact distal pulses.   Pulmonary/Chest: Effort normal and breath sounds normal. No respiratory distress. He has no wheezes. He has no rales.  Musculoskeletal:  1+ edema in both lower legs           Assessment & Plan:  He is doing well. Recheck in 6 months.

## 2013-06-04 ENCOUNTER — Encounter: Payer: Self-pay | Admitting: Gastroenterology

## 2013-06-12 ENCOUNTER — Encounter: Payer: Self-pay | Admitting: Gastroenterology

## 2013-06-15 ENCOUNTER — Encounter: Payer: Self-pay | Admitting: Endocrinology

## 2013-06-15 ENCOUNTER — Ambulatory Visit (INDEPENDENT_AMBULATORY_CARE_PROVIDER_SITE_OTHER): Payer: Medicare Other | Admitting: Endocrinology

## 2013-06-15 VITALS — BP 126/80 | HR 80 | Ht 70.0 in | Wt 209.0 lb

## 2013-06-15 DIAGNOSIS — E210 Primary hyperparathyroidism: Secondary | ICD-10-CM | POA: Diagnosis not present

## 2013-06-15 NOTE — Progress Notes (Signed)
Subjective:    Patient ID: [name], male    DOB: Apr 13, 1933, 79 y.o.   MRN: [id number]  HPI Pt is here to f/u primary hyperparathyroidism (dx'ed 2009; he has not required surgery; it improved with discontinuation of hctz, but then recurred off it; he has never had bony fracture or urolithiasis).  He denies numbness and muscle weakness.  Past Medical History  Diagnosis Date  . GERD (gastroesophageal reflux disease)   . Allergy   . Normal cardiac stress test     10-29-08  . Diverticulosis   . ED (erectile dysfunction)   . BPH (benign prostatic hypertrophy)   . Osteoarthritis   . Headache(784.0)   . Personal history of colonic polyps   . Hypertension   . Other and unspecified hyperlipidemia   . Hypercalcemia     sees Dr Loanne Drilling  . Pulmonary embolism     01-10-11  . DVT of lower extremity (deep venous thrombosis)     right leg, 01-10-11  . Neuromuscular disorder     sciatica  . Hyperparathyroidism     Past Surgical History  Procedure Laterality Date  . Knee arthroscopy      rt knee  . Joint replacement      rt knee replacement 2003 Dr Wynelle Link  . Basal cell carcinoma excision      removed face back Dr Sherrye Payor, had MOHS Dr Sarajane Jews  . Tonsillectomy    . Hernia repair      Inguinal right Dr Rise Patience  3-05  . Knee arthroscopy      left knee 2011 Dr Wynelle Link   . Colonoscopy  07-23-08    per Dr. Deatra Ina, polyps, repeat in 5 yrs   . Vasectomy    . Total knee arthroplasty  12/08/2012    Procedure: TOTAL KNEE ARTHROPLASTY;  Surgeon: [name], MD;  Location: [facility];  Service: Orthopedics;  Laterality: Left;  . Endovenous ablation saphenous vein w/ laser Bilateral 05-28-13    per Dr. Renaldo Reel     History   Social History  . Marital Status: Married    Spouse Name: N/A    Number of Children: N/A  . Years of Education: N/A   Occupational History  . retired    Social History Main Topics  . Smoking status: Former Smoker -- 0.50 packs/day for 4 years   Types: Cigarettes    Quit date: 11/26/1961  . Smokeless tobacco: Never Used  . Alcohol Use: 0.0 oz/week     Comment: 2 scotch drinks a day  . Drug Use: No  . Sexually Active: Not on file   Other Topics Concern  . Not on file   Social History Narrative   Daily caffeine use    Current Outpatient Prescriptions on File Prior to Visit  Medication Sig Dispense Refill  . acetaminophen (TYLENOL) 325 MG tablet Take 2 tablets (650 mg total) by mouth every 6 (six) hours as needed (or Fever >/= 101).  60 tablet  0  . atorvastatin (LIPITOR) 10 MG tablet Take 10 mg by mouth daily before breakfast.      . bisacodyl (DULCOLAX) 10 MG suppository Place 1 suppository (10 mg total) rectally daily as needed.  12 suppository  0  . doxazosin (CARDURA) 4 MG tablet Take 4 mg by mouth at bedtime.      . dutasteride (AVODART) 0.5 MG capsule Take 0.5 mg by mouth daily.       . fluticasone (FLONASE) 50 MCG/ACT nasal spray Place 2 sprays  into the nose daily.      . furosemide (LASIX) 40 MG tablet Take 40 mg by mouth daily before breakfast.      . naproxen (NAPROSYN) 500 MG tablet Take 500 mg by mouth 2 (two) times daily with a meal.       . pantoprazole (PROTONIX) 40 MG tablet Take 1 tablet (40 mg total) by mouth 2 (two) times daily.  180 tablet  3  . polyethylene glycol powder (GLYCOLAX/MIRALAX) powder Take 17 g by mouth daily.       . Psyllium (METAMUCIL) 30.9 % POWD Take 15 mLs by mouth daily.       . silodosin (RAPAFLO) 8 MG CAPS capsule Take 8 mg by mouth daily with breakfast.       Current Facility-Administered Medications on File Prior to Visit  Medication Dose Route Frequency Provider Last Rate Last Dose  . bupivacaine liposome (EXPAREL) 1.3 % injection 266 mg  20 mL Infiltration Once [name], MD        No Known Allergies  Family History  Problem Relation Age of Onset  . Heart disease Mother   . Arthritis Other   . Cancer Other     Colon 1st degree relative <60    BP 126/80  Pulse 80   Ht 5\' 10"  (1.778 m)  Wt 209 lb (94.802 kg)  BMI 29.99 kg/m2  SpO2 98%  Review of Systems Denies falls.    Objective:   Physical Exam VITAL SIGNS:  See vs page GENERAL: no distress Gait: normal and steady     Assessment & Plan:  Primary hyperparathyroidism.  Plan is to follow if and until he develops an indication for surgery.

## 2013-06-15 NOTE — Patient Instructions (Signed)
blood tests are being requested for you today.  We'll contact you with results.  Please come back for a follow-up appointment in 1 year.

## 2013-06-16 LAB — PTH, INTACT AND CALCIUM: Calcium: 11.6 mg/dL — ABNORMAL HIGH (ref 8.4–10.5)

## 2013-07-01 DIAGNOSIS — H43819 Vitreous degeneration, unspecified eye: Secondary | ICD-10-CM | POA: Diagnosis not present

## 2013-07-02 DIAGNOSIS — Z96659 Presence of unspecified artificial knee joint: Secondary | ICD-10-CM | POA: Diagnosis not present

## 2013-07-17 ENCOUNTER — Other Ambulatory Visit: Payer: Self-pay | Admitting: Dermatology

## 2013-07-17 DIAGNOSIS — C44320 Squamous cell carcinoma of skin of unspecified parts of face: Secondary | ICD-10-CM | POA: Diagnosis not present

## 2013-07-17 DIAGNOSIS — B353 Tinea pedis: Secondary | ICD-10-CM | POA: Diagnosis not present

## 2013-07-17 DIAGNOSIS — Z85828 Personal history of other malignant neoplasm of skin: Secondary | ICD-10-CM | POA: Diagnosis not present

## 2013-07-17 DIAGNOSIS — L570 Actinic keratosis: Secondary | ICD-10-CM | POA: Diagnosis not present

## 2013-07-17 DIAGNOSIS — D0420 Carcinoma in situ of skin of unspecified ear and external auricular canal: Secondary | ICD-10-CM | POA: Diagnosis not present

## 2013-07-29 ENCOUNTER — Ambulatory Visit (AMBULATORY_SURGERY_CENTER): Payer: Self-pay

## 2013-07-29 VITALS — Ht 70.0 in | Wt 209.0 lb

## 2013-07-29 DIAGNOSIS — Z860100 Personal history of colon polyps, unspecified: Secondary | ICD-10-CM

## 2013-07-29 MED ORDER — SUPREP BOWEL PREP KIT 17.5-3.13-1.6 GM/177ML PO SOLN: 1.0000 | Freq: Once | ORAL | Status: DC

## 2013-08-12 ENCOUNTER — Encounter: Payer: Medicare Other | Admitting: Gastroenterology

## 2013-08-26 DIAGNOSIS — I87029 Postthrombotic syndrome with inflammation of unspecified lower extremity: Secondary | ICD-10-CM | POA: Diagnosis not present

## 2013-09-01 DIAGNOSIS — Z96659 Presence of unspecified artificial knee joint: Secondary | ICD-10-CM | POA: Diagnosis not present

## 2013-09-07 DIAGNOSIS — B353 Tinea pedis: Secondary | ICD-10-CM | POA: Diagnosis not present

## 2013-09-07 DIAGNOSIS — L723 Sebaceous cyst: Secondary | ICD-10-CM | POA: Diagnosis not present

## 2013-09-07 DIAGNOSIS — Z85828 Personal history of other malignant neoplasm of skin: Secondary | ICD-10-CM | POA: Diagnosis not present

## 2013-09-07 DIAGNOSIS — L570 Actinic keratosis: Secondary | ICD-10-CM | POA: Diagnosis not present

## 2013-09-07 DIAGNOSIS — L739 Follicular disorder, unspecified: Secondary | ICD-10-CM | POA: Diagnosis not present

## 2013-09-07 DIAGNOSIS — L821 Other seborrheic keratosis: Secondary | ICD-10-CM | POA: Diagnosis not present

## 2013-09-16 ENCOUNTER — Encounter: Payer: Self-pay | Admitting: Gastroenterology

## 2013-09-16 ENCOUNTER — Ambulatory Visit (AMBULATORY_SURGERY_CENTER): Payer: Medicare Other | Admitting: Gastroenterology

## 2013-09-16 VITALS — BP 126/75 | HR 59 | Temp 97.9°F | Resp 15 | Ht 70.0 in | Wt 209.0 lb

## 2013-09-16 DIAGNOSIS — K648 Other hemorrhoids: Secondary | ICD-10-CM

## 2013-09-16 DIAGNOSIS — I10 Essential (primary) hypertension: Secondary | ICD-10-CM | POA: Diagnosis not present

## 2013-09-16 DIAGNOSIS — D126 Benign neoplasm of colon, unspecified: Secondary | ICD-10-CM

## 2013-09-16 DIAGNOSIS — K5730 Diverticulosis of large intestine without perforation or abscess without bleeding: Secondary | ICD-10-CM

## 2013-09-16 DIAGNOSIS — Z86010 Personal history of colonic polyps: Secondary | ICD-10-CM | POA: Diagnosis not present

## 2013-09-16 DIAGNOSIS — K5732 Diverticulitis of large intestine without perforation or abscess without bleeding: Secondary | ICD-10-CM | POA: Diagnosis not present

## 2013-09-16 DIAGNOSIS — Z800 Family history of malignant neoplasm of digestive organs: Secondary | ICD-10-CM

## 2013-09-16 DIAGNOSIS — Z1211 Encounter for screening for malignant neoplasm of colon: Secondary | ICD-10-CM | POA: Diagnosis not present

## 2013-09-16 DIAGNOSIS — K219 Gastro-esophageal reflux disease without esophagitis: Secondary | ICD-10-CM | POA: Diagnosis not present

## 2013-09-16 DIAGNOSIS — I739 Peripheral vascular disease, unspecified: Secondary | ICD-10-CM | POA: Diagnosis not present

## 2013-09-16 HISTORY — PX: COLONOSCOPY: SHX174

## 2013-09-16 MED ORDER — SODIUM CHLORIDE 0.9 % IV SOLN: 500.0000 mL | INTRAVENOUS | Status: DC

## 2013-09-16 NOTE — Progress Notes (Signed)
Called to room to assist during endoscopic procedure.  Patient ID and intended procedure confirmed with present staff. Received instructions for my participation in the procedure from the performing physician.  

## 2013-09-16 NOTE — Op Note (Signed)
[facility]  [address] [place], [address]   COLONOSCOPY PROCEDURE REPORT  PATIENT: [name], [name].  MR#: [id number] BIRTHDATE: August 02, 1933 , 79  yrs. old GENDER: Male ENDOSCOPIST: Inda Castle, MD REFERRED BY: PROCEDURE DATE:  09/16/2013 PROCEDURE:   Colonoscopy with snare polypectomy and Colonoscopy with cold biopsy polypectomy First Screening Colonoscopy - Avg.  risk and is 50 yrs.  old or older - No.  Prior Negative Screening - Now for repeat screening. N/A  History of Adenoma - Now for follow-up colonoscopy & has been > or = to 3 yrs.  Yes hx of adenoma.  Has been 3 or more years since last colonoscopy.  Polyps Removed Today? Yes. ASA CLASS:   Class II INDICATIONS:Patient's personal history of adenomatous colon polyps 2009 and Patient's immediate family history of colon cancer. MEDICATIONS: MAC sedation, administered by CRNA, propofol (Diprivan) 450mg  IV, and Glucagon 1 mg IV  DESCRIPTION OF PROCEDURE:   After the risks benefits and alternatives of the procedure were thoroughly explained, informed consent was obtained.  A digital rectal exam revealed no abnormalities of the rectum.   The LB [id number]  endoscope was introduced through the anus and advanced to the cecum, which was identified by both the appendix and ileocecal valve. No adverse events experienced.   The quality of the prep was Suprep good  The instrument was then slowly withdrawn as the colon was fully examined.      COLON FINDINGS: A sessile polyp measuring 1 cm in size was found in the ascending colon.  A polypectomy was performed with a cold snare.  The resection was complete and the polyp tissue was completely retrieved.   A sessile polyp measuring 2 mm in size was found at the splenic flexure.  A polypectomy was performed with cold forceps.   A lipoma was found in the descending colon.   There was severe diverticulosis noted in the sigmoid colon and descending colon  with associated muscular hypertrophy, colonic narrowing, luminal narrowing and colonic spasm.   Internal hemorrhoids were found.  Retroflexed views revealed no abnormalities. The time to cecum=17 minutes 23 seconds.  Withdrawal time=10 minutes 17 seconds.  The scope was withdrawn and the procedure completed. COMPLICATIONS: There were no complications.  ENDOSCOPIC IMPRESSION: 1.   Sessile polyp measuring 1 cm in size was found in the ascending colon; polypectomy was performed with a cold snare 2.   Sessile polyp measuring 2 mm in size was found at the splenic flexure; polypectomy was performed with cold forceps 3.   Lipoma in the descending colon 4.   There was severe diverticulosis noted in the sigmoid colon and descending colon 5.   Internal hemorrhoids  RECOMMENDATIONS: Given your age, you will not need another colonoscopy for colon cancer screening or polyp surveillance.  These types of tests usually stop around the age 68.   eSigned:  Inda Castle, MD 09/16/2013 11:57 AM   cc: Laurey Morale, MD and Marylynn Pearson MD   PATIENT NAME:  [name], [name]. MR#: [id number]

## 2013-09-16 NOTE — Progress Notes (Signed)
Patient did not experience any of the following events: a burn prior to discharge; a fall within the facility; wrong site/side/patient/procedure/implant event; or a hospital transfer or hospital admission upon discharge from the facility. (G8907) Patient did not have preoperative order for IV antibiotic SSI prophylaxis. (G8918)  

## 2013-09-16 NOTE — Progress Notes (Signed)
Report to pacu rn, vss, bbs=clear 

## 2013-09-16 NOTE — Patient Instructions (Signed)
Colon polyps removed, diverticulosis seen and hemorrhoids. Handouts given. Resume current medications. Call us with any questions or concerns. Thank you!!  YOU HAD AN ENDOSCOPIC PROCEDURE TODAY AT [facility]: Refer to the procedure report that was given to you for any specific questions about what was found during the examination.  If the procedure report does not answer your questions, please call your gastroenterologist to clarify.  If you requested that your care partner not be given the details of your procedure findings, then the procedure report has been included in a sealed envelope for you to review at your convenience later.  YOU SHOULD EXPECT: Some feelings of bloating in the abdomen. Passage of more gas than usual.  Walking can help get rid of the air that was put into your GI tract during the procedure and reduce the bloating. If you had a lower endoscopy (such as a colonoscopy or flexible sigmoidoscopy) you may notice spotting of blood in your stool or on the toilet paper. If you underwent a bowel prep for your procedure, then you may not have a normal bowel movement for a few days.  DIET: Your first meal following the procedure should be a light meal and then it is ok to progress to your normal diet.  A half-sandwich or bowl of soup is an example of a good first meal.  Heavy or fried foods are harder to digest and may make you feel nauseous or bloated.  Likewise meals heavy in dairy and vegetables can cause extra gas to form and this can also increase the bloating.  Drink plenty of fluids but you should avoid alcoholic beverages for 24 hours.  ACTIVITY: Your care partner should take you home directly after the procedure.  You should plan to take it easy, moving slowly for the rest of the day.  You can resume normal activity the day after the procedure however you should NOT DRIVE or use heavy machinery for 24 hours (because of the sedation medicines used during the test).     SYMPTOMS TO REPORT IMMEDIATELY: A gastroenterologist can be reached at any hour.  During normal business hours, 8:30 AM to 5:00 PM Monday through Friday, call [phone].  After hours and on weekends, please call the GI answering service at [phone] who will take a message and have the physician on call contact you.   Following lower endoscopy (colonoscopy or flexible sigmoidoscopy):  Excessive amounts of blood in the stool  Significant tenderness or worsening of abdominal pains  Swelling of the abdomen that is new, acute  Fever of 100F or higher  Following upper endoscopy (EGD)  Vomiting of blood or coffee ground material  New chest pain or pain under the shoulder blades  Painful or persistently difficult swallowing  New shortness of breath  Fever of 100F or higher  Black, tarry-looking stools  FOLLOW UP: If any biopsies were taken you will be contacted by phone or by letter within the next 1-3 weeks.  Call your gastroenterologist if you have not heard about the biopsies in 3 weeks.  Our staff will call the home number listed on your records the next business day following your procedure to check on you and address any questions or concerns that you may have at that time regarding the information given to you following your procedure. This is a courtesy call and so if there is no answer at the home number and we have not heard from you through the emergency physician  on call, we will assume that you have returned to your regular daily activities without incident.  SIGNATURES/CONFIDENTIALITY: You and/or your care partner have signed paperwork which will be entered into your electronic medical record.  These signatures attest to the fact that that the information above on your After Visit Summary has been reviewed and is understood.  Full responsibility of the confidentiality of this discharge information lies with you and/or your care-partner.

## 2013-09-17 ENCOUNTER — Telehealth: Payer: Self-pay | Admitting: *Deleted

## 2013-09-17 NOTE — Telephone Encounter (Signed)
  Follow up Call-  Call back number 09/16/2013  Post procedure Call Back phone  # [phone]  Permission to leave phone message Yes     Patient questions:  Do you have a fever, pain , or abdominal swelling? no Pain Score  0 *  Have you tolerated food without any problems? yes  Have you been able to return to your normal activities? yes  Do you have any questions about your discharge instructions: Diet   no Medications  no Follow up visit  no  Do you have questions or concerns about your Care? yes  Actions: * If pain score is 4 or above: No action needed, pain <4. Patient questioned whether he needed to see his PCP regarding diverticulosis finding. Discussed with patient handouts on diverticulosis, high fiber and when to call PCP with symptoms of divertticulitis. Patient stating he already takes Metamucil and is aware of when to call PCP to receive antibiotic for diverticulosis. All questions answered to patient's satisfaction.

## 2013-09-22 ENCOUNTER — Other Ambulatory Visit: Payer: Self-pay | Admitting: Dermatology

## 2013-09-22 ENCOUNTER — Encounter: Payer: Self-pay | Admitting: Gastroenterology

## 2013-09-22 DIAGNOSIS — Z85828 Personal history of other malignant neoplasm of skin: Secondary | ICD-10-CM | POA: Diagnosis not present

## 2013-09-22 DIAGNOSIS — B353 Tinea pedis: Secondary | ICD-10-CM | POA: Diagnosis not present

## 2013-09-22 DIAGNOSIS — L723 Sebaceous cyst: Secondary | ICD-10-CM | POA: Diagnosis not present

## 2013-09-28 ENCOUNTER — Ambulatory Visit (INDEPENDENT_AMBULATORY_CARE_PROVIDER_SITE_OTHER): Payer: Medicare Other | Admitting: Family Medicine

## 2013-09-28 ENCOUNTER — Encounter: Payer: Self-pay | Admitting: Family Medicine

## 2013-09-28 VITALS — BP 130/80 | HR 77 | Temp 98.5°F | Wt 210.0 lb

## 2013-09-28 DIAGNOSIS — N509 Disorder of male genital organs, unspecified: Secondary | ICD-10-CM

## 2013-09-28 DIAGNOSIS — N50812 Left testicular pain: Secondary | ICD-10-CM

## 2013-09-28 NOTE — Progress Notes (Signed)
  Subjective:    Patient ID: [name], male    DOB: 1933-02-19, 79 y.o.   MRN: [id number]  HPI Here for 7 days of mild swelling and discomfort in the left testicle. It has been feeling better for the past 2 days and today it doesn't really bother him at all. No urinary sx. No recent trauma but it did start to bother him immediately after a colonoscopy.    Review of Systems  Constitutional: Negative.   Gastrointestinal: Negative.   Genitourinary: Positive for testicular pain. Negative for dysuria, urgency, frequency, hematuria, flank pain, discharge, scrotal swelling and difficulty urinating.       Objective:   Physical Exam  Genitourinary:  Both testicles are normal with no swelling and no tenderness. No hernias felt          Assessment & Plan:  This was probably some testicular bruising from lying on the endoscopy table. He seems to be back to normal now. Recheck prn

## 2013-10-26 ENCOUNTER — Other Ambulatory Visit: Payer: Self-pay | Admitting: Family Medicine

## 2013-11-25 ENCOUNTER — Other Ambulatory Visit: Payer: Self-pay | Admitting: Family Medicine

## 2013-11-30 DIAGNOSIS — M545 Low back pain, unspecified: Secondary | ICD-10-CM | POA: Diagnosis not present

## 2013-11-30 DIAGNOSIS — M25559 Pain in unspecified hip: Secondary | ICD-10-CM | POA: Diagnosis not present

## 2013-12-08 DIAGNOSIS — I824Z9 Acute embolism and thrombosis of unspecified deep veins of unspecified distal lower extremity: Secondary | ICD-10-CM | POA: Diagnosis not present

## 2013-12-09 ENCOUNTER — Encounter: Payer: Self-pay | Admitting: Family Medicine

## 2013-12-09 ENCOUNTER — Ambulatory Visit (INDEPENDENT_AMBULATORY_CARE_PROVIDER_SITE_OTHER): Payer: Medicare Other | Admitting: Family Medicine

## 2013-12-09 VITALS — BP 146/84 | HR 91 | Temp 98.9°F | Wt 208.0 lb

## 2013-12-09 DIAGNOSIS — I82409 Acute embolism and thrombosis of unspecified deep veins of unspecified lower extremity: Secondary | ICD-10-CM | POA: Diagnosis not present

## 2013-12-09 MED ORDER — RIVAROXABAN 15 MG PO TABS: 15.0000 mg | ORAL_TABLET | Freq: Two times a day (BID) | ORAL | Status: DC

## 2013-12-09 NOTE — Progress Notes (Signed)
   Subjective:    Patient ID: [name], male    DOB: Dec 05, 1932, 80 y.o.   MRN: [id number]  HPI Here to discuss treatment for a new DVT in the right lower leg. He has a hx of recurrent DVTs in the legs and PE in 2012. He has felt fine with no leg pain, but he saw Dr. Renaldo Reel yesterday for a routine follow up visit and they performed venous dopplers of both legs. They found a new small DVT in the right lower leg. Dr. Renaldo Reel recommended him getting back on Xarelto and I agreed. We agreed that he should stay on lifelong permanent anticoagulation meds at this point. From what I can see in his chart he has not had a hypercoagulability workup as yet. Today he feels fine. He is wearing support stockings on both lower legs.    Review of Systems  Constitutional: Negative.   Respiratory: Negative.   Cardiovascular: Negative.        Objective:   Physical Exam  Constitutional: He appears well-developed and well-nourished. No distress.  Cardiovascular: Normal rate, regular rhythm, normal heart sounds and intact distal pulses.   Pulmonary/Chest: Effort normal and breath sounds normal.  Musculoskeletal: He exhibits no edema.  The right lower leg is not tender          Assessment & Plan:  Recurrent DVTs. We will put him on Xarelto 15 mg bid for 3 weeks, then go to 20 mg daily for maintenance. We will refer him to Hematology for a workup.

## 2013-12-09 NOTE — Progress Notes (Signed)
Pre visit review using our clinic review tool, if applicable. No additional management support is needed unless otherwise documented below in the visit note. 

## 2013-12-16 DIAGNOSIS — M25559 Pain in unspecified hip: Secondary | ICD-10-CM | POA: Diagnosis not present

## 2013-12-16 DIAGNOSIS — Z96659 Presence of unspecified artificial knee joint: Secondary | ICD-10-CM | POA: Diagnosis not present

## 2013-12-29 DIAGNOSIS — I824Z9 Acute embolism and thrombosis of unspecified deep veins of unspecified distal lower extremity: Secondary | ICD-10-CM | POA: Diagnosis not present

## 2014-01-15 ENCOUNTER — Telehealth: Payer: Self-pay | Admitting: Hematology & Oncology

## 2014-01-15 NOTE — Telephone Encounter (Signed)
Spoke w NEW PATIENT today to remind them of their appointment with Dr. Ennever. Also, advised them to bring all meds and insurance information. ° °

## 2014-01-18 ENCOUNTER — Ambulatory Visit (HOSPITAL_BASED_OUTPATIENT_CLINIC_OR_DEPARTMENT_OTHER): Payer: Medicare Other | Admitting: Hematology & Oncology

## 2014-01-18 ENCOUNTER — Encounter: Payer: Self-pay | Admitting: Hematology & Oncology

## 2014-01-18 ENCOUNTER — Ambulatory Visit: Payer: Medicare Other

## 2014-01-18 ENCOUNTER — Other Ambulatory Visit (HOSPITAL_BASED_OUTPATIENT_CLINIC_OR_DEPARTMENT_OTHER): Payer: Medicare Other | Admitting: Lab

## 2014-01-18 VITALS — BP 141/72 | HR 72 | Temp 98.0°F | Resp 18 | Ht 72.0 in | Wt 207.0 lb

## 2014-01-18 DIAGNOSIS — I82409 Acute embolism and thrombosis of unspecified deep veins of unspecified lower extremity: Secondary | ICD-10-CM

## 2014-01-18 LAB — CBC WITH DIFFERENTIAL (CANCER CENTER ONLY)
BASO#: 0 10*3/uL (ref 0.0–0.2)
BASO%: 0.3 % (ref 0.0–2.0)
EOS%: 0.6 % (ref 0.0–7.0)
Eosinophils Absolute: 0 10*3/uL (ref 0.0–0.5)
HEMATOCRIT: 36.8 % — AB (ref 38.7–49.9)
HGB: 12.4 g/dL — ABNORMAL LOW (ref 13.0–17.1)
LYMPH#: 1.2 10*3/uL (ref 0.9–3.3)
LYMPH%: 17.6 % (ref 14.0–48.0)
MCH: 33.8 pg — ABNORMAL HIGH (ref 28.0–33.4)
MCHC: 33.7 g/dL (ref 32.0–35.9)
MCV: 100 fL — ABNORMAL HIGH (ref 82–98)
MONO#: 0.9 10*3/uL (ref 0.1–0.9)
MONO%: 12.7 % (ref 0.0–13.0)
NEUT%: 68.8 % (ref 40.0–80.0)
NEUTROS ABS: 4.6 10*3/uL (ref 1.5–6.5)
PLATELETS: 205 10*3/uL (ref 145–400)
RBC: 3.67 10*6/uL — ABNORMAL LOW (ref 4.20–5.70)
RDW: 13.4 % (ref 11.1–15.7)
WBC: 6.7 10*3/uL (ref 4.0–10.0)

## 2014-01-19 NOTE — Progress Notes (Signed)
Referral MD  [phone]    Reason for Referral: recurrent DVT of the right leg  Chief Complaint  Patient presents with  . NEW PATIENT  : I have another blood clot in the right leg  HPI:  [name] is a very nice 80 year old gentleman. He is followed by Dr. Sarajane [name]. He does have multiple medical problems. He's been in good health overall.  He probably fell off a stool a couple years ago. He sustained an injury to his right leg. About 6 months later, he has swelling. This was in the right leg. He underwent a Doppler which showed a  Thrombus in the right leg extending up to the femoral vein. He was hospitalized. I can't find a record of this. He was put on Coumadin.  Followup Dopplers showed improvement of the thrombus but yet there was a chronic thrombus.  He was put on aspirin after 6 months of Coumadin.  He recently was seen by a vascular physician. He was having no symptoms. There is no swelling of the right leg. There is no pain. He did have a Doppler done. This reportedly showed a thrombus in the right lower leg.  He was then put back on Xarelto.   He was then referred to [place] to see about long-term anticoagulation andhe if there is any reason for the thrombus.  He feels okay. He has had no bleeding.he has had no weight loss or weight gain. He has had no rashes. He has had no change in bowel or bladder habits. He has had a colonoscopy within the past few years.      Past Medical History  Diagnosis Date  . GERD (gastroesophageal reflux disease)   . Allergy   . Normal cardiac stress test     10-29-08  . Diverticulosis   . ED (erectile dysfunction)   . BPH (benign prostatic hypertrophy)   . Osteoarthritis   . Headache(784.0)   . Personal history of colonic polyps   . Hypertension   . Other and unspecified hyperlipidemia   . Hypercalcemia     sees Dr [name]  . Pulmonary embolism     01-10-11  . DVT of lower extremity (deep venous thrombosis)     right leg, 01-10-11. sees  Dr. Linus [name]   . Neuromuscular disorder     sciatica  . Hyperparathyroidism   :  Past Surgical History  Procedure Laterality Date  . Knee arthroscopy      rt knee  . Joint replacement      rt knee replacement 2003 Dr [name]  . Basal cell carcinoma excision      removed face back Dr [name], had MOHS Dr [name]  . Tonsillectomy    . Hernia repair      Inguinal right Dr [name]  3-05  . Knee arthroscopy      left knee 2011 Dr [name]   . Colonoscopy  07-23-08    per Dr. Deatra [name], polyps, repeat in 5 yrs   . Vasectomy    . Total knee arthroplasty  12/08/2012    Procedure: TOTAL KNEE ARTHROPLASTY;  Surgeon: David Alf, MD;  Location: [facility];  Service: Orthopedics;  Laterality: Left;  . Endovenous ablation saphenous vein w/ laser Bilateral 05-28-13    per Dr. Renaldo [name]   :  Current outpatient prescriptions:atorvastatin (LIPITOR) 10 MG tablet, Take 10 mg by mouth daily before breakfast., Disp: , Rfl: ;  doxazosin (CARDURA) 4 MG tablet, Take 4 mg by mouth at bedtime., Disp: ,  Rfl: ;  finasteride (PROSCAR) 5 MG tablet, Take 5 mg by mouth daily. , Disp: , Rfl: ;  fluticasone (FLONASE) 50 MCG/ACT nasal spray, Place 2 sprays into the nose daily., Disp: , Rfl:  furosemide (LASIX) 40 MG tablet, Take 40 mg by mouth daily before breakfast., Disp: , Rfl: ;  naproxen (NAPROSYN) 500 MG tablet, Take 500 mg by mouth 2 (two) times daily with a meal. , Disp: , Rfl: ;  pantoprazole (PROTONIX) 40 MG tablet, Take 40 mg by mouth 2 (two) times daily. , Disp: , Rfl: ;  polyethylene glycol powder (GLYCOLAX/MIRALAX) powder, Take 17 g by mouth daily. , Disp: , Rfl:  Psyllium (METAMUCIL) 30.9 % POWD, Take 15 mLs by mouth daily. , Disp: , Rfl: ;  Rivaroxaban (XARELTO) 20 MG TABS tablet, Take 20 mg by mouth daily with supper., Disp: , Rfl: ;  silodosin (RAPAFLO) 8 MG CAPS capsule, Take 8 mg by mouth daily with breakfast., Disp: , Rfl:  No current facility-administered medications for this  visit. Facility-Administered Medications Ordered in Other Visits: bupivacaine liposome (EXPAREL) 1.3 % injection 266 mg, 20 mL, Infiltration, Once, David Alf, MD:  :  No Known Allergies:  Family History  Problem Relation Age of Onset  . Heart disease Mother   . Arthritis Other   . Cancer Other     Colon 1st degree relative <60  . Colon cancer Father   . Pancreatic cancer Neg Hx   . Stomach cancer Neg Hx   :  History   Social History  . Marital Status: Married    Spouse Name: N/A    Number of Children: N/A  . Years of Education: N/A   Occupational History  . retired    Social History Main Topics  . Smoking status: Former Smoker -- 0.50 packs/day for 7 years    Types: Cigarettes    Start date: 01/18/1955    Quit date: 11/26/1961  . Smokeless tobacco: Never Used     Comment: quit > 50 years ado  . Alcohol Use: 7.0 oz/week    14 drink(s) per week     Comment: 2 scotch drinks a day  . Drug Use: No  . Sexual Activity: Not on file   Other Topics Concern  . Not on file   Social History Narrative   Daily caffeine use  :  Pertinent items are noted in HPI.  Exam: @IPVITALS @ General appearance: alert. Lungs are clear. Cardiac exam regular rhythm. Abdomen soft. No hepatosplenomegaly. Back exam negative. Extremities shows no swelling in the right leg. No venous cord is noted. Left leg is unremarkable. Skin with no rashes. Neurological exam unremarkable. No lymphadenopathy is noted.  No ocular lesions are appreciated. There is no oral lesions.   Recent Labs  01/18/14 1328  WBC 6.7  HGB 12.4*  HCT 36.8*  PLT 205   No results found for this basename: NA, K, CL, CO2, GLUCOSE, BUN, CREATININE, CALCIUM,  in the last 72 hours  Blood smear review: none  Pathology:none  No results found.  Assessment and Plan: [name] is a very nice 80 year old gentleman. He has a recurrent DVT in the right leg. Initial blood clot occurred about 6 months after he sustained  some trauma to the right leg. He was put on Coumadin.he was on Coumadin forat the about 6 months. He had a chronic thrombus noted on a followup scan.  Am still not sure as to what was found with a recent Doppler. This was  done outside of the [facility] system so I'll have to try to this. I don't know if this was a superficial thrombus.  . Is now on Xarelto.  I did send off hypercoagulable studies on him. We'll would think these would be highly unlikely to be positive at his age.  It's hard to say whether or not he needs lifelong anticoagulation. I would hate to commit him to this. As always at risk of a bleeding the longer it he is on anticoagulation.  It was only phone talking to him and his wife.  Was I get the results back from his studies, that we will go to figure out what to get him back in what to do with anticoagulation.

## 2014-01-20 LAB — HYPERCOAGULABLE PANEL, COMPREHENSIVE
AntiThromb III Func: 105 % (ref 76–126)
Anticardiolipin IgA: 8 APL U/mL (ref <22)
Anticardiolipin IgG: 6 GPL U/mL (ref <23)
Anticardiolipin IgM: 12 MPL U/mL — ABNORMAL HIGH (ref <11)
BETA-2-GLYCOPROTEIN I IGA: 4 A Units (ref <20)
Beta-2 Glyco I IgG: 7 G Units (ref <20)
Beta-2-Glycoprotein I IgM: 24 M Units — ABNORMAL HIGH (ref <20)
DRVVT 1 1 MIX: 44.9 s — AB (ref <42.9)
DRVVT CONFIRMATION: 0.93 ratio (ref <1.11)
DRVVT: 57.2 secs — ABNORMAL HIGH (ref <42.9)
LUPUS ANTICOAGULANT: NOT DETECTED
PROTEIN C ACTIVITY: 200 % — AB (ref 75–133)
PROTEIN C, TOTAL: 173 % — AB (ref 72–160)
PROTEIN S ACTIVITY: 185 % — AB (ref 69–129)
PTT Lupus Anticoagulant: 39.9 secs (ref 28.0–43.0)
Protein S Total: 107 % (ref 60–150)

## 2014-01-20 LAB — D-DIMER, QUANTITATIVE (NOT AT ARMC): D DIMER QUANT: 0.28 ug{FEU}/mL (ref 0.00–0.48)

## 2014-01-22 ENCOUNTER — Other Ambulatory Visit: Payer: Self-pay | Admitting: Family Medicine

## 2014-01-26 ENCOUNTER — Other Ambulatory Visit: Payer: Self-pay | Admitting: Family Medicine

## 2014-02-02 ENCOUNTER — Other Ambulatory Visit: Payer: Self-pay | Admitting: Family Medicine

## 2014-02-03 ENCOUNTER — Telehealth: Payer: Self-pay | Admitting: Family Medicine

## 2014-02-03 NOTE — Telephone Encounter (Signed)
Pt saw dr Marin Olp on 2/22 and wants to know if you had a chance to go over his info? Pt has cpe 3/27. Pt states if you need him to come in sooner, he will.

## 2014-02-05 NOTE — Telephone Encounter (Signed)
Tell [name] that the test results are in the computer but I do not see any note about Dr. Antonieta Pert interpretation of these results. I cannot give advice based on these tests like he can. I suggest [name] contact Dr. Marin Olp again about his recommendations.

## 2014-02-08 NOTE — Telephone Encounter (Signed)
I spoke with pt and he is following recommendations and will discuss with [place] on his next appointment.

## 2014-02-15 ENCOUNTER — Encounter: Payer: Self-pay | Admitting: Family Medicine

## 2014-02-16 ENCOUNTER — Other Ambulatory Visit: Payer: Self-pay | Admitting: Family Medicine

## 2014-02-19 ENCOUNTER — Ambulatory Visit (INDEPENDENT_AMBULATORY_CARE_PROVIDER_SITE_OTHER): Payer: Medicare Other | Admitting: Family Medicine

## 2014-02-19 ENCOUNTER — Encounter: Payer: Self-pay | Admitting: Family Medicine

## 2014-02-19 VITALS — BP 130/80 | HR 110 | Temp 99.1°F | Ht 70.25 in | Wt 204.0 lb

## 2014-02-19 DIAGNOSIS — N400 Enlarged prostate without lower urinary tract symptoms: Secondary | ICD-10-CM | POA: Diagnosis not present

## 2014-02-19 DIAGNOSIS — M1990 Unspecified osteoarthritis, unspecified site: Secondary | ICD-10-CM

## 2014-02-19 DIAGNOSIS — I2699 Other pulmonary embolism without acute cor pulmonale: Secondary | ICD-10-CM | POA: Diagnosis not present

## 2014-02-19 DIAGNOSIS — E785 Hyperlipidemia, unspecified: Secondary | ICD-10-CM

## 2014-02-19 DIAGNOSIS — I10 Essential (primary) hypertension: Secondary | ICD-10-CM

## 2014-02-19 LAB — CBC WITH DIFFERENTIAL/PLATELET
Basophils Absolute: 0 10*3/uL (ref 0.0–0.1)
Basophils Relative: 0.4 % (ref 0.0–3.0)
EOS ABS: 0.1 10*3/uL (ref 0.0–0.7)
Eosinophils Relative: 1.4 % (ref 0.0–5.0)
HEMATOCRIT: 38.2 % — AB (ref 39.0–52.0)
HEMOGLOBIN: 12.9 g/dL — AB (ref 13.0–17.0)
LYMPHS ABS: 1.3 10*3/uL (ref 0.7–4.0)
Lymphocytes Relative: 19.6 % (ref 12.0–46.0)
MCHC: 33.9 g/dL (ref 30.0–36.0)
MCV: 101.9 fl — ABNORMAL HIGH (ref 78.0–100.0)
MONOS PCT: 12.3 % — AB (ref 3.0–12.0)
Monocytes Absolute: 0.8 10*3/uL (ref 0.1–1.0)
NEUTROS ABS: 4.3 10*3/uL (ref 1.4–7.7)
Neutrophils Relative %: 66.3 % (ref 43.0–77.0)
Platelets: 239 10*3/uL (ref 150.0–400.0)
RBC: 3.75 Mil/uL — AB (ref 4.22–5.81)
RDW: 14.1 % (ref 11.5–14.6)
WBC: 6.4 10*3/uL (ref 4.5–10.5)

## 2014-02-19 LAB — POCT URINALYSIS DIPSTICK
BILIRUBIN UA: NEGATIVE
Glucose, UA: NEGATIVE
KETONES UA: NEGATIVE
LEUKOCYTES UA: NEGATIVE
Nitrite, UA: NEGATIVE
PH UA: 8.5
RBC UA: NEGATIVE
SPEC GRAV UA: 1.015
Urobilinogen, UA: 0.2

## 2014-02-19 LAB — BASIC METABOLIC PANEL
BUN: 16 mg/dL (ref 6–23)
CHLORIDE: 102 meq/L (ref 96–112)
CO2: 26 meq/L (ref 19–32)
Calcium: 11.1 mg/dL — ABNORMAL HIGH (ref 8.4–10.5)
Creatinine, Ser: 1 mg/dL (ref 0.4–1.5)
GFR: 80.46 mL/min (ref >60.00)
Glucose, Bld: 96 mg/dL (ref 70–99)
Potassium: 4 mEq/L (ref 3.5–5.1)
SODIUM: 140 meq/L (ref 135–145)

## 2014-02-19 LAB — LIPID PANEL
Cholesterol: 189 mg/dL (ref 0–200)
HDL: 88.2 mg/dL (ref >39.00)
LDL CALC: 95 mg/dL (ref 0–99)
TRIGLYCERIDES: 28 mg/dL (ref 0.0–149.0)
Total CHOL/HDL Ratio: 2
VLDL: 5.6 mg/dL (ref 0.0–40.0)

## 2014-02-19 LAB — HEPATIC FUNCTION PANEL
ALK PHOS: 49 U/L (ref 39–117)
ALT: 19 U/L (ref 0–53)
AST: 20 U/L (ref 0–37)
Albumin: 4.2 g/dL (ref 3.5–5.2)
BILIRUBIN DIRECT: 0.2 mg/dL (ref 0.0–0.3)
Total Bilirubin: 1.1 mg/dL (ref 0.3–1.2)
Total Protein: 6.8 g/dL (ref 6.0–8.3)

## 2014-02-19 LAB — PSA: PSA: 2.03 ng/mL (ref 0.10–4.00)

## 2014-02-19 LAB — TSH: TSH: 2.34 u[IU]/mL (ref 0.35–5.50)

## 2014-02-19 NOTE — Progress Notes (Signed)
Pre visit review using our clinic review tool, if applicable. No additional management support is needed unless otherwise documented below in the visit note. 

## 2014-02-19 NOTE — Progress Notes (Signed)
   Subjective:    Patient ID: [name], male    DOB: 18-Nov-1933, 80 y.o.   MRN: [id number]  HPI 80 yr old male for a cpx. He feels well. As soon as the weather warms up he plans to play golf again.    Review of Systems  Constitutional: Negative.   HENT: Negative.   Eyes: Negative.   Respiratory: Negative.   Cardiovascular: Negative.   Gastrointestinal: Negative.   Genitourinary: Negative.   Musculoskeletal: Negative.   Skin: Negative.   Neurological: Negative.   Psychiatric/Behavioral: Negative.        Objective:   Physical Exam  Constitutional: He is oriented to person, place, and time. He appears well-developed and well-nourished. No distress.  HENT:  Head: Normocephalic and atraumatic.  Right Ear: External ear normal.  Left Ear: External ear normal.  Nose: Nose normal.  Mouth/Throat: Oropharynx is clear and moist. No oropharyngeal exudate.  Eyes: Conjunctivae and EOM are normal. Pupils are equal, round, and reactive to light. Right eye exhibits no discharge. Left eye exhibits no discharge. No scleral icterus.  Neck: Neck supple. No JVD present. No tracheal deviation present. No thyromegaly present.  Cardiovascular: Normal rate, regular rhythm, normal heart sounds and intact distal pulses.  Exam reveals no gallop and no friction rub.   No murmur heard. EKG shows stable RBBB with left axis bifascicular block   Pulmonary/Chest: Effort normal and breath sounds normal. No respiratory distress. He has no wheezes. He has no rales. He exhibits no tenderness.  Abdominal: Soft. Bowel sounds are normal. He exhibits no distension and no mass. There is no tenderness. There is no rebound and no guarding.  Genitourinary: Rectum normal, prostate normal and penis normal. Guaiac negative stool. No penile tenderness.  Musculoskeletal: Normal range of motion. He exhibits no edema and no tenderness.  Lymphadenopathy:    He has no cervical adenopathy.  Neurological: He is alert and  oriented to person, place, and time. He has normal reflexes. No cranial nerve deficit. He exhibits normal muscle tone. Coordination normal.  Skin: Skin is warm and dry. No rash noted. He is not diaphoretic. No erythema. No pallor.  Psychiatric: He has a normal mood and affect. His behavior is normal. Judgment and thought content normal.          Assessment & Plan:  Well exam. Get fasting labs

## 2014-02-20 ENCOUNTER — Telehealth: Payer: Self-pay | Admitting: Family Medicine

## 2014-02-20 NOTE — Telephone Encounter (Signed)
Relevant patient education assigned to patient using Emmi. ° °

## 2014-02-21 ENCOUNTER — Other Ambulatory Visit: Payer: Self-pay | Admitting: Family Medicine

## 2014-03-08 ENCOUNTER — Other Ambulatory Visit: Payer: Self-pay | Admitting: Dermatology

## 2014-03-08 DIAGNOSIS — L821 Other seborrheic keratosis: Secondary | ICD-10-CM | POA: Diagnosis not present

## 2014-03-08 DIAGNOSIS — C44611 Basal cell carcinoma of skin of unspecified upper limb, including shoulder: Secondary | ICD-10-CM | POA: Diagnosis not present

## 2014-03-08 DIAGNOSIS — D1801 Hemangioma of skin and subcutaneous tissue: Secondary | ICD-10-CM | POA: Diagnosis not present

## 2014-03-08 DIAGNOSIS — L570 Actinic keratosis: Secondary | ICD-10-CM | POA: Diagnosis not present

## 2014-03-08 DIAGNOSIS — Z85828 Personal history of other malignant neoplasm of skin: Secondary | ICD-10-CM | POA: Diagnosis not present

## 2014-03-08 DIAGNOSIS — L739 Follicular disorder, unspecified: Secondary | ICD-10-CM | POA: Diagnosis not present

## 2014-03-08 DIAGNOSIS — C44211 Basal cell carcinoma of skin of unspecified ear and external auricular canal: Secondary | ICD-10-CM | POA: Diagnosis not present

## 2014-04-05 DIAGNOSIS — C44211 Basal cell carcinoma of skin of unspecified ear and external auricular canal: Secondary | ICD-10-CM | POA: Diagnosis not present

## 2014-04-05 DIAGNOSIS — Z85828 Personal history of other malignant neoplasm of skin: Secondary | ICD-10-CM | POA: Diagnosis not present

## 2014-04-16 DIAGNOSIS — N400 Enlarged prostate without lower urinary tract symptoms: Secondary | ICD-10-CM | POA: Diagnosis not present

## 2014-04-16 DIAGNOSIS — R39198 Other difficulties with micturition: Secondary | ICD-10-CM | POA: Diagnosis not present

## 2014-04-16 DIAGNOSIS — R350 Frequency of micturition: Secondary | ICD-10-CM | POA: Diagnosis not present

## 2014-04-29 DIAGNOSIS — I87029 Postthrombotic syndrome with inflammation of unspecified lower extremity: Secondary | ICD-10-CM | POA: Diagnosis not present

## 2014-04-29 DIAGNOSIS — I824Z9 Acute embolism and thrombosis of unspecified deep veins of unspecified distal lower extremity: Secondary | ICD-10-CM | POA: Diagnosis not present

## 2014-04-30 ENCOUNTER — Other Ambulatory Visit: Payer: Self-pay | Admitting: Family Medicine

## 2014-06-29 ENCOUNTER — Ambulatory Visit (INDEPENDENT_AMBULATORY_CARE_PROVIDER_SITE_OTHER): Payer: Medicare Other | Admitting: Family Medicine

## 2014-06-29 ENCOUNTER — Encounter: Payer: Self-pay | Admitting: Family Medicine

## 2014-06-29 VITALS — BP 131/89 | HR 79 | Temp 98.9°F | Ht 70.25 in | Wt 210.0 lb

## 2014-06-29 DIAGNOSIS — N508 Other specified disorders of male genital organs: Secondary | ICD-10-CM | POA: Diagnosis not present

## 2014-06-29 DIAGNOSIS — H9313 Tinnitus, bilateral: Secondary | ICD-10-CM

## 2014-06-29 DIAGNOSIS — H9319 Tinnitus, unspecified ear: Secondary | ICD-10-CM

## 2014-06-29 DIAGNOSIS — N5089 Other specified disorders of the male genital organs: Secondary | ICD-10-CM

## 2014-06-29 MED ORDER — FLUTICASONE PROPIONATE 50 MCG/ACT NA SUSP: 2.0000 | Freq: Every day | NASAL | Status: DC

## 2014-06-29 NOTE — Progress Notes (Signed)
Pre visit review using our clinic review tool, if applicable. No additional management support is needed unless otherwise documented below in the visit note. 

## 2014-06-29 NOTE — Progress Notes (Signed)
   Subjective:    Patient ID: [name], male    DOB: [birth year], 80 y.o.   MRN: [id number]  HPI Here for 2 things. First he has had a lot of ringing in both ears for the past 3 days. No recent exposure to loud noises, no hearing changes, no dizziness. No headaches. The ringing has improved daily and today he can hardly notice it. Also he asks me to check his left testicle. We saw him for pain in this area last winter and his exam was fairly unremarkable. He no longer has any pain but he thinks the lump in the scrotum may be getting larger.    Review of Systems  Constitutional: Negative.   HENT: Positive for congestion, sinus pressure and tinnitus. Negative for ear discharge, ear pain, hearing loss, nosebleeds, postnasal drip, rhinorrhea and sore throat.   Genitourinary: Positive for scrotal swelling. Negative for dysuria, urgency, frequency, hematuria, flank pain, discharge, penile swelling, difficulty urinating, genital sores, penile pain and testicular pain.  Neurological: Negative.        Objective:   Physical Exam  Constitutional: He is oriented to person, place, and time. He appears well-developed and well-nourished.  HENT:  Head: Normocephalic and atraumatic.  Right Ear: External ear normal.  Left Ear: External ear normal.  Nose: Nose normal.  Mouth/Throat: Oropharynx is clear and moist.  Eyes: Conjunctivae and EOM are normal. Pupils are equal, round, and reactive to light.  Neck: Neck supple. No thyromegaly present.  Genitourinary:  There is a very large firm non-reducible mass in the left scrotum superior to the testicle. This does not retract with straining or coughing. It is not tender   Lymphadenopathy:    He has no cervical adenopathy.  Neurological: He is alert and oriented to person, place, and time. He has normal reflexes. No cranial nerve deficit. He exhibits normal muscle tone. Coordination normal.          Assessment & Plan:  His tinnitus may result  from some sinus congestion. Resume daily Flonase sprays. Recheck prn. We will refer to Urology to evaluate the scrotal mass.

## 2014-07-08 DIAGNOSIS — N400 Enlarged prostate without lower urinary tract symptoms: Secondary | ICD-10-CM | POA: Diagnosis not present

## 2014-07-08 DIAGNOSIS — R350 Frequency of micturition: Secondary | ICD-10-CM | POA: Diagnosis not present

## 2014-07-18 ENCOUNTER — Other Ambulatory Visit: Payer: Self-pay | Admitting: Family Medicine

## 2014-07-21 ENCOUNTER — Other Ambulatory Visit: Payer: Self-pay | Admitting: Family Medicine

## 2014-07-26 ENCOUNTER — Other Ambulatory Visit: Payer: Self-pay | Admitting: Family Medicine

## 2014-07-27 NOTE — Telephone Encounter (Signed)
Can we refill this? 

## 2014-07-29 DIAGNOSIS — I824Z9 Acute embolism and thrombosis of unspecified deep veins of unspecified distal lower extremity: Secondary | ICD-10-CM | POA: Diagnosis not present

## 2014-07-29 DIAGNOSIS — I824Y9 Acute embolism and thrombosis of unspecified deep veins of unspecified proximal lower extremity: Secondary | ICD-10-CM | POA: Diagnosis not present

## 2014-08-06 DIAGNOSIS — H2510 Age-related nuclear cataract, unspecified eye: Secondary | ICD-10-CM | POA: Diagnosis not present

## 2014-08-30 ENCOUNTER — Encounter: Payer: Self-pay | Admitting: Gastroenterology

## 2014-10-03 IMAGING — CR DG CHEST 2V
2 series · 2 of 2 positions shown · non-contrast
Comparison: 01/11/2010.

CLINICAL DATA: Preoperative evaluation.

CHEST - 2 VIEW

[w chest pa]
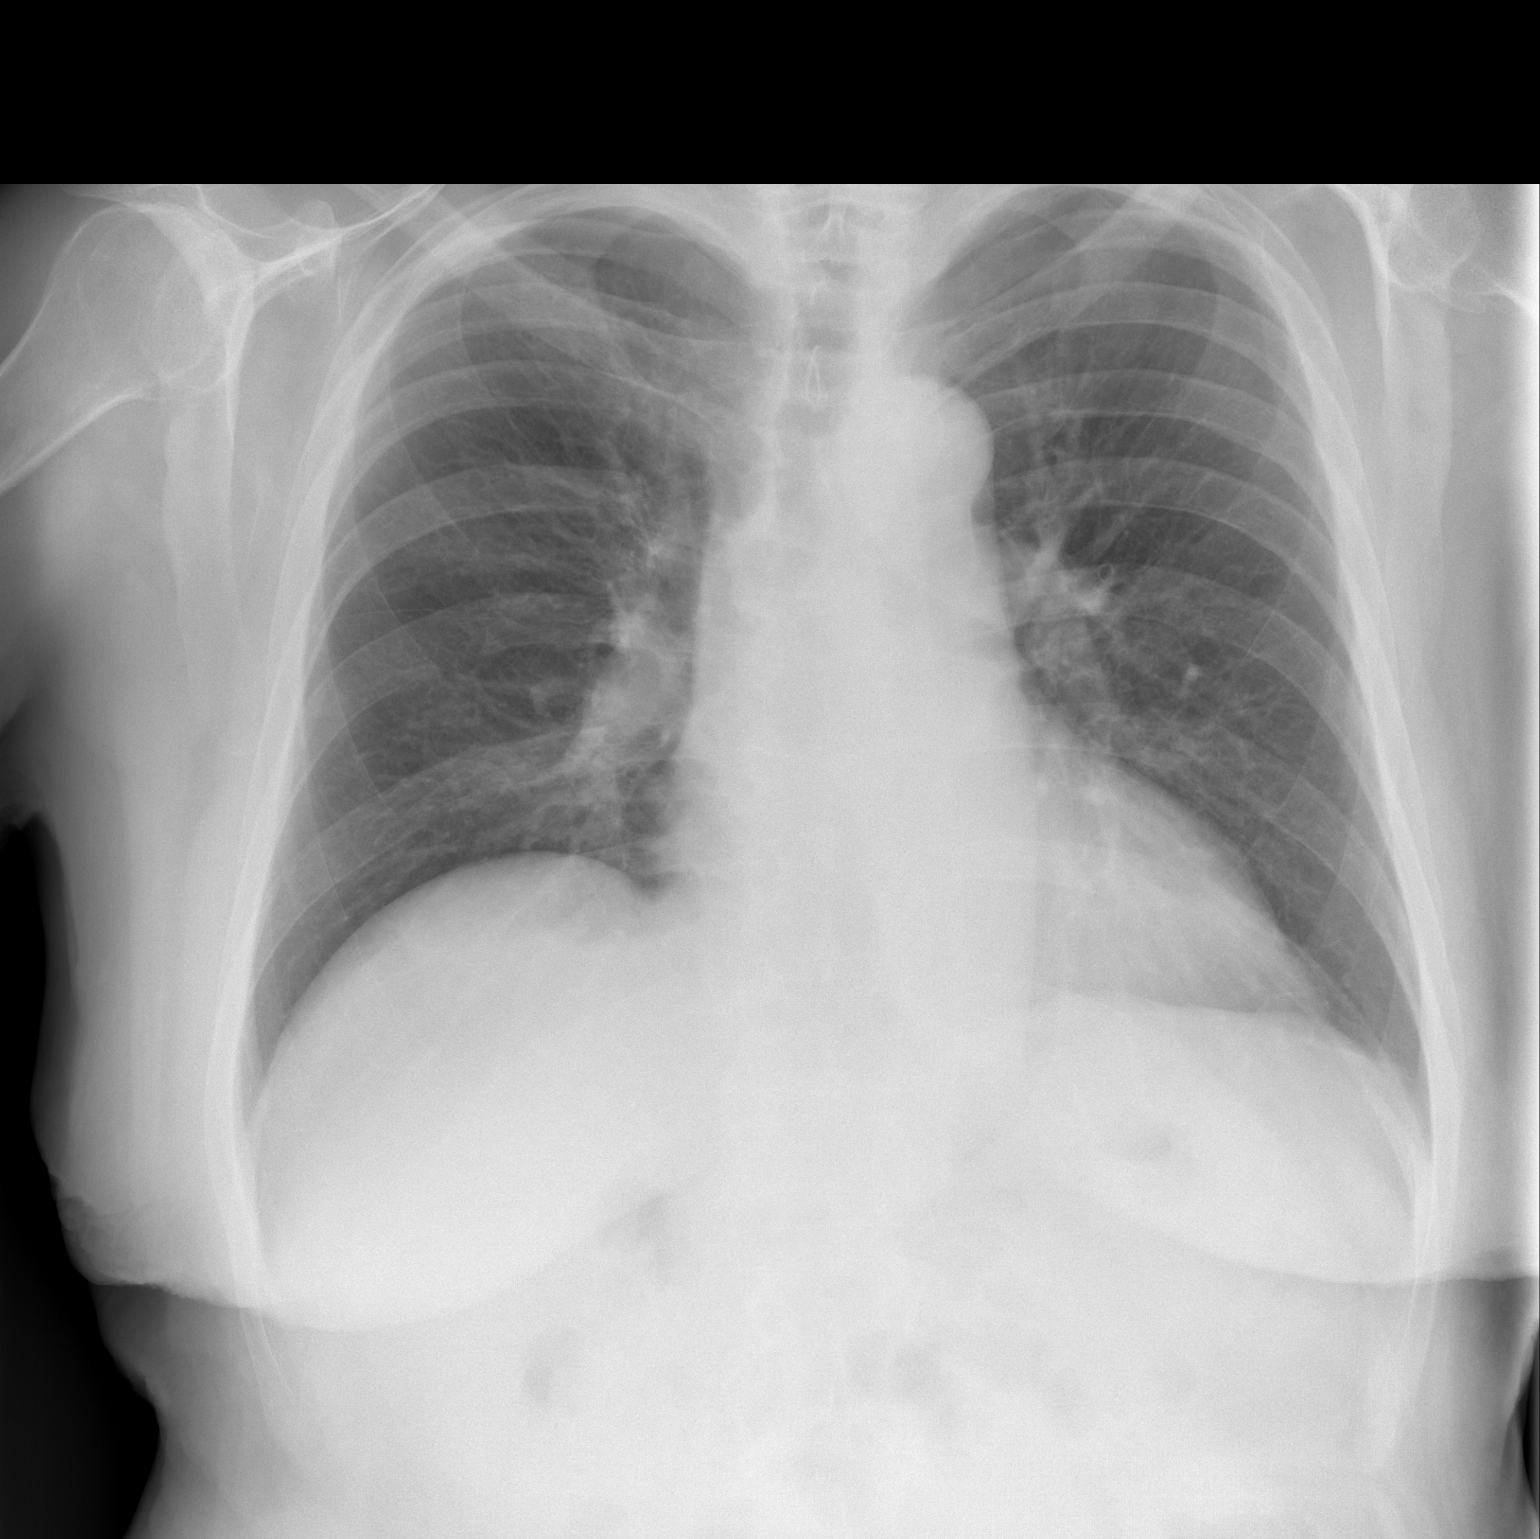

[w chest lat]
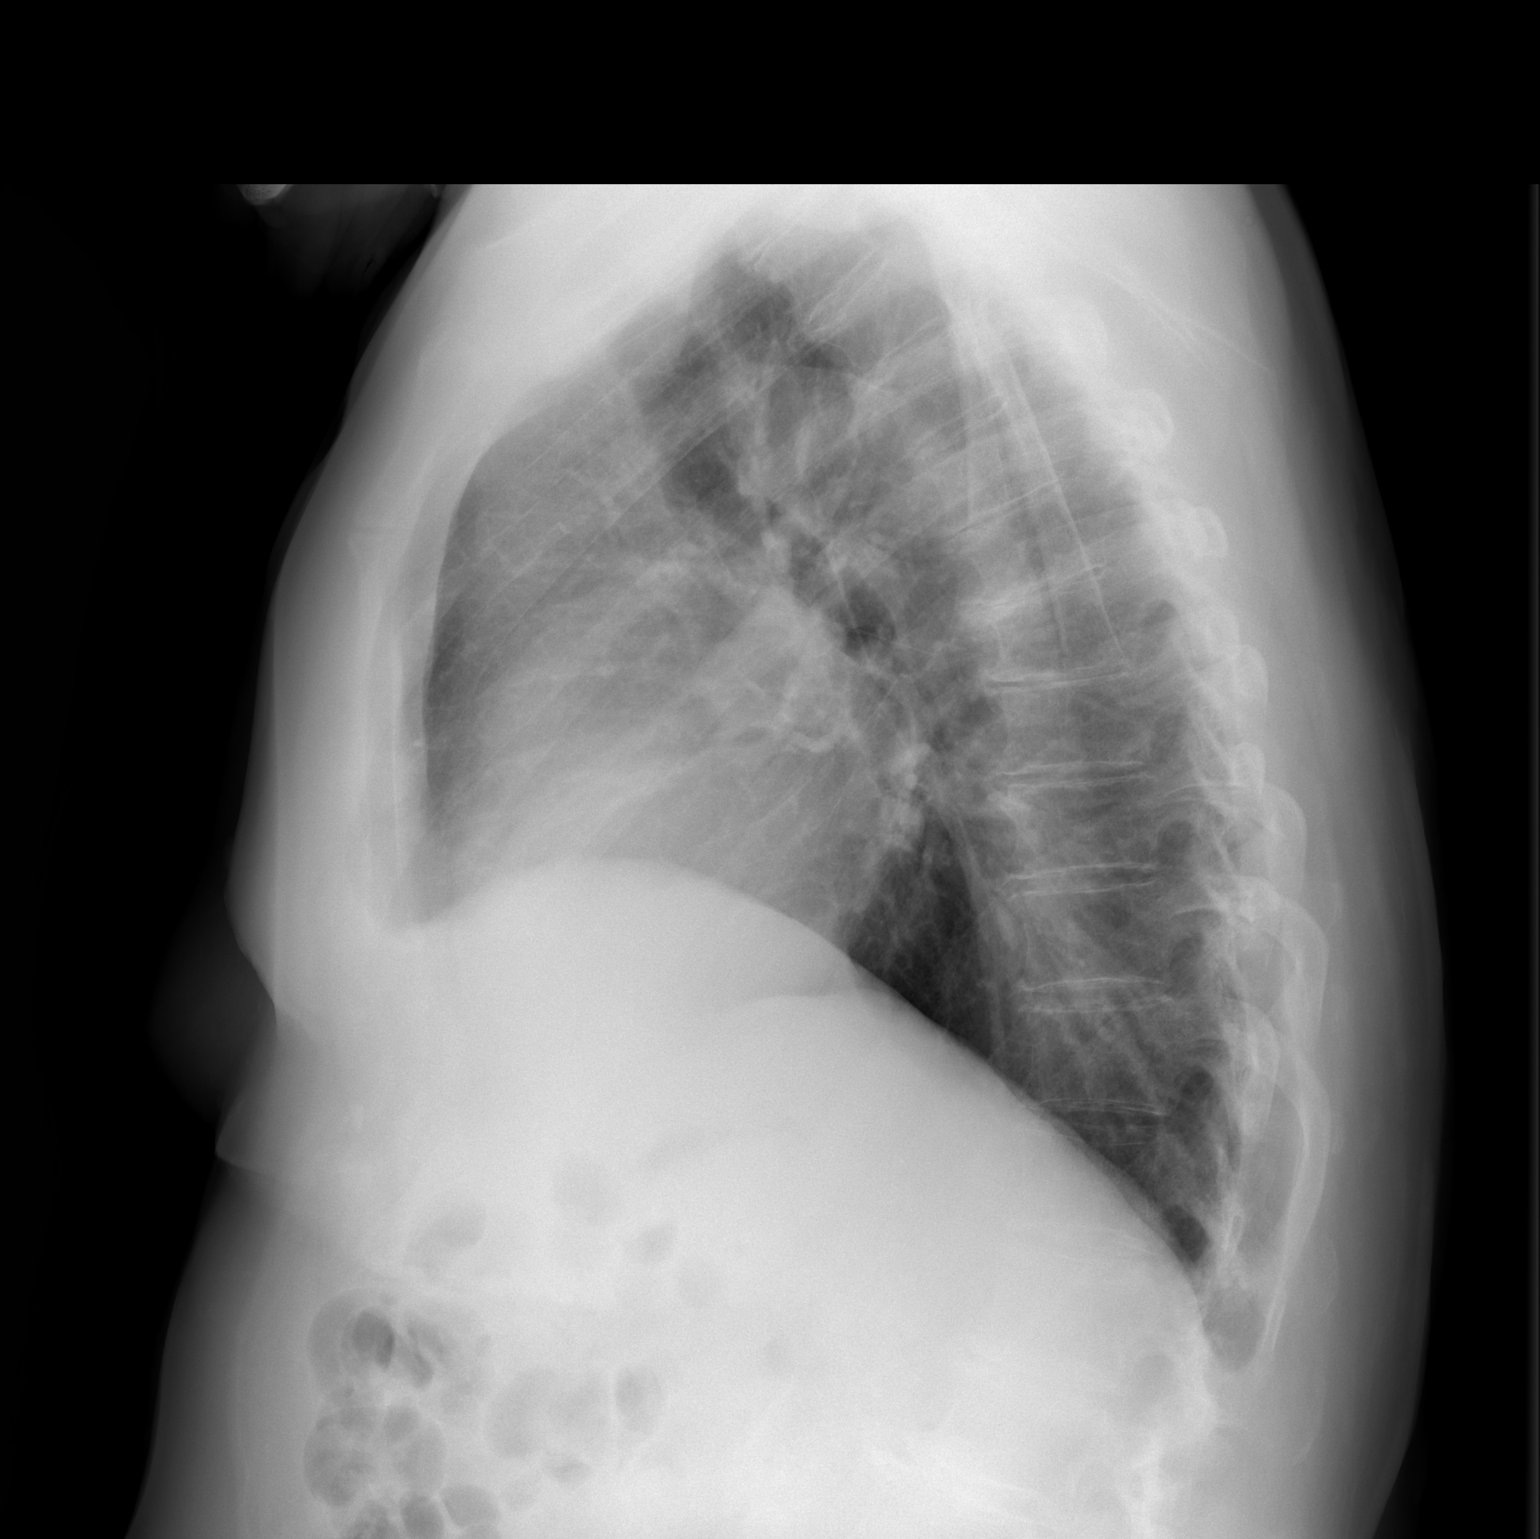

[2 of 2 positions shown; findings below may reference images not displayed]

FINDINGS: There is borderline enlargement of the cardiac
silhouette.  There is ectasia of thoracic aorta with mild
descending aortic tortuosity.  Mediastinal and hilar contours
appear stable.  No pulmonary infiltrates or masses were evident. No
pleural abnormality is evident.  Changes of degenerative disc
disease and degenerative spondylosis are present.  Minimal
compression fracture of mid thoracic vertebral body is unchanged.
Slightly osteopenic appearance of bones.
IMPRESSION: Borderline cardiac silhouette enlargement.  Ectasia of thoracic
aorta.  No acute cardiopulmonary or pleural abnormalities are
evident.  Chronic bony findings are detailed above.

## 2014-10-14 ENCOUNTER — Other Ambulatory Visit: Payer: Self-pay | Admitting: Family Medicine

## 2014-10-19 ENCOUNTER — Other Ambulatory Visit: Payer: Self-pay | Admitting: Family Medicine

## 2014-10-25 ENCOUNTER — Other Ambulatory Visit: Payer: Self-pay | Admitting: Dermatology

## 2014-10-25 DIAGNOSIS — D485 Neoplasm of uncertain behavior of skin: Secondary | ICD-10-CM | POA: Diagnosis not present

## 2014-10-25 DIAGNOSIS — C44319 Basal cell carcinoma of skin of other parts of face: Secondary | ICD-10-CM | POA: Diagnosis not present

## 2014-10-25 DIAGNOSIS — Z85828 Personal history of other malignant neoplasm of skin: Secondary | ICD-10-CM | POA: Diagnosis not present

## 2014-10-25 DIAGNOSIS — L738 Other specified follicular disorders: Secondary | ICD-10-CM | POA: Diagnosis not present

## 2014-10-25 DIAGNOSIS — L821 Other seborrheic keratosis: Secondary | ICD-10-CM | POA: Diagnosis not present

## 2014-10-25 DIAGNOSIS — L814 Other melanin hyperpigmentation: Secondary | ICD-10-CM | POA: Diagnosis not present

## 2014-10-25 DIAGNOSIS — L570 Actinic keratosis: Secondary | ICD-10-CM | POA: Diagnosis not present

## 2014-11-15 ENCOUNTER — Ambulatory Visit (INDEPENDENT_AMBULATORY_CARE_PROVIDER_SITE_OTHER): Payer: Medicare Other

## 2014-11-15 ENCOUNTER — Ambulatory Visit: Payer: Medicare Other

## 2014-11-15 DIAGNOSIS — Z23 Encounter for immunization: Secondary | ICD-10-CM

## 2014-12-07 DIAGNOSIS — I824Z3 Acute embolism and thrombosis of unspecified deep veins of distal lower extremity, bilateral: Secondary | ICD-10-CM | POA: Diagnosis not present

## 2015-01-05 DIAGNOSIS — H00012 Hordeolum externum right lower eyelid: Secondary | ICD-10-CM | POA: Diagnosis not present

## 2015-01-25 ENCOUNTER — Encounter: Payer: Self-pay | Admitting: Podiatry

## 2015-01-25 ENCOUNTER — Ambulatory Visit (INDEPENDENT_AMBULATORY_CARE_PROVIDER_SITE_OTHER): Payer: Medicare Other | Admitting: Podiatry

## 2015-01-25 VITALS — BP 101/60 | HR 97 | Resp 18

## 2015-01-25 DIAGNOSIS — M2040 Other hammer toe(s) (acquired), unspecified foot: Secondary | ICD-10-CM

## 2015-01-25 DIAGNOSIS — L600 Ingrowing nail: Secondary | ICD-10-CM

## 2015-01-25 NOTE — Progress Notes (Signed)
Subjective:     Patient ID: [name], male   DOB: 10-08-1933, 81 y.o.   MRN: [id number]  HPI patient presents with a painful right big toe lateral border that he has trouble trimming himself has tried to soak and has trouble wearing shoe gear with   Review of Systems  All other systems reviewed and are negative.      Objective:   Physical Exam  Constitutional: He is oriented to person, place, and time.  Cardiovascular: Intact distal pulses.   Musculoskeletal: Normal range of motion.  Neurological: He is oriented to person, place, and time.  Skin: Skin is warm.  Nursing note and vitals reviewed.  neurovascular status intact with muscle strength adequate and range of motion subtalar midtarsal joint within normal limits. Patient's noted to have an incurvated right hallux lateral border with pain when palpated and distal redness with no current drainage noted. Patient does have structural digital deformities of the lesser toes and mild structural bunion deformity left noted     Assessment:     Painful ingrown toenail deformity right hallux and structural deformity of both feet of long-term duration    Plan:     H&P and conditions discussed reviewed. Patient wants ingrown toenail corrected and I explained procedure and risk. At this time I infiltrated the right hallux 60 mg Xylocaine Marcaine mixture remove the lateral border exposed matrix and applied phenol 3 applications 30 seconds followed by alcohol lavaged and sterile dressing. Gave instructions on soaks and reappoint

## 2015-01-25 NOTE — Patient Instructions (Signed)

## 2015-01-27 ENCOUNTER — Ambulatory Visit: Payer: PRIVATE HEALTH INSURANCE | Admitting: Podiatry

## 2015-04-12 ENCOUNTER — Other Ambulatory Visit: Payer: Self-pay | Admitting: Family Medicine

## 2015-04-19 DIAGNOSIS — R350 Frequency of micturition: Secondary | ICD-10-CM | POA: Diagnosis not present

## 2015-04-19 DIAGNOSIS — R3912 Poor urinary stream: Secondary | ICD-10-CM | POA: Diagnosis not present

## 2015-04-29 DIAGNOSIS — C44719 Basal cell carcinoma of skin of left lower limb, including hip: Secondary | ICD-10-CM | POA: Diagnosis not present

## 2015-04-29 DIAGNOSIS — D225 Melanocytic nevi of trunk: Secondary | ICD-10-CM | POA: Diagnosis not present

## 2015-04-29 DIAGNOSIS — L821 Other seborrheic keratosis: Secondary | ICD-10-CM | POA: Diagnosis not present

## 2015-04-29 DIAGNOSIS — C44519 Basal cell carcinoma of skin of other part of trunk: Secondary | ICD-10-CM | POA: Diagnosis not present

## 2015-04-29 DIAGNOSIS — Z85828 Personal history of other malignant neoplasm of skin: Secondary | ICD-10-CM | POA: Diagnosis not present

## 2015-04-29 DIAGNOSIS — L570 Actinic keratosis: Secondary | ICD-10-CM | POA: Diagnosis not present

## 2015-05-23 ENCOUNTER — Other Ambulatory Visit: Payer: Self-pay

## 2015-06-27 ENCOUNTER — Other Ambulatory Visit: Payer: Self-pay | Admitting: Family Medicine

## 2015-07-12 ENCOUNTER — Other Ambulatory Visit: Payer: Self-pay | Admitting: Family Medicine

## 2015-08-08 DIAGNOSIS — H2513 Age-related nuclear cataract, bilateral: Secondary | ICD-10-CM | POA: Diagnosis not present

## 2015-08-30 ENCOUNTER — Ambulatory Visit (INDEPENDENT_AMBULATORY_CARE_PROVIDER_SITE_OTHER): Payer: Medicare Other

## 2015-08-30 DIAGNOSIS — Z23 Encounter for immunization: Secondary | ICD-10-CM | POA: Diagnosis not present

## 2015-09-27 DIAGNOSIS — H2513 Age-related nuclear cataract, bilateral: Secondary | ICD-10-CM | POA: Diagnosis not present

## 2015-10-04 DIAGNOSIS — H1852 Epithelial (juvenile) corneal dystrophy: Secondary | ICD-10-CM | POA: Diagnosis not present

## 2015-10-04 DIAGNOSIS — H25813 Combined forms of age-related cataract, bilateral: Secondary | ICD-10-CM | POA: Diagnosis not present

## 2015-10-05 DIAGNOSIS — H1852 Epithelial (juvenile) corneal dystrophy: Secondary | ICD-10-CM | POA: Diagnosis not present

## 2015-10-09 ENCOUNTER — Other Ambulatory Visit: Payer: Self-pay | Admitting: Family Medicine

## 2015-10-14 ENCOUNTER — Other Ambulatory Visit: Payer: Self-pay | Admitting: Family Medicine

## 2015-11-07 DIAGNOSIS — I872 Venous insufficiency (chronic) (peripheral): Secondary | ICD-10-CM | POA: Diagnosis not present

## 2015-11-07 DIAGNOSIS — I824Z3 Acute embolism and thrombosis of unspecified deep veins of distal lower extremity, bilateral: Secondary | ICD-10-CM | POA: Diagnosis not present

## 2015-11-10 ENCOUNTER — Other Ambulatory Visit: Payer: Self-pay | Admitting: Family Medicine

## 2015-11-23 ENCOUNTER — Encounter: Payer: Self-pay | Admitting: *Deleted

## 2015-11-24 ENCOUNTER — Other Ambulatory Visit: Payer: Self-pay | Admitting: Family Medicine

## 2015-12-02 ENCOUNTER — Ambulatory Visit (INDEPENDENT_AMBULATORY_CARE_PROVIDER_SITE_OTHER): Payer: Medicare Other | Admitting: Family Medicine

## 2015-12-02 ENCOUNTER — Encounter: Payer: Self-pay | Admitting: Family Medicine

## 2015-12-02 VITALS — BP 127/89 | Temp 98.9°F | Ht 70.25 in | Wt 200.0 lb

## 2015-12-02 DIAGNOSIS — I10 Essential (primary) hypertension: Secondary | ICD-10-CM | POA: Diagnosis not present

## 2015-12-02 DIAGNOSIS — Z23 Encounter for immunization: Secondary | ICD-10-CM

## 2015-12-02 DIAGNOSIS — E785 Hyperlipidemia, unspecified: Secondary | ICD-10-CM

## 2015-12-02 DIAGNOSIS — N401 Enlarged prostate with lower urinary tract symptoms: Secondary | ICD-10-CM | POA: Diagnosis not present

## 2015-12-02 DIAGNOSIS — N138 Other obstructive and reflux uropathy: Secondary | ICD-10-CM

## 2015-12-02 DIAGNOSIS — R413 Other amnesia: Secondary | ICD-10-CM

## 2015-12-02 DIAGNOSIS — I82509 Chronic embolism and thrombosis of unspecified deep veins of unspecified lower extremity: Secondary | ICD-10-CM | POA: Diagnosis not present

## 2015-12-02 LAB — CBC WITH DIFFERENTIAL/PLATELET
BASOS ABS: 0 10*3/uL (ref 0.0–0.1)
Basophils Relative: 0.5 % (ref 0.0–3.0)
EOS PCT: 0.5 % (ref 0.0–5.0)
Eosinophils Absolute: 0 10*3/uL (ref 0.0–0.7)
HEMATOCRIT: 39.7 % (ref 39.0–52.0)
HEMOGLOBIN: 13.2 g/dL (ref 13.0–17.0)
LYMPHS ABS: 1.1 10*3/uL (ref 0.7–4.0)
LYMPHS PCT: 17.1 % (ref 12.0–46.0)
MCHC: 33.1 g/dL (ref 30.0–36.0)
MCV: 103.4 fl — AB (ref 78.0–100.0)
MONOS PCT: 12.3 % — AB (ref 3.0–12.0)
Monocytes Absolute: 0.8 10*3/uL (ref 0.1–1.0)
NEUTROS PCT: 69.6 % (ref 43.0–77.0)
Neutro Abs: 4.6 10*3/uL (ref 1.4–7.7)
Platelets: 245 10*3/uL (ref 150.0–400.0)
RBC: 3.84 Mil/uL — AB (ref 4.22–5.81)
RDW: 14.3 % (ref 11.5–15.5)
WBC: 6.7 10*3/uL (ref 4.0–10.5)

## 2015-12-02 LAB — HEPATIC FUNCTION PANEL
ALBUMIN: 4.2 g/dL (ref 3.5–5.2)
ALK PHOS: 49 U/L (ref 39–117)
ALT: 26 U/L (ref 0–53)
AST: 24 U/L (ref 0–37)
BILIRUBIN TOTAL: 1.3 mg/dL — AB (ref 0.2–1.2)
Bilirubin, Direct: 0.3 mg/dL (ref 0.0–0.3)
Total Protein: 6.5 g/dL (ref 6.0–8.3)

## 2015-12-02 LAB — LIPID PANEL
CHOL/HDL RATIO: 2
Cholesterol: 188 mg/dL (ref 0–200)
HDL: 90.6 mg/dL (ref >39.00)
LDL CALC: 90 mg/dL (ref 0–99)
NONHDL: 97.44
Triglycerides: 38 mg/dL (ref 0.0–149.0)
VLDL: 7.6 mg/dL (ref 0.0–40.0)

## 2015-12-02 LAB — POCT URINALYSIS DIPSTICK
GLUCOSE UA: NEGATIVE
KETONES UA: NEGATIVE
Leukocytes, UA: NEGATIVE
NITRITE UA: NEGATIVE
PH UA: 7
RBC UA: NEGATIVE
SPEC GRAV UA: 1.015
Urobilinogen, UA: 1

## 2015-12-02 LAB — BASIC METABOLIC PANEL
BUN: 14 mg/dL (ref 6–23)
CHLORIDE: 101 meq/L (ref 96–112)
CO2: 31 mEq/L (ref 19–32)
Calcium: 11.7 mg/dL — ABNORMAL HIGH (ref 8.4–10.5)
Creatinine, Ser: 0.8 mg/dL (ref 0.40–1.50)
GFR: 98.85 mL/min (ref >60.00)
Glucose, Bld: 95 mg/dL (ref 70–99)
POTASSIUM: 3.7 meq/L (ref 3.5–5.1)
Sodium: 140 mEq/L (ref 135–145)

## 2015-12-02 LAB — PSA: PSA: 2.16 ng/mL (ref 0.10–4.00)

## 2015-12-02 LAB — TSH: TSH: 2.01 u[IU]/mL (ref 0.35–4.50)

## 2015-12-02 MED ORDER — RIVAROXABAN 20 MG PO TABS: 20.0000 mg | ORAL_TABLET | Freq: Every day | ORAL | Status: DC

## 2015-12-02 MED ORDER — SILODOSIN 8 MG PO CAPS: 8.0000 mg | ORAL_CAPSULE | Freq: Every day | ORAL | Status: DC

## 2015-12-02 MED ORDER — PANTOPRAZOLE SODIUM 40 MG PO TBEC: 40.0000 mg | DELAYED_RELEASE_TABLET | Freq: Two times a day (BID) | ORAL | Status: DC

## 2015-12-02 MED ORDER — FINASTERIDE 5 MG PO TABS: 5.0000 mg | ORAL_TABLET | Freq: Every day | ORAL | Status: DC

## 2015-12-02 MED ORDER — DONEPEZIL HCL 5 MG PO TABS: 5.0000 mg | ORAL_TABLET | Freq: Every day | ORAL | Status: DC

## 2015-12-02 MED ORDER — ATORVASTATIN CALCIUM 10 MG PO TABS: 10.0000 mg | ORAL_TABLET | Freq: Every day | ORAL | Status: DC

## 2015-12-02 MED ORDER — DOXAZOSIN MESYLATE 4 MG PO TABS: 4.0000 mg | ORAL_TABLET | Freq: Every day | ORAL | Status: DC

## 2015-12-02 MED ORDER — FUROSEMIDE 40 MG PO TABS: 40.0000 mg | ORAL_TABLET | Freq: Every day | ORAL | Status: DC

## 2015-12-02 NOTE — Progress Notes (Signed)
Pre visit review using our clinic review tool, if applicable. No additional management support is needed unless otherwise documented below in the visit note. 

## 2015-12-02 NOTE — Progress Notes (Signed)
   Subjective:    Patient ID: [name], male    DOB: July 17, 1933, 82 y.o.   MRN: [id number]  HPI 82 yr old male to follow up on multiple issues. He is accompanied by his wife. His BP has been stable. His arthritis pains are well managed. He still plays 9 holes of golf several days a week. His wife wants to talk about about some memory problems. [name] admits his memory fails him at times but apparently this has rapidly worsened in the last 6 months. He asks questions over and over, and he forgets what answers he gets to these questions. No mood changes. He sleeps well. Good appetite.    Review of Systems  Constitutional: Negative.   HENT: Negative.   Eyes: Negative.   Respiratory: Negative.   Cardiovascular: Negative.   Gastrointestinal: Negative.   Genitourinary: Negative.   Musculoskeletal: Negative.   Skin: Negative.   Neurological: Negative.   Psychiatric/Behavioral: Negative.        Objective:   Physical Exam  Constitutional: He is oriented to person, place, and time. He appears well-developed and well-nourished. No distress.  HENT:  Head: Normocephalic and atraumatic.  Right Ear: External ear normal.  Left Ear: External ear normal.  Nose: Nose normal.  Mouth/Throat: Oropharynx is clear and moist. No oropharyngeal exudate.  Eyes: Conjunctivae and EOM are normal. Pupils are equal, round, and reactive to light. Right eye exhibits no discharge. Left eye exhibits no discharge. No scleral icterus.  Neck: Neck supple. No JVD present. No tracheal deviation present. No thyromegaly present.  Cardiovascular: Normal rate, regular rhythm, normal heart sounds and intact distal pulses.  Exam reveals no gallop and no friction rub.   No murmur heard. EKG shows stable RBBB  Pulmonary/Chest: Effort normal and breath sounds normal. No respiratory distress. He has no wheezes. He has no rales. He exhibits no tenderness.  Abdominal: Soft. Bowel sounds are normal. He exhibits no distension  and no mass. There is no tenderness. There is no rebound and no guarding.  Genitourinary: Rectum normal, prostate normal and penis normal. Guaiac negative stool. No penile tenderness.  Musculoskeletal: Normal range of motion. He exhibits no edema or tenderness.  Lymphadenopathy:    He has no cervical adenopathy.  Neurological: He is alert and oriented to person, place, and time. He has normal reflexes. No cranial nerve deficit. He exhibits normal muscle tone. Coordination normal.  Skin: Skin is warm and dry. No rash noted. He is not diaphoretic. No erythema. No pallor.  Psychiatric: He has a normal mood and affect. His behavior is normal. Judgment and thought content normal.          Assessment & Plan:  His HTN and GERD are stable. His arthritis is stable. We will get fasting labs today. He is having some memory issues so he will try Aricept 5 mg daily. Recheck one month

## 2015-12-30 DIAGNOSIS — L738 Other specified follicular disorders: Secondary | ICD-10-CM | POA: Diagnosis not present

## 2015-12-30 DIAGNOSIS — D692 Other nonthrombocytopenic purpura: Secondary | ICD-10-CM | POA: Diagnosis not present

## 2015-12-30 DIAGNOSIS — L570 Actinic keratosis: Secondary | ICD-10-CM | POA: Diagnosis not present

## 2015-12-30 DIAGNOSIS — C4441 Basal cell carcinoma of skin of scalp and neck: Secondary | ICD-10-CM | POA: Diagnosis not present

## 2015-12-30 DIAGNOSIS — Z85828 Personal history of other malignant neoplasm of skin: Secondary | ICD-10-CM | POA: Diagnosis not present

## 2015-12-30 DIAGNOSIS — L821 Other seborrheic keratosis: Secondary | ICD-10-CM | POA: Diagnosis not present

## 2016-01-02 ENCOUNTER — Ambulatory Visit (INDEPENDENT_AMBULATORY_CARE_PROVIDER_SITE_OTHER): Payer: Medicare Other | Admitting: Family Medicine

## 2016-01-02 ENCOUNTER — Encounter: Payer: Self-pay | Admitting: Family Medicine

## 2016-01-02 VITALS — BP 123/87 | HR 79 | Temp 98.7°F | Ht 70.25 in | Wt 196.0 lb

## 2016-01-02 DIAGNOSIS — R413 Other amnesia: Secondary | ICD-10-CM | POA: Diagnosis not present

## 2016-01-02 MED ORDER — DONEPEZIL HCL 5 MG PO TABS: 5.0000 mg | ORAL_TABLET | Freq: Every day | ORAL | Status: DC

## 2016-01-02 NOTE — Progress Notes (Signed)
Pre visit review using our clinic review tool, if applicable. No additional management support is needed unless otherwise documented below in the visit note. 

## 2016-01-02 NOTE — Progress Notes (Signed)
   Subjective:    Patient ID: [name], male    DOB: 1933-08-25, 82 y.o.   MRN: [id number]  HPI Here with his wife to follow up memory loss. He has been on Aricept 5 mg for the past month, and both of them a re very pleased with the results. He says his memory is improved and his wife agrees. No side effects to report.    Review of Systems  Constitutional: Negative.   Respiratory: Negative.   Cardiovascular: Negative.   Neurological: Negative.        Objective:   Physical Exam  Constitutional: He is oriented to person, place, and time. He appears well-developed and well-nourished.  Cardiovascular: Normal rate, regular rhythm, normal heart sounds and intact distal pulses.   Pulmonary/Chest: Effort normal and breath sounds normal.  Neurological: He is alert and oriented to person, place, and time.  Psychiatric: He has a normal mood and affect. His behavior is normal. Judgment and thought content normal.          Assessment & Plan:

## 2016-01-09 ENCOUNTER — Other Ambulatory Visit: Payer: Self-pay | Admitting: Family Medicine

## 2016-03-19 DIAGNOSIS — I824Z3 Acute embolism and thrombosis of unspecified deep veins of distal lower extremity, bilateral: Secondary | ICD-10-CM | POA: Diagnosis not present

## 2016-03-19 DIAGNOSIS — I872 Venous insufficiency (chronic) (peripheral): Secondary | ICD-10-CM | POA: Diagnosis not present

## 2016-04-12 ENCOUNTER — Encounter: Payer: Self-pay | Admitting: Family Medicine

## 2016-04-12 ENCOUNTER — Ambulatory Visit (INDEPENDENT_AMBULATORY_CARE_PROVIDER_SITE_OTHER): Payer: Medicare Other | Admitting: Family Medicine

## 2016-04-12 VITALS — BP 148/85 | HR 76 | Temp 98.9°F | Ht 70.25 in | Wt 194.0 lb

## 2016-04-12 DIAGNOSIS — R413 Other amnesia: Secondary | ICD-10-CM | POA: Diagnosis not present

## 2016-04-12 DIAGNOSIS — H9313 Tinnitus, bilateral: Secondary | ICD-10-CM | POA: Diagnosis not present

## 2016-04-12 DIAGNOSIS — H9319 Tinnitus, unspecified ear: Secondary | ICD-10-CM | POA: Insufficient documentation

## 2016-04-12 MED ORDER — DONEPEZIL HCL 10 MG PO TABS: 10.0000 mg | ORAL_TABLET | Freq: Every day | ORAL | Status: DC

## 2016-04-12 NOTE — Progress Notes (Signed)
   Subjective:    Patient ID: [name], male    DOB: 09-21-1933, 82 y.o.   MRN: [id number]  HPI Here asking about ringing in both ears. This started some years ago but it is getting worse. No pain or dizziness. No hearing loss. He takes Tylenol if needed for joint pains and he avoids NSAIDs for the most part. Also he is pleased with how the Aricept has improved his memory, and he wants to increase the dose.    Review of Systems  Constitutional: Negative.   HENT: Positive for tinnitus. Negative for congestion, ear discharge, ear pain, hearing loss and sinus pressure.   Neurological: Negative.       Objective:   Physical Exam  Constitutional: He is oriented to person, place, and time. He appears well-developed and well-nourished.  HENT:  Right Ear: External ear normal.  Left Ear: External ear normal.  Nose: Nose normal.  Mouth/Throat: Oropharynx is clear and moist.  Eyes: Conjunctivae are normal.  Neck: Neck supple. No thyromegaly present.  Lymphadenopathy:    He has no cervical adenopathy.  Neurological: He is alert and oriented to person, place, and time.          Assessment & Plan:  For the memory loss, we will increase Aricept to 10 mg daily. For the tinnitus, we will refer him to ENT.  Laurey Morale, MD

## 2016-04-12 NOTE — Progress Notes (Signed)
Pre visit review using our clinic review tool, if applicable. No additional management support is needed unless otherwise documented below in the visit note. 

## 2016-04-27 ENCOUNTER — Other Ambulatory Visit: Payer: Self-pay | Admitting: Family Medicine

## 2016-05-08 DIAGNOSIS — J343 Hypertrophy of nasal turbinates: Secondary | ICD-10-CM | POA: Diagnosis not present

## 2016-05-08 DIAGNOSIS — H9311 Tinnitus, right ear: Secondary | ICD-10-CM | POA: Diagnosis not present

## 2016-05-08 DIAGNOSIS — H6121 Impacted cerumen, right ear: Secondary | ICD-10-CM | POA: Diagnosis not present

## 2016-05-24 ENCOUNTER — Other Ambulatory Visit: Payer: Self-pay | Admitting: Family Medicine

## 2016-06-29 DIAGNOSIS — L821 Other seborrheic keratosis: Secondary | ICD-10-CM | POA: Diagnosis not present

## 2016-06-29 DIAGNOSIS — L720 Epidermal cyst: Secondary | ICD-10-CM | POA: Diagnosis not present

## 2016-06-29 DIAGNOSIS — C44519 Basal cell carcinoma of skin of other part of trunk: Secondary | ICD-10-CM | POA: Diagnosis not present

## 2016-06-29 DIAGNOSIS — Z85828 Personal history of other malignant neoplasm of skin: Secondary | ICD-10-CM | POA: Diagnosis not present

## 2016-06-29 DIAGNOSIS — L570 Actinic keratosis: Secondary | ICD-10-CM | POA: Diagnosis not present

## 2016-08-10 DIAGNOSIS — H1852 Epithelial (juvenile) corneal dystrophy: Secondary | ICD-10-CM | POA: Diagnosis not present

## 2016-08-22 DIAGNOSIS — H43813 Vitreous degeneration, bilateral: Secondary | ICD-10-CM | POA: Diagnosis not present

## 2016-08-22 DIAGNOSIS — H35371 Puckering of macula, right eye: Secondary | ICD-10-CM | POA: Diagnosis not present

## 2016-08-22 DIAGNOSIS — H35363 Drusen (degenerative) of macula, bilateral: Secondary | ICD-10-CM | POA: Diagnosis not present

## 2016-08-22 DIAGNOSIS — H35432 Paving stone degeneration of retina, left eye: Secondary | ICD-10-CM | POA: Diagnosis not present

## 2016-09-04 ENCOUNTER — Ambulatory Visit (INDEPENDENT_AMBULATORY_CARE_PROVIDER_SITE_OTHER): Payer: Medicare Other

## 2016-09-04 DIAGNOSIS — Z23 Encounter for immunization: Secondary | ICD-10-CM | POA: Diagnosis not present

## 2016-09-16 ENCOUNTER — Other Ambulatory Visit: Payer: Self-pay | Admitting: Family Medicine

## 2016-11-01 DIAGNOSIS — I824Z3 Acute embolism and thrombosis of unspecified deep veins of distal lower extremity, bilateral: Secondary | ICD-10-CM | POA: Diagnosis not present

## 2016-11-01 DIAGNOSIS — I83893 Varicose veins of bilateral lower extremities with other complications: Secondary | ICD-10-CM | POA: Diagnosis not present

## 2016-11-25 ENCOUNTER — Other Ambulatory Visit: Payer: Self-pay | Admitting: Family Medicine

## 2016-12-04 ENCOUNTER — Ambulatory Visit (INDEPENDENT_AMBULATORY_CARE_PROVIDER_SITE_OTHER): Payer: Medicare Other

## 2016-12-04 DIAGNOSIS — Z0000 Encounter for general adult medical examination without abnormal findings: Secondary | ICD-10-CM | POA: Diagnosis not present

## 2016-12-04 DIAGNOSIS — Z23 Encounter for immunization: Secondary | ICD-10-CM

## 2016-12-04 NOTE — Progress Notes (Addendum)
Subjective:   [name] is a 83 y.o. male who presents for Medicare Annual/Subsequent preventive examination.  The Patient was informed that the wellness visit is to identify future health risk and educate and initiate measures that can reduce risk for increased disease through the lifespan.    NO ROS; Medicare Wellness Visit  Describes health as good, fair or great?  Ok Wished knees were better; loves sports and was playing golf but now can do less and less;     The following written screening schedule of preventive measures were reviewed with assessment and plan made per below and patient instructions:  Smoking history reviewed / former smoker; 7 years; quit 87'  Also educated regarding AAA for male tobacco users  Use of Smokeless tobacco no Second Hand Smoke status; No Smokers in the home     ETOH 2 scotch per day Discussed recommendation for limit of 2;   RISK FACTORS Hx of TKR; 2014;  Regular exercise- educated regarding active lifestyle according to personal preferences Agreed to walk brief periods or continue to work on balance and strength in his upper body.   Diet; Breakfast;  not very detailed  Eats veal;  Vegetables; Fruits; oranges;  Chocolate bar occasionally per his wife  Cookies per his wife BMI within normal limits   Lipids reviewed and reducing cholesterol discussed  Referral to Diabetes and Nutritional center if appropriate   Fall risk: presented mobile with no AD; get up go slow but maintains his health.  Mobility of Functional changes this year? 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 Never been lost;  Judgement is still good;  Engaged in normal conversations but repeats himself on occasion. Recall we 3/3 with prompts. Credit given but recall is an issue.  Serial 7's from 100 x 5   Advanced Directives reviewed for completion; completed; attorney has drawn up   Patient Care Team: Laurey Morale, MD as PCP - General Renato Shin, MD (Internal Medicine) Gaynelle Arabian, MD (Orthopedic Surgery) Netta Cedars, MD (Orthopedic Surgery) Macarthur Critchley, OD as Referring Physician (Optometry) Dr Renaldo Reel; Tawanna Cooler screens reviewed Colonoscopy hx of colon cancer/ aged out   PSA deferred     Immunization History  Administered Date(s) Administered  . Influenza Split 09/16/2012, 09/25/2013  . Influenza Whole 09/08/2009, 08/26/2010  . Influenza, High Dose Seasonal PF 11/15/2014, 08/30/2015, 09/04/2016  . Influenza-Unspecified 08/27/2011  . Pneumococcal Conjugate-13 12/02/2015  . Pneumococcal Polysaccharide-23 11/26/2006  . Td 11/26/2004  . Tdap 12/04/2016  . Zoster 11/26/2004   Required Immunizations needed today  Screening test up to date or reviewed for plan of completion Health Maintenance Due  Topic Date Due  . ZOSTAVAX  11/16/1995  . TETANUS/TDAP  11/26/2014   He had shingles  vaccination at the health [facility]  around 2006;  Educated regarding 2018 shingles   Educated to check with insurance regarding coverage of new Shingles vaccination on Part D or Part B and may have lower co-pay if provided on the Part D side  A Tetanus is recommended every 10 years. Medicare covers a tetanus if you have a cut or wound; otherwise, there may  be a charge. If you had not had a tetanus with pertusses, known as the Tdap, you can take this anytime.   Agreed to take the Tdap today  Cardiac Risk Factors include: advanced age (>27men, >75 women);dyslipidemia;family history of premature cardiovascular disease;hypertension;male gender     Objective:    Vitals: BP 100/70   Pulse 78   Ht 6' (1.829 m)   Wt 189 lb 2 oz (85.8 kg)   SpO2 98%   BMI 25.65 kg/m   Body mass index is 25.65 kg/m.  Tobacco History  Smoking Status  . Former Smoker  . Packs/day: 0.50  . Years: 7.00  . Types: Cigarettes  . Start date: 01/18/1955  . Quit date: 11/26/1961  Smokeless Tobacco  . Never Used    Comment: quit > 50 years ado     Counseling given: Yes   Past Medical History:  Diagnosis Date  . Allergy   . BPH (benign prostatic hypertrophy)   . Diverticulosis   . DVT of lower extremity (deep venous thrombosis) (HCC)    right leg, 01-10-11. sees Dr. Linus Mako   . ED (erectile dysfunction)   . GERD (gastroesophageal reflux disease)   . Headache(784.0)   . Hypercalcemia    sees Dr Loanne Drilling  . Hyperparathyroidism ([place])   . Hypertension   . Neuromuscular disorder (HCC)    sciatica  . Normal cardiac stress test    10-29-08  . Osteoarthritis   . Other and unspecified hyperlipidemia   . Personal history of colonic polyps   . Pulmonary embolism ([place])    01-10-11   Past Surgical History:  Procedure Laterality Date  . BASAL CELL CARCINOMA EXCISION     removed face back Dr Sherrye Payor, had MOHS Dr Sarajane Jews  . COLONOSCOPY  09-16-13   per Dr. Deatra Ina, benign polyps and severe diverticulosis, no repeats   . ENDOVENOUS ABLATION SAPHENOUS VEIN W/ LASER Bilateral 05-28-13   per Dr. Renaldo Reel   . HERNIA REPAIR     Inguinal right Dr Rise Patience  3-05  . JOINT REPLACEMENT     rt knee replacement 2003 Dr Wynelle Link  . KNEE ARTHROSCOPY     rt knee  . KNEE ARTHROSCOPY     left knee 2011 Dr Wynelle Link   . TONSILLECTOMY    . TOTAL KNEE ARTHROPLASTY   12/08/2012   Procedure: TOTAL KNEE ARTHROPLASTY;  Surgeon: [name], MD;  Location: [facility];  Service: Orthopedics;  Laterality: Left;  . vasectomy     Family History  Problem Relation Age of Onset  . Heart disease Mother   . Colon cancer Father   . Arthritis Other   . Cancer Other     Colon 1st degree relative <60  . Pancreatic cancer Neg Hx   . Stomach cancer Neg Hx    History  Sexual Activity  . Sexual activity: Not on file    Outpatient Encounter Prescriptions as of 12/04/2016  Medication Sig  . atorvastatin (LIPITOR) 10 MG tablet Take 1 tablet (10 mg total) by mouth daily.  . diclofenac sodium (VOLTAREN) 1 % GEL Apply topically daily.  Marland Kitchen  donepezil (ARICEPT) 10 MG tablet Take 1 tablet (10 mg total) by mouth at bedtime.  Marland Kitchen doxazosin (CARDURA) 4 MG tablet Take 1 tablet (4 mg total) by mouth at bedtime. Reported on 12/02/2015  . finasteride (PROSCAR) 5 MG tablet TAKE 1 TABLET(5 MG) BY MOUTH DAILY  . fluticasone (FLONASE) 50 MCG/ACT nasal spray SHAKE LIQUID AND USE 2 SPRAYS IN EACH NOSTRIL EVERY DAY  . furosemide (LASIX) 40 MG tablet Take 1 tablet (40 mg total) by mouth daily before breakfast.  . pantoprazole (PROTONIX) 40 MG tablet Take 1 tablet (40 mg total) by mouth 2 (two) times daily.  . polyethylene glycol powder (GLYCOLAX/MIRALAX) powder Take 17 g by mouth daily.   . Psyllium (METAMUCIL) 30.9 % POWD Take 15 mLs by mouth daily.   . rivaroxaban (XARELTO) 20 MG TABS tablet Take 1 tablet (20 mg total) by mouth daily with supper.  . silodosin (RAPAFLO) 8 MG CAPS capsule Take 1 capsule (8 mg total) by mouth daily with breakfast.   Facility-Administered Encounter Medications as of 12/04/2016  Medication  . bupivacaine liposome (EXPAREL) 1.3 % injection 266 mg    Activities of Daily Living In your present state of health, do you have any difficulty performing the following activities: 12/04/2016  Hearing? N  Vision? N  Difficulty concentrating or making decisions? N  Walking  or climbing stairs? Y  Dressing or bathing? N  Doing errands, shopping? N  Preparing Food and eating ? N  Using the Toilet? N  In the past six months, have you accidently leaked urine? N  Do you have problems with loss of bowel control? N  Managing your Medications? N  Managing your Finances? N  Housekeeping or managing your Housekeeping? N  Some recent data might be hidden    Patient Care Team: Laurey Morale, MD as PCP - General Renato Shin, MD (Internal Medicine) Gaynelle Arabian, MD (Orthopedic Surgery) Netta Cedars, MD (Orthopedic Surgery) Macarthur Critchley, OD as Referring Physician (Optometry)   Assessment:     Exercise Activities and Dietary recommendations Current Exercise Habits: Home exercise routine, Time (Minutes): 20, Intensity: Mild  Goals    . Exercise 150 minutes per week (moderate activity)          Will try to start walking more; Belongs to [place]  12/04/2016 12/02/2015 01/18/2014  Falls in the past year? No No No  Number falls in past yr: 1 - -  Risk for fall due to : - - Other (Comment)  Follow up Education provided - -   Depression Screen PHQ 2/9 Scores 12/04/2016 12/02/2015 01/18/2014  PHQ - 2 Score 0 0 0    Cognitive Function MMSE - Mini Mental State Exam 12/04/2016  Orientation to time 4  Orientation to Place 5  Registration 3  Attention/ Calculation 5  Recall 3  Language- name 2 objects 2  Language- repeat 1  Language- follow 3 step command 3  Language- read & follow direction 1  Write a sentence 1  Copy design 1  Total score 29   To note; recall 3/3 with cuing       Immunization History  Administered Date(s) Administered  . Influenza Split 09/16/2012, 09/25/2013  . Influenza Whole 09/08/2009, 08/26/2010  . Influenza, High Dose Seasonal PF 11/15/2014, 08/30/2015, 09/04/2016  . Influenza-Unspecified 08/27/2011  . Pneumococcal Conjugate-13 12/02/2015  . Pneumococcal Polysaccharide-23 11/26/2006  . Td  11/26/2004  . Tdap 12/04/2016  . Zoster 11/26/2004  Screening Tests Health Maintenance  Topic Date Due  . ZOSTAVAX  11/16/1995  . TETANUS/TDAP  11/26/2014  . INFLUENZA VACCINE  Completed  . PNA vac Low Risk Adult  Completed      Plan:    Will take the Tdap today  Will consider hearing exam;   Will make apt with Dr. Sarajane Jews for annual review of medical conditions and labs;  Is taking aricept 10mg  once a day and 10mg  is helping over 5 mg States Dr. Sarajane Jews told him he could do this.   During the course of the visit the patient was educated and counseled about the following appropriate screening and preventive services:   Vaccines to include Pneumoccal, Influenza, Hepatitis B, Td, Zostavax, HCV  Electrocardiogram  Cardiovascular Disease/ rate 122 on admission but was walking and up on scale; later down to 78 ; pulse ox good > 96   Colorectal cancer screening aged out  Diabetes screening neg  Prostate Cancer Screening deferred   Glaucoma screening  Nutrition counseling   Smoking cessation counseling n/a  Has remote and limited smoking hx;   Patient Instructions (the written plan) was given to the patient.    RZNBV,APOLI, RN  12/04/2016  I have read this note and agree with its contents.  Alysia Penna, MD

## 2016-12-04 NOTE — Patient Instructions (Addendum)
[name] , Thank you for taking time to come for your Medicare Wellness Visit. I appreciate your ongoing commitment to your health goals. Please review the following plan we discussed and let me know if I can assist you in the future.   Will take the Tdap today  Will consider hearing exam;   Will make apt with Dr. Sarajane Jews for annual review and labs   These are the goals we discussed: Goals    . Exercise 150 minutes per week (moderate activity)          Will try to start walking more; Belongs to [employer]       This is a list of the screening recommended for you and due dates:  Health Maintenance  Topic Date Due  . Shingles Vaccine  11/16/1995  . Tetanus Vaccine  11/26/2014  . Flu Shot  Completed  . Pneumonia vaccines  Completed      Fall Prevention in the Home Introduction Falls can cause injuries. They can happen to people of all ages. There are many things you can do to make your home safe and to help prevent falls. What can I do on the outside of my home?  Regularly fix the edges of walkways and driveways and fix any cracks.  Remove anything that might make you trip as you walk through a door, such as a raised step or threshold.  Trim any bushes or trees on the path to your home.  Use bright outdoor lighting.  Clear any walking paths of anything that might make someone trip, such as rocks or tools.  Regularly check to see if handrails are loose or broken. Make sure that both sides of any steps have handrails.  Any raised decks and porches should have guardrails on the edges.  Have any leaves, snow, or ice cleared regularly.  Use sand or salt on walking paths during winter.  Clean up any spills in your garage right away. This includes oil or grease spills. What can I do in the bathroom?  Use night lights.  Install grab bars by the toilet and in the tub and shower. Do not use towel bars as grab bars.  Use non-skid mats or decals in the tub or  shower.  If you need to sit down in the shower, use a plastic, non-slip stool.  Keep the floor dry. Clean up any water that spills on the floor as soon as it happens.  Remove soap buildup in the tub or shower regularly.  Attach bath mats securely with double-sided non-slip rug tape.  Do not have throw rugs and other things on the floor that can make you trip. What can I do in the bedroom?  Use night lights.  Make sure that you have a light by your bed that is easy to reach.  Do not use any sheets or blankets that are too big for your bed. They should not hang down onto the floor.  Have a firm chair that has side arms. You can use this for support while you get dressed.  Do not have throw rugs and other things on the floor that can make you trip. What can I do in the kitchen?  Clean up any spills right away.  Avoid walking on wet floors.  Keep items that you use a lot in easy-to-reach places.  If you need to reach something above you, use a strong step stool that has a grab bar.  Keep electrical cords out  of the way.  Do not use floor polish or wax that makes floors slippery. If you must use wax, use non-skid floor wax.  Do not have throw rugs and other things on the floor that can make you trip. What can I do with my stairs?  Do not leave any items on the stairs.  Make sure that there are handrails on both sides of the stairs and use them. Fix handrails that are broken or loose. Make sure that handrails are as long as the stairways.  Check any carpeting to make sure that it is firmly attached to the stairs. Fix any carpet that is loose or worn.  Avoid having throw rugs at the top or bottom of the stairs. If you do have throw rugs, attach them to the floor with carpet tape.  Make sure that you have a light switch at the top of the stairs and the bottom of the stairs. If you do not have them, ask someone to add them for you. What else can I do to help prevent  falls?  Wear shoes that:  Do not have high heels.  Have rubber bottoms.  Are comfortable and fit you well.  Are closed at the toe. Do not wear sandals.  If you use a stepladder:  Make sure that it is fully opened. Do not climb a closed stepladder.  Make sure that both sides of the stepladder are locked into place.  Ask someone to hold it for you, if possible.  Clearly mark and make sure that you can see:  Any grab bars or handrails.  First and last steps.  Where the edge of each step is.  Use tools that help you move around (mobility aids) if they are needed. These include:  Canes.  Walkers.  Scooters.  Crutches.  Turn on the lights when you go into a dark area. Replace any light bulbs as soon as they burn out.  Set up your furniture so you have a clear path. Avoid moving your furniture around.  If any of your floors are uneven, fix them.  If there are any pets around you, be aware of where they are.  Review your medicines with your doctor. Some medicines can make you feel dizzy. This can increase your chance of falling. Ask your doctor what other things that you can do to help prevent falls. This information is not intended to replace advice given to you by your health care provider. Make sure you discuss any questions you have with your health care provider. Document Released: 09/08/2009 Document Revised: 04/19/2016 Document Reviewed: 12/17/2014  2017 Elsevier  Health Maintenance, Male A healthy lifestyle and preventative care can promote health and wellness.  Maintain regular health, dental, and eye exams.  Eat a healthy diet. Foods like vegetables, fruits, whole grains, low-fat dairy products, and lean protein foods contain the nutrients you need and are low in calories. Decrease your intake of foods high in solid fats, added sugars, and salt. Get information about a proper diet from your health care provider, if necessary.  Regular physical exercise is  one of the most important things you can do for your health. Most adults should get at least 150 minutes of moderate-intensity exercise (any activity that increases your heart rate and causes you to sweat) each week. In addition, most adults need muscle-strengthening exercises on 2 or more days a week.   Maintain a healthy weight. The body mass index (BMI) is a screening tool to identify  possible weight problems. It provides an estimate of body fat based on height and weight. Your health care provider can find your BMI and can help you achieve or maintain a healthy weight. For males 20 years and older:  A BMI below 18.5 is considered underweight.  A BMI of 18.5 to 24.9 is normal.  A BMI of 25 to 29.9 is considered overweight.  A BMI of 30 and above is considered obese.  Maintain normal blood lipids and cholesterol by exercising and minimizing your intake of saturated fat. Eat a balanced diet with plenty of fruits and vegetables. Blood tests for lipids and cholesterol should begin at age 63 and be repeated every 5 years. If your lipid or cholesterol levels are high, you are over age 4, or you are at high risk for heart disease, you may need your cholesterol levels checked more frequently.Ongoing high lipid and cholesterol levels should be treated with medicines if diet and exercise are not working.  If you smoke, find out from your health care provider how to quit. If you do not use tobacco, do not start.  Lung cancer screening is recommended for adults aged 5-80 years who are at high risk for developing lung cancer because of a history of smoking. A yearly low-dose CT scan of the lungs is recommended for people who have at least a 30-pack-year history of smoking and are current smokers or have quit within the past 15 years. A pack year of smoking is smoking an average of 1 pack of cigarettes a day for 1 year (for example, a 30-pack-year history of smoking could mean smoking 1 pack a day for 30  years or 2 packs a day for 15 years). Yearly screening should continue until the smoker has stopped smoking for at least 15 years. Yearly screening should be stopped for people who develop a health problem that would prevent them from having lung cancer treatment.  If you choose to drink alcohol, do not have more than 2 drinks per day. One drink is considered to be 12 oz (360 mL) of beer, 5 oz (150 mL) of wine, or 1.5 oz (45 mL) of liquor.  Avoid the use of street drugs. Do not share needles with anyone. Ask for help if you need support or instructions about stopping the use of drugs.  High blood pressure causes heart disease and increases the risk of stroke. High blood pressure is more likely to develop in:  People who have blood pressure in the end of the normal range (100-139/85-89 mm Hg).  People who are overweight or obese.  People who are African American.  If you are 17-23 years of age, have your blood pressure checked every 3-5 years. If you are 6 years of age or older, have your blood pressure checked every year. You should have your blood pressure measured twice-once when you are at a hospital or clinic, and once when you are not at a hospital or clinic. Record the average of the two measurements. To check your blood pressure when you are not at a hospital or clinic, you can use:  An automated blood pressure machine at a pharmacy.  A home blood pressure monitor.  If you are 80-62 years old, ask your health care provider if you should take aspirin to prevent heart disease.  Diabetes screening involves taking a blood sample to check your fasting blood sugar level. This should be done once every 3 years after age 9 if you are at a  normal weight and without risk factors for diabetes. Testing should be considered at a younger age or be carried out more frequently if you are overweight and have at least 1 risk factor for diabetes.  Colorectal cancer can be detected and often prevented.  Most routine colorectal cancer screening begins at the age of 28 and continues through age 41. However, your health care provider may recommend screening at an earlier age if you have risk factors for colon cancer. On a yearly basis, your health care provider may provide home test kits to check for hidden blood in the stool. A small camera at the end of a tube may be used to directly examine the colon (sigmoidoscopy or colonoscopy) to detect the earliest forms of colorectal cancer. Talk to your health care provider about this at age 53 when routine screening begins. A direct exam of the colon should be repeated every 5-10 years through age 52, unless early forms of precancerous polyps or small growths are found.  People who are at an increased risk for hepatitis B should be screened for this virus. You are considered at high risk for hepatitis B if:  You were born in a country where hepatitis B occurs often. Talk with your health care provider about which countries are considered high risk.  Your parents were born in a high-risk country and you have not received a shot to protect against hepatitis B (hepatitis B vaccine).  You have HIV or AIDS.  You use needles to inject street drugs.  You live with, or have sex with, someone who has hepatitis B.  You are a man who has sex with other men (MSM).  You get hemodialysis treatment.  You take certain medicines for conditions like cancer, organ transplantation, and autoimmune conditions.  Hepatitis C blood testing is recommended for all people born from [birth year] through 1963 and any individual with known risk factors for hepatitis C.  Healthy men should no longer receive prostate-specific antigen (PSA) blood tests as part of routine cancer screening. Talk to your health care provider about prostate cancer screening.  Testicular cancer screening is not recommended for adolescents or adult males who have no symptoms. Screening includes self-exam, a health  care provider exam, and other screening tests. Consult with your health care provider about any symptoms you have or any concerns you have about testicular cancer.  Practice safe sex. Use condoms and avoid high-risk sexual practices to reduce the spread of sexually transmitted infections (STIs).  You should be screened for STIs, including gonorrhea and chlamydia if:  You are sexually active and are younger than 24 years.  You are older than 24 years, and your health care provider tells you that you are at risk for this type of infection.  Your sexual activity has changed since you were last screened, and you are at an increased risk for chlamydia or gonorrhea. Ask your health care provider if you are at risk.  If you are at risk of being infected with HIV, it is recommended that you take a prescription medicine daily to prevent HIV infection. This is called pre-exposure prophylaxis (PrEP). You are considered at risk if:  You are a man who has sex with other men (MSM).  You are a heterosexual man who is sexually active with multiple partners.  You take drugs by injection.  You are sexually active with a partner who has HIV.  Talk with your health care provider about whether you are at high risk of being  infected with HIV. If you choose to begin PrEP, you should first be tested for HIV. You should then be tested every 3 months for as long as you are taking PrEP.  Use sunscreen. Apply sunscreen liberally and repeatedly throughout the day. You should seek shade when your shadow is shorter than you. Protect yourself by wearing long sleeves, pants, a wide-brimmed hat, and sunglasses year round whenever you are outdoors.  Tell your health care provider of new moles or changes in moles, especially if there is a change in shape or color. Also, tell your health care provider if a mole is larger than the size of a pencil eraser.  A one-time screening for abdominal aortic aneurysm (AAA) and surgical  repair of large AAAs by ultrasound is recommended for men aged 39-75 years who are current or former smokers.  Stay current with your vaccines (immunizations). This information is not intended to replace advice given to you by your health care provider. Make sure you discuss any questions you have with your health care provider. Document Released: 05/10/2008 Document Revised: 12/03/2014 Document Reviewed: 08/16/2015 Elsevier Interactive Patient Education  2017 [employer].

## 2016-12-11 ENCOUNTER — Encounter: Payer: Self-pay | Admitting: Family Medicine

## 2016-12-11 ENCOUNTER — Ambulatory Visit (INDEPENDENT_AMBULATORY_CARE_PROVIDER_SITE_OTHER): Payer: Medicare Other | Admitting: Family Medicine

## 2016-12-11 VITALS — BP 138/80 | HR 65 | Temp 99.0°F | Ht 72.0 in | Wt 190.0 lb

## 2016-12-11 DIAGNOSIS — I10 Essential (primary) hypertension: Secondary | ICD-10-CM | POA: Diagnosis not present

## 2016-12-11 DIAGNOSIS — N138 Other obstructive and reflux uropathy: Secondary | ICD-10-CM

## 2016-12-11 DIAGNOSIS — K4090 Unilateral inguinal hernia, without obstruction or gangrene, not specified as recurrent: Secondary | ICD-10-CM

## 2016-12-11 DIAGNOSIS — E782 Mixed hyperlipidemia: Secondary | ICD-10-CM | POA: Diagnosis not present

## 2016-12-11 DIAGNOSIS — N401 Enlarged prostate with lower urinary tract symptoms: Secondary | ICD-10-CM

## 2016-12-11 DIAGNOSIS — R413 Other amnesia: Secondary | ICD-10-CM

## 2016-12-11 DIAGNOSIS — K219 Gastro-esophageal reflux disease without esophagitis: Secondary | ICD-10-CM

## 2016-12-11 DIAGNOSIS — R609 Edema, unspecified: Secondary | ICD-10-CM

## 2016-12-11 DIAGNOSIS — M159 Polyosteoarthritis, unspecified: Secondary | ICD-10-CM

## 2016-12-11 LAB — HEPATIC FUNCTION PANEL
ALT: 17 U/L (ref 0–53)
AST: 20 U/L (ref 0–37)
Albumin: 4.1 g/dL (ref 3.5–5.2)
Alkaline Phosphatase: 45 U/L (ref 39–117)
BILIRUBIN TOTAL: 1.1 mg/dL (ref 0.2–1.2)
Bilirubin, Direct: 0.3 mg/dL (ref 0.0–0.3)
TOTAL PROTEIN: 6.5 g/dL (ref 6.0–8.3)

## 2016-12-11 LAB — POC URINALSYSI DIPSTICK (AUTOMATED)
Bilirubin, UA: NEGATIVE
Blood, UA: NEGATIVE
Glucose, UA: NEGATIVE
LEUKOCYTES UA: NEGATIVE
Nitrite, UA: NEGATIVE
PH UA: 8.5
SPEC GRAV UA: 1.015
Urobilinogen, UA: 1

## 2016-12-11 LAB — BASIC METABOLIC PANEL
BUN: 13 mg/dL (ref 6–23)
CALCIUM: 11.6 mg/dL — AB (ref 8.4–10.5)
CO2: 31 meq/L (ref 19–32)
CREATININE: 0.78 mg/dL (ref 0.40–1.50)
Chloride: 100 mEq/L (ref 96–112)
GFR: 101.52 mL/min (ref >60.00)
Glucose, Bld: 91 mg/dL (ref 70–99)
Potassium: 4 mEq/L (ref 3.5–5.1)
Sodium: 140 mEq/L (ref 135–145)

## 2016-12-11 LAB — CBC WITH DIFFERENTIAL/PLATELET
BASOS ABS: 0 10*3/uL (ref 0.0–0.1)
Basophils Relative: 0.2 % (ref 0.0–3.0)
EOS ABS: 0 10*3/uL (ref 0.0–0.7)
Eosinophils Relative: 0.4 % (ref 0.0–5.0)
HCT: 38.5 % — ABNORMAL LOW (ref 39.0–52.0)
Hemoglobin: 13.1 g/dL (ref 13.0–17.0)
LYMPHS ABS: 1.4 10*3/uL (ref 0.7–4.0)
Lymphocytes Relative: 16.8 % (ref 12.0–46.0)
MCHC: 34 g/dL (ref 30.0–36.0)
MCV: 100.3 fl — ABNORMAL HIGH (ref 78.0–100.0)
MONO ABS: 0.9 10*3/uL (ref 0.1–1.0)
Monocytes Relative: 10.6 % (ref 3.0–12.0)
Neutro Abs: 5.9 10*3/uL (ref 1.4–7.7)
Neutrophils Relative %: 72 % (ref 43.0–77.0)
Platelets: 259 10*3/uL (ref 150.0–400.0)
RBC: 3.84 Mil/uL — AB (ref 4.22–5.81)
RDW: 14.1 % (ref 11.5–15.5)
WBC: 8.2 10*3/uL (ref 4.0–10.5)

## 2016-12-11 LAB — PSA: PSA: 2.11 ng/mL (ref 0.10–4.00)

## 2016-12-11 LAB — LIPID PANEL
Cholesterol: 179 mg/dL (ref 0–200)
HDL: 84.2 mg/dL (ref >39.00)
LDL CALC: 84 mg/dL (ref 0–99)
NONHDL: 94.62
Total CHOL/HDL Ratio: 2
Triglycerides: 51 mg/dL (ref 0.0–149.0)
VLDL: 10.2 mg/dL (ref 0.0–40.0)

## 2016-12-11 LAB — TSH: TSH: 2.05 u[IU]/mL (ref 0.35–4.50)

## 2016-12-11 NOTE — Progress Notes (Signed)
Pre visit review using our clinic review tool, if applicable. No additional management support is needed unless otherwise documented below in the visit note. 

## 2016-12-11 NOTE — Progress Notes (Signed)
Subjective:    Patient ID: [name], male    DOB: August 13, 1933, 83 y.o.   MRN: [id number]  HPI 83 yr old male to follow up on multiple issues. He is here with his wife. First of all they are both concerned about his memory loss. He as been taking Aricept 10 mg daily for some months now, and it seemed to help at first, however now is memory is fading a bit again. He gets easily confused wen driving and loses his direction, even in familiar areas. His arthritis pain has worsened to the pain that he can no longer play golf. His GERD is well controlled. His BP is stable. During my exam I note a huge swelling in the scrotum. He denies any pain but agrees tat this has enlarged quite a bit over the past year. No trouble with bowel or bladder function.    Review of Systems  Constitutional: Negative.   HENT: Negative.   Eyes: Negative.   Respiratory: Negative.   Cardiovascular: Negative.   Gastrointestinal: Negative.   Genitourinary: Positive for scrotal swelling. Negative for difficulty urinating, dysuria, flank pain, frequency, hematuria, testicular pain and urgency.  Musculoskeletal: Positive for arthralgias, back pain and gait problem.  Skin: Negative.   Neurological: Negative for dizziness, tremors, seizures, syncope, facial asymmetry, speech difficulty, weakness, light-headedness, numbness and headaches.  Psychiatric/Behavioral: Negative.        Objective:   Physical Exam  Constitutional: He is oriented to person, place, and time. He appears well-developed and well-nourished. No distress.  He walks with an obvious limp, stooped forward at the waist   HENT:  Head: Normocephalic and atraumatic.  Right Ear: External ear normal.  Left Ear: External ear normal.  Nose: Nose normal.  Mouth/Throat: Oropharynx is clear and moist. No oropharyngeal exudate.  Eyes: Conjunctivae and EOM are normal. Pupils are equal, round, and reactive to light. Right eye exhibits no discharge. Left eye  exhibits no discharge. No scleral icterus.  Neck: Neck supple. No JVD present. No tracheal deviation present. No thyromegaly present.  Cardiovascular: Normal rate, regular rhythm, normal heart sounds and intact distal pulses.  Exam reveals no gallop and no friction rub.   No murmur heard. Pulmonary/Chest: Effort normal and breath sounds normal. No respiratory distress. He has no wheezes. He has no rales. He exhibits no tenderness.  Abdominal: Soft. Bowel sounds are normal. He exhibits no distension and no mass. There is no tenderness. There is no rebound and no guarding.  Genitourinary: Rectum normal, prostate normal and penis normal. Rectal exam shows guaiac negative stool. No penile tenderness.  Genitourinary Comments: The scrotum is quite enlarged, firm, non-tender   Musculoskeletal: Normal range of motion. He exhibits no edema or tenderness.  Lymphadenopathy:    He has no cervical adenopathy.  Neurological: He is alert and oriented to person, place, and time. He has normal reflexes. No cranial nerve deficit. He exhibits normal muscle tone. Coordination normal.  Skin: Skin is warm and dry. No rash noted. He is not diaphoretic. No erythema. No pallor.  Psychiatric: He has a normal mood and affect. His behavior is normal. Judgment and thought content normal.          Assessment & Plan:  His memory continues to fade so we will add Namenda 10 mg bid to his Aricept. He will continue to use Tylenol for joint pains. Get fasting labs. His BP is stable. His LE edema is stable. He continues on Xarelto for a hx of DVTs.  He seems to have a large inguinal hernia so we will refer him to Surgery to evaluate.  Alysia Penna, MD

## 2016-12-20 ENCOUNTER — Telehealth: Payer: Self-pay | Admitting: Family Medicine

## 2016-12-20 NOTE — Telephone Encounter (Signed)
° ° ° ° °  Pt call to say that he was seen on 1/12/01/16 and was told a med would be called in to help him with his memory. He called today to ask if this has been done

## 2016-12-21 NOTE — Telephone Encounter (Signed)
Call in Namenda 10 mg bid, #60 with 5 rf

## 2016-12-24 MED ORDER — MEMANTINE HCL 10 MG PO TABS: 10.0000 mg | ORAL_TABLET | Freq: Two times a day (BID) | ORAL | 5 refills | Status: DC

## 2016-12-24 NOTE — Telephone Encounter (Signed)
I sent script e-scribe to [facility] and spoke with pt.  

## 2016-12-26 ENCOUNTER — Other Ambulatory Visit: Payer: Self-pay | Admitting: Family Medicine

## 2016-12-28 DIAGNOSIS — K4090 Unilateral inguinal hernia, without obstruction or gangrene, not specified as recurrent: Secondary | ICD-10-CM | POA: Diagnosis not present

## 2016-12-31 DIAGNOSIS — Z85828 Personal history of other malignant neoplasm of skin: Secondary | ICD-10-CM | POA: Diagnosis not present

## 2016-12-31 DIAGNOSIS — D2262 Melanocytic nevi of left upper limb, including shoulder: Secondary | ICD-10-CM | POA: Diagnosis not present

## 2016-12-31 DIAGNOSIS — L570 Actinic keratosis: Secondary | ICD-10-CM | POA: Diagnosis not present

## 2016-12-31 DIAGNOSIS — D1801 Hemangioma of skin and subcutaneous tissue: Secondary | ICD-10-CM | POA: Diagnosis not present

## 2016-12-31 DIAGNOSIS — L821 Other seborrheic keratosis: Secondary | ICD-10-CM | POA: Diagnosis not present

## 2016-12-31 DIAGNOSIS — D045 Carcinoma in situ of skin of trunk: Secondary | ICD-10-CM | POA: Diagnosis not present

## 2016-12-31 DIAGNOSIS — D2261 Melanocytic nevi of right upper limb, including shoulder: Secondary | ICD-10-CM | POA: Diagnosis not present

## 2017-01-01 ENCOUNTER — Other Ambulatory Visit: Payer: Self-pay | Admitting: Family Medicine

## 2017-01-10 DIAGNOSIS — H43813 Vitreous degeneration, bilateral: Secondary | ICD-10-CM | POA: Diagnosis not present

## 2017-02-08 ENCOUNTER — Other Ambulatory Visit: Payer: Self-pay | Admitting: Family Medicine

## 2017-03-29 ENCOUNTER — Other Ambulatory Visit: Payer: Self-pay | Admitting: Family Medicine

## 2017-04-09 DIAGNOSIS — H25813 Combined forms of age-related cataract, bilateral: Secondary | ICD-10-CM | POA: Diagnosis not present

## 2017-04-09 DIAGNOSIS — H359 Unspecified retinal disorder: Secondary | ICD-10-CM | POA: Diagnosis not present

## 2017-04-09 DIAGNOSIS — H1852 Epithelial (juvenile) corneal dystrophy: Secondary | ICD-10-CM | POA: Diagnosis not present

## 2017-04-30 DIAGNOSIS — R600 Localized edema: Secondary | ICD-10-CM | POA: Diagnosis not present

## 2017-04-30 DIAGNOSIS — I824Z3 Acute embolism and thrombosis of unspecified deep veins of distal lower extremity, bilateral: Secondary | ICD-10-CM | POA: Diagnosis not present

## 2017-05-23 ENCOUNTER — Other Ambulatory Visit: Payer: Self-pay | Admitting: Family Medicine

## 2017-06-04 ENCOUNTER — Other Ambulatory Visit: Payer: Self-pay | Admitting: Family Medicine

## 2017-06-04 NOTE — Telephone Encounter (Signed)
Can we refill this? 

## 2017-06-10 DIAGNOSIS — N400 Enlarged prostate without lower urinary tract symptoms: Secondary | ICD-10-CM | POA: Diagnosis not present

## 2017-06-10 DIAGNOSIS — R350 Frequency of micturition: Secondary | ICD-10-CM | POA: Diagnosis not present

## 2017-06-10 DIAGNOSIS — R3912 Poor urinary stream: Secondary | ICD-10-CM | POA: Diagnosis not present

## 2017-06-22 ENCOUNTER — Other Ambulatory Visit: Payer: Self-pay | Admitting: Family Medicine

## 2017-06-24 ENCOUNTER — Other Ambulatory Visit: Payer: Self-pay | Admitting: Family Medicine

## 2017-07-02 DIAGNOSIS — D1801 Hemangioma of skin and subcutaneous tissue: Secondary | ICD-10-CM | POA: Diagnosis not present

## 2017-07-02 DIAGNOSIS — L821 Other seborrheic keratosis: Secondary | ICD-10-CM | POA: Diagnosis not present

## 2017-07-02 DIAGNOSIS — Z85828 Personal history of other malignant neoplasm of skin: Secondary | ICD-10-CM | POA: Diagnosis not present

## 2017-07-02 DIAGNOSIS — L570 Actinic keratosis: Secondary | ICD-10-CM | POA: Diagnosis not present

## 2017-07-04 DIAGNOSIS — H2513 Age-related nuclear cataract, bilateral: Secondary | ICD-10-CM | POA: Diagnosis not present

## 2017-07-09 ENCOUNTER — Other Ambulatory Visit: Payer: Self-pay | Admitting: Family Medicine

## 2017-07-09 NOTE — Telephone Encounter (Signed)
Looks like its been awhile since script was wrote, can we refill?

## 2017-08-22 ENCOUNTER — Other Ambulatory Visit: Payer: Self-pay | Admitting: Family Medicine

## 2017-09-02 DIAGNOSIS — Z23 Encounter for immunization: Secondary | ICD-10-CM | POA: Diagnosis not present

## 2017-12-16 ENCOUNTER — Other Ambulatory Visit: Payer: Self-pay | Admitting: Family Medicine

## 2017-12-16 NOTE — Telephone Encounter (Signed)
Last OV 12/11/2016  Rx last refilled 12/26/2016 disp 90 with 3 refills.   Sent to PCP for approval

## 2017-12-18 ENCOUNTER — Other Ambulatory Visit: Payer: Self-pay | Admitting: Family Medicine

## 2017-12-20 ENCOUNTER — Other Ambulatory Visit: Payer: Self-pay | Admitting: Family Medicine

## 2017-12-20 NOTE — Telephone Encounter (Signed)
12/11/2016  

## 2017-12-26 DIAGNOSIS — L820 Inflamed seborrheic keratosis: Secondary | ICD-10-CM | POA: Diagnosis not present

## 2017-12-26 DIAGNOSIS — Z85828 Personal history of other malignant neoplasm of skin: Secondary | ICD-10-CM | POA: Diagnosis not present

## 2017-12-26 DIAGNOSIS — D485 Neoplasm of uncertain behavior of skin: Secondary | ICD-10-CM | POA: Diagnosis not present

## 2018-01-27 ENCOUNTER — Other Ambulatory Visit: Payer: Self-pay | Admitting: Family Medicine

## 2018-03-15 ENCOUNTER — Other Ambulatory Visit: Payer: Self-pay | Admitting: Family Medicine

## 2018-03-18 NOTE — Telephone Encounter (Signed)
Last OV  12/11/2016   Last refilled 03/29/2017 disp 90 with 3 refills   Sent to PCP for approval to refill aricept

## 2018-03-27 DIAGNOSIS — I824Z3 Acute embolism and thrombosis of unspecified deep veins of distal lower extremity, bilateral: Secondary | ICD-10-CM | POA: Diagnosis not present

## 2018-03-27 DIAGNOSIS — R600 Localized edema: Secondary | ICD-10-CM | POA: Diagnosis not present

## 2018-04-26 ENCOUNTER — Other Ambulatory Visit: Payer: Self-pay | Admitting: Family Medicine

## 2018-04-28 NOTE — Telephone Encounter (Signed)
Last refill pt will need an office visit for more refills

## 2018-06-03 DIAGNOSIS — H9193 Unspecified hearing loss, bilateral: Secondary | ICD-10-CM | POA: Diagnosis not present

## 2018-06-03 DIAGNOSIS — H903 Sensorineural hearing loss, bilateral: Secondary | ICD-10-CM | POA: Diagnosis not present

## 2018-06-12 ENCOUNTER — Other Ambulatory Visit: Payer: Self-pay | Admitting: Family Medicine

## 2018-06-12 NOTE — Telephone Encounter (Signed)
Last OV 12/11/2016   Last refilled 06/04/2017 disp 180 with 3 refills   Sent to PCP for approval

## 2018-06-20 DIAGNOSIS — R350 Frequency of micturition: Secondary | ICD-10-CM | POA: Diagnosis not present

## 2018-06-20 DIAGNOSIS — R3912 Poor urinary stream: Secondary | ICD-10-CM | POA: Diagnosis not present

## 2018-07-03 DIAGNOSIS — D225 Melanocytic nevi of trunk: Secondary | ICD-10-CM | POA: Diagnosis not present

## 2018-07-03 DIAGNOSIS — Z85828 Personal history of other malignant neoplasm of skin: Secondary | ICD-10-CM | POA: Diagnosis not present

## 2018-07-03 DIAGNOSIS — L570 Actinic keratosis: Secondary | ICD-10-CM | POA: Diagnosis not present

## 2018-07-03 DIAGNOSIS — L821 Other seborrheic keratosis: Secondary | ICD-10-CM | POA: Diagnosis not present

## 2018-07-14 DIAGNOSIS — H2513 Age-related nuclear cataract, bilateral: Secondary | ICD-10-CM | POA: Diagnosis not present

## 2018-07-18 ENCOUNTER — Other Ambulatory Visit: Payer: Self-pay | Admitting: Family Medicine

## 2018-07-21 DIAGNOSIS — H1852 Epithelial (juvenile) corneal dystrophy: Secondary | ICD-10-CM | POA: Diagnosis not present

## 2018-07-21 DIAGNOSIS — H25811 Combined forms of age-related cataract, right eye: Secondary | ICD-10-CM | POA: Diagnosis not present

## 2018-07-21 DIAGNOSIS — H25812 Combined forms of age-related cataract, left eye: Secondary | ICD-10-CM | POA: Diagnosis not present

## 2018-07-21 DIAGNOSIS — Z01818 Encounter for other preprocedural examination: Secondary | ICD-10-CM | POA: Diagnosis not present

## 2018-08-07 DIAGNOSIS — H25812 Combined forms of age-related cataract, left eye: Secondary | ICD-10-CM | POA: Diagnosis not present

## 2018-08-07 DIAGNOSIS — H2512 Age-related nuclear cataract, left eye: Secondary | ICD-10-CM | POA: Diagnosis not present

## 2018-08-21 DIAGNOSIS — H2511 Age-related nuclear cataract, right eye: Secondary | ICD-10-CM | POA: Diagnosis not present

## 2018-08-21 DIAGNOSIS — H25811 Combined forms of age-related cataract, right eye: Secondary | ICD-10-CM | POA: Diagnosis not present

## 2018-10-02 ENCOUNTER — Other Ambulatory Visit: Payer: Self-pay | Admitting: Family Medicine

## 2018-12-09 ENCOUNTER — Other Ambulatory Visit: Payer: Self-pay | Admitting: Family Medicine

## 2019-01-02 ENCOUNTER — Other Ambulatory Visit: Payer: Self-pay | Admitting: Family Medicine

## 2019-01-05 DIAGNOSIS — L570 Actinic keratosis: Secondary | ICD-10-CM | POA: Diagnosis not present

## 2019-01-05 DIAGNOSIS — D225 Melanocytic nevi of trunk: Secondary | ICD-10-CM | POA: Diagnosis not present

## 2019-01-05 DIAGNOSIS — L814 Other melanin hyperpigmentation: Secondary | ICD-10-CM | POA: Diagnosis not present

## 2019-01-05 DIAGNOSIS — D0471 Carcinoma in situ of skin of right lower limb, including hip: Secondary | ICD-10-CM | POA: Diagnosis not present

## 2019-01-05 DIAGNOSIS — L821 Other seborrheic keratosis: Secondary | ICD-10-CM | POA: Diagnosis not present

## 2019-01-05 DIAGNOSIS — Z85828 Personal history of other malignant neoplasm of skin: Secondary | ICD-10-CM | POA: Diagnosis not present

## 2019-01-06 ENCOUNTER — Other Ambulatory Visit: Payer: Self-pay | Admitting: Family Medicine

## 2019-01-06 NOTE — Telephone Encounter (Signed)
Copied from [place] [phone]. Topic: Quick Communication - Rx Refill/Question >> Jan 06, 2019 12:38 PM Windy Kalata wrote: Medication: XARELTO 20 MG TABS tablet   Has the patient contacted their pharmacy? Yes.   (Agent: If no, request that the patient contact the pharmacy for the refill.) (Agent: If yes, when and what did the pharmacy advise?) Pharmacy never received the fax for refill  Preferred Pharmacy (with phone number or street name): [facility] [place], [address] AT [address] [phone] (Phone) [phone] (Fax)    Agent: Please be advised that RX refills may take up to 3 business days. We ask that you follow-up with your pharmacy.

## 2019-01-06 NOTE — Telephone Encounter (Signed)
Requested medication (s) are due for refill today: Yes  Requested medication (s) are on the active medication list: Yes  Last refill:  12/16/17  Future visit scheduled: Yes 01/08/19  Notes to clinic:  Unable to refill, expired     Requested Prescriptions  Pending Prescriptions Disp Refills   rivaroxaban (XARELTO) 20 MG TABS tablet 90 tablet 3     Hematology: Anticoagulants - rivaroxaban Failed - 01/06/2019 12:44 PM      Failed - ALT in normal range and within 180 days    ALT  Date Value Ref Range Status  12/11/2016 17 0 - 53 U/L Final         Failed - AST in normal range and within 180 days    AST  Date Value Ref Range Status  12/11/2016 20 0 - 37 U/L Final         Failed - Cr in normal range and within 360 days    Creatinine, Ser  Date Value Ref Range Status  12/11/2016 0.78 0.40 - 1.50 mg/dL Final         Failed - HCT in normal range and within 360 days    HCT  Date Value Ref Range Status  12/11/2016 38.5 (L) 39.0 - 52.0 % Final  01/18/2014 36.8 (L) 38.7 - 49.9 % Final         Failed - HGB in normal range and within 360 days    Hemoglobin  Date Value Ref Range Status  12/11/2016 13.1 13.0 - 17.0 g/dL Final   HGB  Date Value Ref Range Status  01/18/2014 12.4 (L) 13.0 - 17.1 g/dL Final         Failed - PLT in normal range and within 360 days    Platelets  Date Value Ref Range Status  12/11/2016 259.0 150.0 - 400.0 K/uL Final  01/18/2014 205 145 - 400 10e3/uL Final         Failed - Valid encounter within last 12 months    Recent Outpatient Visits          2 years ago Unilateral inguinal hernia without obstruction or gangrene, recurrence not specified   [facility], Ishmael Holter, MD   2 years ago Tinnitus, bilateral   [facility], Ishmael Holter, MD   3 years ago Memory loss   [employer], Ishmael Holter, MD   3 years ago Essential hypertension   [facility],  Ishmael Holter, MD   4 years ago Bilateral tinnitus   [facility], Ishmael Holter, MD      Future Appointments            In 2 days Laurey Morale, MD [employer] at [place], [facility]

## 2019-01-07 ENCOUNTER — Ambulatory Visit: Payer: Medicare Other | Admitting: Family Medicine

## 2019-01-08 ENCOUNTER — Encounter: Payer: Self-pay | Admitting: Family Medicine

## 2019-01-08 ENCOUNTER — Ambulatory Visit (INDEPENDENT_AMBULATORY_CARE_PROVIDER_SITE_OTHER): Payer: Medicare Other | Admitting: Family Medicine

## 2019-01-08 VITALS — BP 126/74 | HR 79 | Temp 98.9°F | Wt 159.4 lb

## 2019-01-08 DIAGNOSIS — R413 Other amnesia: Secondary | ICD-10-CM | POA: Diagnosis not present

## 2019-01-08 DIAGNOSIS — N401 Enlarged prostate with lower urinary tract symptoms: Secondary | ICD-10-CM

## 2019-01-08 DIAGNOSIS — I10 Essential (primary) hypertension: Secondary | ICD-10-CM

## 2019-01-08 DIAGNOSIS — K219 Gastro-esophageal reflux disease without esophagitis: Secondary | ICD-10-CM

## 2019-01-08 DIAGNOSIS — Z23 Encounter for immunization: Secondary | ICD-10-CM

## 2019-01-08 DIAGNOSIS — E782 Mixed hyperlipidemia: Secondary | ICD-10-CM

## 2019-01-08 DIAGNOSIS — M159 Polyosteoarthritis, unspecified: Secondary | ICD-10-CM

## 2019-01-08 DIAGNOSIS — R609 Edema, unspecified: Secondary | ICD-10-CM | POA: Diagnosis not present

## 2019-01-08 DIAGNOSIS — Z86718 Personal history of other venous thrombosis and embolism: Secondary | ICD-10-CM | POA: Insufficient documentation

## 2019-01-08 DIAGNOSIS — N138 Other obstructive and reflux uropathy: Secondary | ICD-10-CM

## 2019-01-08 LAB — BASIC METABOLIC PANEL
BUN: 18 mg/dL (ref 6–23)
CO2: 34 mEq/L — ABNORMAL HIGH (ref 19–32)
CREATININE: 0.79 mg/dL (ref 0.40–1.50)
Calcium: 10.9 mg/dL — ABNORMAL HIGH (ref 8.4–10.5)
Chloride: 99 mEq/L (ref 96–112)
GFR: 93.64 mL/min (ref >60.00)
Glucose, Bld: 83 mg/dL (ref 70–99)
Potassium: 3.3 mEq/L — ABNORMAL LOW (ref 3.5–5.1)
Sodium: 142 mEq/L (ref 135–145)

## 2019-01-08 LAB — HEPATIC FUNCTION PANEL
ALT: 9 U/L (ref 0–53)
AST: 17 U/L (ref 0–37)
Albumin: 3.9 g/dL (ref 3.5–5.2)
Alkaline Phosphatase: 63 U/L (ref 39–117)
Bilirubin, Direct: 0.2 mg/dL (ref 0.0–0.3)
Total Bilirubin: 0.9 mg/dL (ref 0.2–1.2)
Total Protein: 6.4 g/dL (ref 6.0–8.3)

## 2019-01-08 LAB — LIPID PANEL
Cholesterol: 197 mg/dL (ref 0–200)
HDL: 82.7 mg/dL (ref >39.00)
LDL Cholesterol: 106 mg/dL — ABNORMAL HIGH (ref 0–99)
NonHDL: 114.66
TRIGLYCERIDES: 45 mg/dL (ref 0.0–149.0)
Total CHOL/HDL Ratio: 2
VLDL: 9 mg/dL (ref 0.0–40.0)

## 2019-01-08 LAB — CBC WITH DIFFERENTIAL/PLATELET
Basophils Absolute: 0 10*3/uL (ref 0.0–0.1)
Basophils Relative: 0.5 % (ref 0.0–3.0)
Eosinophils Absolute: 0 10*3/uL (ref 0.0–0.7)
Eosinophils Relative: 0.2 % (ref 0.0–5.0)
HCT: 35.3 % — ABNORMAL LOW (ref 39.0–52.0)
Hemoglobin: 11.8 g/dL — ABNORMAL LOW (ref 13.0–17.0)
Lymphocytes Relative: 16.9 % (ref 12.0–46.0)
Lymphs Abs: 1.3 10*3/uL (ref 0.7–4.0)
MCHC: 33.6 g/dL (ref 30.0–36.0)
MCV: 97.9 fl (ref 78.0–100.0)
Monocytes Absolute: 1 10*3/uL (ref 0.1–1.0)
Monocytes Relative: 13 % — ABNORMAL HIGH (ref 3.0–12.0)
Neutro Abs: 5.4 10*3/uL (ref 1.4–7.7)
Neutrophils Relative %: 69.4 % (ref 43.0–77.0)
Platelets: 341 10*3/uL (ref 150.0–400.0)
RBC: 3.6 Mil/uL — AB (ref 4.22–5.81)
RDW: 13.3 % (ref 11.5–15.5)
WBC: 7.7 10*3/uL (ref 4.0–10.5)

## 2019-01-08 LAB — TSH: TSH: 2.77 u[IU]/mL (ref 0.35–4.50)

## 2019-01-08 MED ORDER — ATORVASTATIN CALCIUM 10 MG PO TABS: ORAL_TABLET | ORAL | 3 refills | Status: DC

## 2019-01-08 MED ORDER — SILODOSIN 8 MG PO CAPS: 8.0000 mg | ORAL_CAPSULE | Freq: Every day | ORAL | 3 refills | Status: DC

## 2019-01-08 MED ORDER — RIVAROXABAN 20 MG PO TABS: ORAL_TABLET | ORAL | 3 refills | Status: DC

## 2019-01-08 MED ORDER — PANTOPRAZOLE SODIUM 40 MG PO TBEC: DELAYED_RELEASE_TABLET | ORAL | 3 refills | Status: DC

## 2019-01-08 MED ORDER — FINASTERIDE 5 MG PO TABS: ORAL_TABLET | ORAL | 3 refills | Status: DC

## 2019-01-08 NOTE — Progress Notes (Signed)
   Subjective:    Patient ID: [name], male    DOB: 04-Oct-1933, 84 y.o.   MRN: [id number]  HPI Here with his daughter to follow up on issues, for refills, and to fill out forms to allow him to move into [facility]. He and his wife plan to move into an assisted living cottage there. They need forms to be filled out about his general health and he needs  2 step TB testing. Frankey Poot feels good in general. His knees bother him a lot however and he has had to give up golf. His BP is stable. No swelling in the legs. His memory loss seems to be stable. No GERD problems. He has lost about 20 lbs in the past 4 months, but he says he has been trying to lose weight by eating smaller portions.    Review of Systems  Constitutional: Negative.   Respiratory: Negative.   Cardiovascular: Negative.   Gastrointestinal: Negative.   Genitourinary: Positive for frequency. Negative for difficulty urinating and dysuria.  Musculoskeletal: Positive for arthralgias.  Neurological: Negative for dizziness, tremors, seizures, syncope, facial asymmetry, speech difficulty, weakness and numbness.       Objective:   Physical Exam Constitutional:      Comments: He walks very stooped over   Cardiovascular:     Rate and Rhythm: Normal rate and regular rhythm.     Pulses: Normal pulses.     Heart sounds: Normal heart sounds.  Pulmonary:     Effort: Pulmonary effort is normal.     Breath sounds: Normal breath sounds.  Abdominal:     General: Abdomen is flat. Bowel sounds are normal. There is no distension.     Palpations: Abdomen is soft. There is no mass.     Tenderness: There is no abdominal tenderness. There is no guarding or rebound.     Hernia: No hernia is present.  Musculoskeletal:        General: No swelling.     Right lower leg: No edema.     Left lower leg: No edema.  Lymphadenopathy:     Cervical: No cervical adenopathy.  Neurological:     General: No focal deficit present.     Mental Status: He  is alert and oriented to person, place, and time. Mental status is at baseline.  Psychiatric:        Mood and Affect: Mood normal.        Behavior: Behavior normal.        Thought Content: Thought content normal.           Assessment & Plan:  He seems to be doing well in general. We will get fasting labs today. We agreed that he will return next week for [place] to place the first PPD. We will do a repeat PPD a few weeks after that. We will complete the forms for [facility] as well.  Alysia Penna, MD

## 2019-01-12 ENCOUNTER — Ambulatory Visit: Payer: Medicare Other

## 2019-01-14 MED ORDER — POTASSIUM CHLORIDE ER 10 MEQ PO TBCR: 10.0000 meq | EXTENDED_RELEASE_TABLET | Freq: Every day | ORAL | 3 refills | Status: DC

## 2019-01-14 NOTE — Addendum Note (Signed)
Addended by: Agnes Lawrence on: 01/14/2019 02:53 PM   Modules accepted: Orders

## 2019-03-05 ENCOUNTER — Other Ambulatory Visit: Payer: Self-pay | Admitting: Family Medicine

## 2019-04-29 DIAGNOSIS — R350 Frequency of micturition: Secondary | ICD-10-CM | POA: Diagnosis not present

## 2019-04-29 DIAGNOSIS — R3912 Poor urinary stream: Secondary | ICD-10-CM | POA: Diagnosis not present

## 2019-05-07 ENCOUNTER — Telehealth: Payer: Self-pay | Admitting: *Deleted

## 2019-05-07 NOTE — Telephone Encounter (Signed)
Copied from [place] [phone]. Topic: General - Inquiry >> May 07, 2019  1:43 PM Scherrie Gerlach wrote: Reason for CRM:  Remo Lipps, daughter following up on the paperwork for [facility]. (left with Dr Sarajane Jews 01/08/19 at appt)  Sena[facility] Lange called today and they have an apartment for the pt that is ready.  All they need now is paperwork.  [facility] no longer requiring PPD testing.  If need, they will do onsight.  Remo Lipps states the pandemic happened after this appt, and they never were able to make follow up appt, or pick up paperwork. Please advise if you still have, and she will pick up and needs asap.

## 2019-05-19 NOTE — Telephone Encounter (Signed)
Called and spoke with [name] and she will come by and pick the papers up for the pt.  I have made a copy of these forms and placed in the scan folder.

## 2019-05-19 NOTE — Telephone Encounter (Signed)
Patient daughter called and would like to speak with Dr. Sarajane Jews CMA about the paperwork she is waiting on. Please call her back today, thanks.

## 2019-05-24 ENCOUNTER — Other Ambulatory Visit: Payer: Self-pay | Admitting: Family Medicine

## 2019-05-27 DIAGNOSIS — R600 Localized edema: Secondary | ICD-10-CM | POA: Diagnosis not present

## 2019-05-27 DIAGNOSIS — I83893 Varicose veins of bilateral lower extremities with other complications: Secondary | ICD-10-CM | POA: Diagnosis not present

## 2019-07-07 DIAGNOSIS — L720 Epidermal cyst: Secondary | ICD-10-CM | POA: Diagnosis not present

## 2019-07-07 DIAGNOSIS — L570 Actinic keratosis: Secondary | ICD-10-CM | POA: Diagnosis not present

## 2019-07-07 DIAGNOSIS — C44729 Squamous cell carcinoma of skin of left lower limb, including hip: Secondary | ICD-10-CM | POA: Diagnosis not present

## 2019-07-07 DIAGNOSIS — L821 Other seborrheic keratosis: Secondary | ICD-10-CM | POA: Diagnosis not present

## 2019-07-07 DIAGNOSIS — Z85828 Personal history of other malignant neoplasm of skin: Secondary | ICD-10-CM | POA: Diagnosis not present

## 2019-07-07 DIAGNOSIS — D0462 Carcinoma in situ of skin of left upper limb, including shoulder: Secondary | ICD-10-CM | POA: Diagnosis not present

## 2019-07-07 DIAGNOSIS — C44329 Squamous cell carcinoma of skin of other parts of face: Secondary | ICD-10-CM | POA: Diagnosis not present

## 2019-07-07 DIAGNOSIS — D0439 Carcinoma in situ of skin of other parts of face: Secondary | ICD-10-CM | POA: Diagnosis not present

## 2019-07-07 DIAGNOSIS — D692 Other nonthrombocytopenic purpura: Secondary | ICD-10-CM | POA: Diagnosis not present

## 2019-07-07 NOTE — Telephone Encounter (Signed)
Patient came and picked up forms. I made a copy to send to send and a copy to send to HIM for the medical records release

## 2019-08-27 DIAGNOSIS — Z23 Encounter for immunization: Secondary | ICD-10-CM | POA: Diagnosis not present

## 2019-11-24 DIAGNOSIS — Z23 Encounter for immunization: Secondary | ICD-10-CM | POA: Diagnosis not present

## 2019-12-07 ENCOUNTER — Other Ambulatory Visit: Payer: Self-pay | Admitting: Family Medicine

## 2019-12-22 DIAGNOSIS — Z23 Encounter for immunization: Secondary | ICD-10-CM | POA: Diagnosis not present

## 2020-01-04 ENCOUNTER — Other Ambulatory Visit: Payer: Self-pay

## 2020-01-04 MED ORDER — POTASSIUM CHLORIDE ER 10 MEQ PO TBCR: 10.0000 meq | EXTENDED_RELEASE_TABLET | Freq: Every day | ORAL | 0 refills | Status: DC

## 2020-03-06 ENCOUNTER — Other Ambulatory Visit: Payer: Self-pay | Admitting: Family Medicine

## 2020-04-09 ENCOUNTER — Other Ambulatory Visit: Payer: Self-pay | Admitting: Family Medicine

## 2020-04-26 DIAGNOSIS — L570 Actinic keratosis: Secondary | ICD-10-CM | POA: Diagnosis not present

## 2020-04-26 DIAGNOSIS — L820 Inflamed seborrheic keratosis: Secondary | ICD-10-CM | POA: Diagnosis not present

## 2020-04-26 DIAGNOSIS — C44619 Basal cell carcinoma of skin of left upper limb, including shoulder: Secondary | ICD-10-CM | POA: Diagnosis not present

## 2020-04-26 DIAGNOSIS — D225 Melanocytic nevi of trunk: Secondary | ICD-10-CM | POA: Diagnosis not present

## 2020-04-26 DIAGNOSIS — Z85828 Personal history of other malignant neoplasm of skin: Secondary | ICD-10-CM | POA: Diagnosis not present

## 2020-04-26 DIAGNOSIS — L821 Other seborrheic keratosis: Secondary | ICD-10-CM | POA: Diagnosis not present

## 2020-04-26 DIAGNOSIS — L720 Epidermal cyst: Secondary | ICD-10-CM | POA: Diagnosis not present

## 2020-05-28 ENCOUNTER — Other Ambulatory Visit: Payer: Self-pay | Admitting: Family Medicine

## 2020-05-31 NOTE — Telephone Encounter (Signed)
Patient need to schedule an ov for more refills. Rx filled for 30 days.

## 2020-06-15 ENCOUNTER — Encounter (HOSPITAL_COMMUNITY): Payer: Self-pay

## 2020-06-15 ENCOUNTER — Other Ambulatory Visit: Payer: Self-pay

## 2020-06-15 ENCOUNTER — Inpatient Hospital Stay (HOSPITAL_COMMUNITY)
Admission: EM | Admit: 2020-06-15 | Discharge: 2020-06-18 | DRG: 641 | Disposition: A | Discharge status: Home / Self Care | Payer: Medicare Other | Attending: Family Medicine | Admitting: Family Medicine

## 2020-06-15 DIAGNOSIS — F0390 Unspecified dementia without behavioral disturbance: Secondary | ICD-10-CM | POA: Diagnosis present

## 2020-06-15 DIAGNOSIS — Z96651 Presence of right artificial knee joint: Secondary | ICD-10-CM | POA: Diagnosis present

## 2020-06-15 DIAGNOSIS — R413 Other amnesia: Secondary | ICD-10-CM | POA: Diagnosis not present

## 2020-06-15 DIAGNOSIS — E876 Hypokalemia: Secondary | ICD-10-CM | POA: Diagnosis not present

## 2020-06-15 DIAGNOSIS — D649 Anemia, unspecified: Secondary | ICD-10-CM

## 2020-06-15 DIAGNOSIS — E210 Primary hyperparathyroidism: Secondary | ICD-10-CM | POA: Diagnosis not present

## 2020-06-15 DIAGNOSIS — F17210 Nicotine dependence, cigarettes, uncomplicated: Secondary | ICD-10-CM | POA: Diagnosis present

## 2020-06-15 DIAGNOSIS — J302 Other seasonal allergic rhinitis: Secondary | ICD-10-CM | POA: Diagnosis present

## 2020-06-15 DIAGNOSIS — E785 Hyperlipidemia, unspecified: Secondary | ICD-10-CM | POA: Diagnosis not present

## 2020-06-15 DIAGNOSIS — Z86010 Personal history of colonic polyps: Secondary | ICD-10-CM

## 2020-06-15 DIAGNOSIS — Z79899 Other long term (current) drug therapy: Secondary | ICD-10-CM

## 2020-06-15 DIAGNOSIS — N400 Enlarged prostate without lower urinary tract symptoms: Secondary | ICD-10-CM | POA: Diagnosis present

## 2020-06-15 DIAGNOSIS — K5730 Diverticulosis of large intestine without perforation or abscess without bleeding: Secondary | ICD-10-CM | POA: Diagnosis present

## 2020-06-15 DIAGNOSIS — Z20822 Contact with and (suspected) exposure to covid-19: Secondary | ICD-10-CM | POA: Diagnosis present

## 2020-06-15 DIAGNOSIS — K222 Esophageal obstruction: Secondary | ICD-10-CM

## 2020-06-15 DIAGNOSIS — Z7901 Long term (current) use of anticoagulants: Secondary | ICD-10-CM

## 2020-06-15 DIAGNOSIS — K219 Gastro-esophageal reflux disease without esophagitis: Secondary | ICD-10-CM | POA: Diagnosis not present

## 2020-06-15 DIAGNOSIS — Z86718 Personal history of other venous thrombosis and embolism: Secondary | ICD-10-CM

## 2020-06-15 DIAGNOSIS — M170 Bilateral primary osteoarthritis of knee: Secondary | ICD-10-CM | POA: Diagnosis not present

## 2020-06-15 DIAGNOSIS — Z85828 Personal history of other malignant neoplasm of skin: Secondary | ICD-10-CM

## 2020-06-15 DIAGNOSIS — Z8719 Personal history of other diseases of the digestive system: Secondary | ICD-10-CM

## 2020-06-15 DIAGNOSIS — R54 Age-related physical debility: Secondary | ICD-10-CM | POA: Diagnosis not present

## 2020-06-15 DIAGNOSIS — D500 Iron deficiency anemia secondary to blood loss (chronic): Secondary | ICD-10-CM | POA: Diagnosis present

## 2020-06-15 DIAGNOSIS — I10 Essential (primary) hypertension: Secondary | ICD-10-CM | POA: Diagnosis present

## 2020-06-15 DIAGNOSIS — R634 Abnormal weight loss: Secondary | ICD-10-CM | POA: Diagnosis not present

## 2020-06-15 DIAGNOSIS — Z6822 Body mass index (BMI) 22.0-22.9, adult: Secondary | ICD-10-CM | POA: Diagnosis not present

## 2020-06-15 DIAGNOSIS — K4090 Unilateral inguinal hernia, without obstruction or gangrene, not specified as recurrent: Secondary | ICD-10-CM | POA: Diagnosis present

## 2020-06-15 DIAGNOSIS — Z86711 Personal history of pulmonary embolism: Secondary | ICD-10-CM

## 2020-06-15 DIAGNOSIS — R001 Bradycardia, unspecified: Secondary | ICD-10-CM | POA: Diagnosis not present

## 2020-06-15 LAB — COMPREHENSIVE METABOLIC PANEL
ALT: 12 U/L (ref 0–44)
AST: 18 U/L (ref 15–41)
Albumin: 3.5 g/dL (ref 3.5–5.0)
Alkaline Phosphatase: 70 U/L (ref 38–126)
Anion gap: 14 (ref 5–15)
BUN: 14 mg/dL (ref 8–23)
CO2: 33 mmol/L — ABNORMAL HIGH (ref 22–32)
Calcium: 11.2 mg/dL — ABNORMAL HIGH (ref 8.9–10.3)
Chloride: 94 mmol/L — ABNORMAL LOW (ref 98–111)
Creatinine, Ser: 0.98 mg/dL (ref 0.61–1.24)
GFR calc Af Amer: >60 mL/min (ref >60)
GFR calc non Af Amer: >60 mL/min (ref >60)
Glucose, Bld: 101 mg/dL — ABNORMAL HIGH (ref 70–99)
Potassium: 2.5 mmol/L — CL (ref 3.5–5.1)
Sodium: 141 mmol/L (ref 135–145)
Total Bilirubin: 0.3 mg/dL (ref 0.3–1.2)
Total Protein: 6.6 g/dL (ref 6.5–8.1)

## 2020-06-15 LAB — CBC
HCT: 20.5 % — ABNORMAL LOW (ref 39.0–52.0)
Hemoglobin: 6.2 g/dL — CL (ref 13.0–17.0)
MCH: 23.3 pg — ABNORMAL LOW (ref 26.0–34.0)
MCHC: 30.2 g/dL (ref 30.0–36.0)
MCV: 77.1 fL — ABNORMAL LOW (ref 80.0–100.0)
Platelets: 315 10*3/uL (ref 150–400)
RBC: 2.66 MIL/uL — ABNORMAL LOW (ref 4.22–5.81)
RDW: 17.2 % — ABNORMAL HIGH (ref 11.5–15.5)
WBC: 5.1 10*3/uL (ref 4.0–10.5)
nRBC: 0 % (ref 0.0–0.2)

## 2020-06-15 NOTE — ED Triage Notes (Signed)
Pt sent from PCP for abnormal labs

## 2020-06-15 NOTE — ED Notes (Signed)
Date and time results received: 06/15/20 2356 (use smartphrase ".now" to insert current time)  Test: K+ Critical Value: 2.5  Name of Provider Notified: Palumbo md  Orders Received? Or Actions Taken?: waiting on orders.

## 2020-06-16 ENCOUNTER — Encounter (HOSPITAL_COMMUNITY): Payer: Self-pay | Admitting: Emergency Medicine

## 2020-06-16 DIAGNOSIS — E785 Hyperlipidemia, unspecified: Secondary | ICD-10-CM | POA: Diagnosis present

## 2020-06-16 DIAGNOSIS — Z800 Family history of malignant neoplasm of digestive organs: Secondary | ICD-10-CM

## 2020-06-16 DIAGNOSIS — N400 Enlarged prostate without lower urinary tract symptoms: Secondary | ICD-10-CM | POA: Diagnosis present

## 2020-06-16 DIAGNOSIS — Z79899 Other long term (current) drug therapy: Secondary | ICD-10-CM | POA: Diagnosis not present

## 2020-06-16 DIAGNOSIS — Z7901 Long term (current) use of anticoagulants: Secondary | ICD-10-CM

## 2020-06-16 DIAGNOSIS — Z20822 Contact with and (suspected) exposure to covid-19: Secondary | ICD-10-CM | POA: Diagnosis present

## 2020-06-16 DIAGNOSIS — D649 Anemia, unspecified: Secondary | ICD-10-CM | POA: Diagnosis not present

## 2020-06-16 DIAGNOSIS — F17210 Nicotine dependence, cigarettes, uncomplicated: Secondary | ICD-10-CM | POA: Diagnosis present

## 2020-06-16 DIAGNOSIS — Z85828 Personal history of other malignant neoplasm of skin: Secondary | ICD-10-CM | POA: Diagnosis not present

## 2020-06-16 DIAGNOSIS — K5730 Diverticulosis of large intestine without perforation or abscess without bleeding: Secondary | ICD-10-CM | POA: Diagnosis present

## 2020-06-16 DIAGNOSIS — K219 Gastro-esophageal reflux disease without esophagitis: Secondary | ICD-10-CM | POA: Diagnosis present

## 2020-06-16 DIAGNOSIS — Z86718 Personal history of other venous thrombosis and embolism: Secondary | ICD-10-CM

## 2020-06-16 DIAGNOSIS — Z96651 Presence of right artificial knee joint: Secondary | ICD-10-CM | POA: Diagnosis present

## 2020-06-16 DIAGNOSIS — E876 Hypokalemia: Secondary | ICD-10-CM | POA: Diagnosis present

## 2020-06-16 DIAGNOSIS — D500 Iron deficiency anemia secondary to blood loss (chronic): Secondary | ICD-10-CM | POA: Diagnosis present

## 2020-06-16 DIAGNOSIS — F0390 Unspecified dementia without behavioral disturbance: Secondary | ICD-10-CM | POA: Diagnosis present

## 2020-06-16 DIAGNOSIS — Z8719 Personal history of other diseases of the digestive system: Secondary | ICD-10-CM | POA: Diagnosis not present

## 2020-06-16 DIAGNOSIS — I10 Essential (primary) hypertension: Secondary | ICD-10-CM | POA: Diagnosis present

## 2020-06-16 DIAGNOSIS — Z86711 Personal history of pulmonary embolism: Secondary | ICD-10-CM | POA: Diagnosis not present

## 2020-06-16 DIAGNOSIS — D509 Iron deficiency anemia, unspecified: Secondary | ICD-10-CM

## 2020-06-16 DIAGNOSIS — J302 Other seasonal allergic rhinitis: Secondary | ICD-10-CM | POA: Diagnosis present

## 2020-06-16 DIAGNOSIS — K222 Esophageal obstruction: Secondary | ICD-10-CM | POA: Diagnosis not present

## 2020-06-16 DIAGNOSIS — E210 Primary hyperparathyroidism: Secondary | ICD-10-CM | POA: Diagnosis present

## 2020-06-16 DIAGNOSIS — Z86010 Personal history of colonic polyps: Secondary | ICD-10-CM | POA: Diagnosis not present

## 2020-06-16 DIAGNOSIS — K4090 Unilateral inguinal hernia, without obstruction or gangrene, not specified as recurrent: Secondary | ICD-10-CM | POA: Diagnosis present

## 2020-06-16 LAB — CBC
HCT: 26.7 % — ABNORMAL LOW (ref 39.0–52.0)
HCT: 26.3 % — ABNORMAL LOW (ref 39.0–52.0)
Hemoglobin: 8.3 g/dL — ABNORMAL LOW (ref 13.0–17.0)
Hemoglobin: 8.1 g/dL — ABNORMAL LOW (ref 13.0–17.0)
MCH: 24.4 pg — ABNORMAL LOW (ref 26.0–34.0)
MCH: 24.3 pg — ABNORMAL LOW (ref 26.0–34.0)
MCHC: 31.1 g/dL (ref 30.0–36.0)
MCHC: 30.8 g/dL (ref 30.0–36.0)
MCV: 78.5 fL — ABNORMAL LOW (ref 80.0–100.0)
MCV: 79 fL — ABNORMAL LOW (ref 80.0–100.0)
Platelets: 276 10*3/uL (ref 150–400)
Platelets: 287 10*3/uL (ref 150–400)
RBC: 3.4 MIL/uL — ABNORMAL LOW (ref 4.22–5.81)
RBC: 3.33 MIL/uL — ABNORMAL LOW (ref 4.22–5.81)
RDW: 16.6 % — ABNORMAL HIGH (ref 11.5–15.5)
RDW: 16.7 % — ABNORMAL HIGH (ref 11.5–15.5)
WBC: 6.2 10*3/uL (ref 4.0–10.5)
WBC: 6 10*3/uL (ref 4.0–10.5)
nRBC: 0 % (ref 0.0–0.2)
nRBC: 0 % (ref 0.0–0.2)

## 2020-06-16 LAB — BASIC METABOLIC PANEL
Anion gap: 8 (ref 5–15)
Anion gap: 8 (ref 5–15)
BUN: 13 mg/dL (ref 8–23)
BUN: 11 mg/dL (ref 8–23)
CO2: 29 mmol/L (ref 22–32)
CO2: 28 mmol/L (ref 22–32)
Calcium: 10.3 mg/dL (ref 8.9–10.3)
Calcium: 10.3 mg/dL (ref 8.9–10.3)
Chloride: 101 mmol/L (ref 98–111)
Chloride: 103 mmol/L (ref 98–111)
Creatinine, Ser: 0.81 mg/dL (ref 0.61–1.24)
Creatinine, Ser: 0.77 mg/dL (ref 0.61–1.24)
GFR calc Af Amer: >60 mL/min (ref >60)
GFR calc Af Amer: >60 mL/min (ref >60)
GFR calc non Af Amer: >60 mL/min (ref >60)
GFR calc non Af Amer: >60 mL/min (ref >60)
Glucose, Bld: 95 mg/dL (ref 70–99)
Glucose, Bld: 96 mg/dL (ref 70–99)
Potassium: 3.3 mmol/L — ABNORMAL LOW (ref 3.5–5.1)
Potassium: 3.3 mmol/L — ABNORMAL LOW (ref 3.5–5.1)
Sodium: 138 mmol/L (ref 135–145)
Sodium: 139 mmol/L (ref 135–145)

## 2020-06-16 LAB — RETICULOCYTES
Immature Retic Fract: 23.5 % — ABNORMAL HIGH (ref 2.3–15.9)
RBC.: 3.3 MIL/uL — ABNORMAL LOW (ref 4.22–5.81)
Retic Count, Absolute: 42.6 10*3/uL (ref 19.0–186.0)
Retic Ct Pct: 1.3 % (ref 0.4–3.1)

## 2020-06-16 LAB — VITAMIN B12: Vitamin B-12: 380 pg/mL (ref 180–914)

## 2020-06-16 LAB — MAGNESIUM: Magnesium: 1.2 mg/dL — ABNORMAL LOW (ref 1.7–2.4)

## 2020-06-16 LAB — PROTIME-INR
INR: 1.1 (ref 0.8–1.2)
Prothrombin Time: 13.9 seconds (ref 11.4–15.2)

## 2020-06-16 LAB — SARS CORONAVIRUS 2 BY RT PCR (HOSPITAL ORDER, PERFORMED IN ~~LOC~~ HOSPITAL LAB): SARS Coronavirus 2: NEGATIVE

## 2020-06-16 LAB — IRON AND TIBC
Iron: 63 ug/dL (ref 45–182)
Saturation Ratios: 17 % — ABNORMAL LOW (ref 17.9–39.5)
TIBC: 381 ug/dL (ref 250–450)
UIBC: 318 ug/dL

## 2020-06-16 LAB — PREPARE RBC (CROSSMATCH)

## 2020-06-16 LAB — PHOSPHORUS: Phosphorus: 1.9 mg/dL — ABNORMAL LOW (ref 2.5–4.6)

## 2020-06-16 LAB — POC OCCULT BLOOD, ED
Fecal Occult Bld: NEGATIVE
Fecal Occult Bld: NEGATIVE

## 2020-06-16 LAB — FERRITIN: Ferritin: 9 ng/mL — ABNORMAL LOW (ref 24–336)

## 2020-06-16 LAB — FOLATE: Folate: 26.8 ng/mL (ref >5.9)

## 2020-06-16 MED ORDER — ONDANSETRON HCL 4 MG PO TABS: 4.0000 mg | ORAL_TABLET | Freq: Four times a day (QID) | ORAL | Status: DC | PRN

## 2020-06-16 MED ORDER — POTASSIUM CHLORIDE 10 MEQ/100ML IV SOLN
10.0000 meq | INTRAVENOUS | Status: AC
  Administered 2020-06-16 (×6): 10 meq via INTRAVENOUS
  Filled 2020-06-16 (×6): qty 100

## 2020-06-16 MED ORDER — PEG-KCL-NACL-NASULF-NA ASC-C 100 G PO SOLR: 1.0000 | Freq: Once | ORAL | Status: DC

## 2020-06-16 MED ORDER — MAGNESIUM SULFATE 2 GM/50ML IV SOLN
2.0000 g | Freq: Once | INTRAVENOUS | Status: AC
  Administered 2020-06-16: 2 g via INTRAVENOUS
  Filled 2020-06-16: qty 50

## 2020-06-16 MED ORDER — PANTOPRAZOLE SODIUM 40 MG IV SOLR
40.0000 mg | INTRAVENOUS | Status: DC
  Administered 2020-06-16 – 2020-06-18 (×3): 40 mg via INTRAVENOUS
  Filled 2020-06-16 (×3): qty 40

## 2020-06-16 MED ORDER — POTASSIUM PHOSPHATES 15 MMOLE/5ML IV SOLN
20.0000 mmol | Freq: Once | INTRAVENOUS | Status: AC
  Administered 2020-06-16: 20 mmol via INTRAVENOUS
  Filled 2020-06-16: qty 6.67

## 2020-06-16 MED ORDER — PEG-KCL-NACL-NASULF-NA ASC-C 100 G PO SOLR
0.5000 | Freq: Once | ORAL | Status: AC
  Administered 2020-06-16: 100 g via ORAL
  Filled 2020-06-16: qty 1

## 2020-06-16 MED ORDER — SODIUM CHLORIDE 0.9% IV SOLUTION: Freq: Once | INTRAVENOUS | Status: AC

## 2020-06-16 MED ORDER — PEG-KCL-NACL-NASULF-NA ASC-C 100 G PO SOLR
0.5000 | Freq: Once | ORAL | Status: AC
  Administered 2020-06-16: 100 g via ORAL

## 2020-06-16 MED ORDER — ONDANSETRON HCL 4 MG/2ML IJ SOLN: 4.0000 mg | Freq: Four times a day (QID) | INTRAMUSCULAR | Status: DC | PRN

## 2020-06-16 MED ORDER — POTASSIUM CHLORIDE CRYS ER 20 MEQ PO TBCR
40.0000 meq | EXTENDED_RELEASE_TABLET | Freq: Once | ORAL | Status: AC
  Administered 2020-06-16: 40 meq via ORAL
  Filled 2020-06-16: qty 2

## 2020-06-16 MED ORDER — POTASSIUM CHLORIDE CRYS ER 20 MEQ PO TBCR
40.0000 meq | EXTENDED_RELEASE_TABLET | ORAL | Status: AC
  Administered 2020-06-16 (×2): 40 meq via ORAL
  Filled 2020-06-16: qty 4
  Filled 2020-06-16: qty 2

## 2020-06-16 MED ORDER — ACETAMINOPHEN 325 MG PO TABS: 650.0000 mg | ORAL_TABLET | Freq: Four times a day (QID) | ORAL | Status: DC | PRN

## 2020-06-16 MED ORDER — ACETAMINOPHEN 650 MG RE SUPP: 650.0000 mg | Freq: Four times a day (QID) | RECTAL | Status: DC | PRN

## 2020-06-16 NOTE — H&P (Signed)
History and Physical    [name] ZOX:[id number] DOB: 11/18/1933 DOA: 06/15/2020  PCP: Loraine Leriche., MD  Patient coming from: Home.  I have personally briefly reviewed patient's old medical records in [place]  Chief Complaint: Abnormal lab results.  HPI: [name] is a 86 y.o. male with medical history significant of seasonal allergies, BPH, diverticulosis, history of DVT, history of pulmonary embolism, rectal dysfunction, GERD, headache, hypercalcemia, hyperparathyroidism, hypertension, history of sciatica, osteoarthritis, hyperlipidemia, colon polyps who is coming to the emergency department referred by his physician due to abnormal blood tests.  The history is mostly taken from the patient's daughter given his memory loss issues.  He denies headache, chest pain, abdominal pain or any other acute discomfort.  ED Course: Initial vital signs were temperature 98 F, pulse 64, respiration 18, blood pressure 120/64 mmHg and O2 sat 98% on room air.  The patient received 40 mEq of oral potassium, 10 mEq IVPB x6 and 2 units of PRBC.  CBC showed a white count of 5.1, hemoglobin 6.2 g/dL and platelets 315.  CMP shows sodium of 141, potassium 2.5, chloride 94 and CO2 33 mmol/L.  Renal and hepatic functions were normal.  His calcium was 11.2 mg/dL (has a history of primary parathyroidism).  His magnesium level is 1.2 mg/dL.  Review of Systems: As per HPI otherwise all other systems reviewed and are negative.  Past Medical History:  Diagnosis Date  . Allergy   . BPH (benign prostatic hypertrophy)   . Diverticulosis   . DVT of lower extremity (deep venous thrombosis) (HCC)    right leg, 01-10-11. sees Dr. Linus Mako   . ED (erectile dysfunction)   . GERD (gastroesophageal reflux disease)   . Headache(784.0)   . Hypercalcemia    sees Dr Loanne Drilling  . Hyperparathyroidism ([place])   . Hypertension   . Neuromuscular disorder (HCC)    sciatica  . Normal cardiac  stress test    10-29-08  . Osteoarthritis   . Other and unspecified hyperlipidemia   . Personal history of colonic polyps   . Pulmonary embolism ([place])    01-10-11   Past Surgical History:  Procedure Laterality Date  . BASAL CELL CARCINOMA EXCISION     removed face back Dr Sherrye Payor, had MOHS Dr Sarajane Jews  . COLONOSCOPY  09-16-13   per Dr. Deatra Ina, benign polyps and severe diverticulosis, no repeats   . ENDOVENOUS ABLATION SAPHENOUS VEIN W/ LASER Bilateral 05-28-13   per Dr. Renaldo Reel   . HERNIA REPAIR     Inguinal right Dr Rise Patience  3-05  . JOINT REPLACEMENT     rt knee replacement 2003 Dr Wynelle Link  . KNEE ARTHROSCOPY     rt knee  . KNEE ARTHROSCOPY     left knee 2011 Dr Wynelle Link   . TONSILLECTOMY    . TOTAL KNEE ARTHROPLASTY  12/08/2012   Procedure: TOTAL KNEE ARTHROPLASTY;  Surgeon: [name], MD;  Location: [facility];  Service: Orthopedics;  Laterality: Left;  . vasectomy     Social History  reports that he quit smoking about 58 years ago. His smoking use included cigarettes. He started smoking about 65 years ago. He has a 3.50 pack-year smoking history. He has never used smokeless tobacco. He reports current alcohol use of about 14.0 standard drinks of alcohol per week. He reports that he does not use drugs.  No Known Allergies  Family History  Problem Relation Age of Onset  . Heart disease Mother   .  Colon cancer Father   . Arthritis Other   . Cancer Other        Colon 1st degree relative <60  . Pancreatic cancer Neg Hx   . Stomach cancer Neg Hx    Prior to Admission medications   Medication Sig Start Date End Date Taking? Authorizing Provider  atorvastatin (LIPITOR) 10 MG tablet TAKE 1 TABLET(10 MG) BY MOUTH DAILY Patient taking differently: Take 10 mg by mouth daily.  03/07/20  Yes Laurey Morale, MD  b complex vitamins capsule Take 1 capsule by mouth daily.   Yes [provider]  cholecalciferol (VITAMIN D3) 25 MCG (1000 UNIT) tablet Take 1,000 Units by  mouth daily.   Yes [provider]  diclofenac sodium (VOLTAREN) 1 % GEL Apply topically daily.   Yes [provider]  donepezil (ARICEPT) 10 MG tablet TAKE 1 TABLET(10 MG) BY MOUTH AT BEDTIME Patient taking differently: Take 10 mg by mouth at bedtime.  12/07/19  Yes Laurey Morale, MD  finasteride (PROSCAR) 5 MG tablet TAKE 1 TABLET(5 MG) BY MOUTH DAILY Patient taking differently: Take 5 mg by mouth daily.  01/08/19  Yes Laurey Morale, MD  furosemide (LASIX) 40 MG tablet TAKE 1 TABLET(40 MG) BY MOUTH DAILY BEFORE BREAKFAST Patient taking differently: Take 40 mg by mouth daily.  12/07/19  Yes Laurey Morale, MD  memantine (NAMENDA) 10 MG tablet TAKE 1 TABLET(10 MG) BY MOUTH TWICE DAILY Patient taking differently: Take 10 mg by mouth 2 (two) times daily.  05/31/20  Yes Laurey Morale, MD  Multiple Vitamin (MULTIVITAMIN WITH MINERALS) TABS tablet Take 1 tablet by mouth daily.   Yes [provider]  pantoprazole (PROTONIX) 40 MG tablet TAKE 1 TABLET(40 MG) BY MOUTH TWICE DAILY Patient taking differently: Take 40 mg by mouth 2 (two) times daily.  01/08/19  Yes Laurey Morale, MD  silodosin (RAPAFLO) 8 MG CAPS capsule Take 1 capsule (8 mg total) by mouth daily with breakfast. 01/08/19  Yes Laurey Morale, MD  XARELTO 20 MG TABS tablet TAKE 1 TABLET(20 MG) BY MOUTH DAILY WITH SUPPER Patient taking differently: Take 20 mg by mouth daily with supper.  12/07/19  Yes Laurey Morale, MD  fluticasone (FLONASE) 50 MCG/ACT nasal spray SHAKE LIQUID AND USE 2 SPRAYS IN [facility] NOSTRIL EVERY DAY Patient not taking: Reported on 06/16/2020 06/24/17   Laurey Morale, MD  fluticasone [facility]) 50 MCG/ACT nasal spray SHAKE LIQUID AND USE 2 SPRAYS IN [facility] NOSTRIL EVERY DAY Patient not taking: Reported on 06/16/2020 12/20/17   Laurey Morale, MD  potassium chloride (KLOR-CON 10) 10 MEQ tablet Take 1 tablet (10 mEq total) by mouth daily. Patient needs an appointment for further refills. Patient not taking:  Reported on 06/16/2020 01/04/20   Laurey Morale, MD   Physical Exam: Vitals:   06/15/20 2301 06/16/20 0045  BP: 120/64 (!) 126/109  Pulse: 64 61  Resp: 18 (!) 21  Temp: 98 F (36.7 C)   TempSrc: Oral   SpO2: 98% 99%   Constitutional: NAD, calm, comfortable Eyes: PERRL, lids and conjunctivae are pale. ENMT: Mucous membranes are moist. Posterior pharynx clear of any exudate or lesions. Neck: normal, supple, no masses, no thyromegaly Respiratory: clear to auscultation bilaterally, no wheezing, no crackles. Normal respiratory effort. No accessory muscle use.  Cardiovascular: Regular rate and rhythm, no murmurs / rubs / gallops.  2+ lower extremity pitting edema. 2+ pedal pulses. No carotid bruits.  Abdomen: Non distended.  BS positive.  Soft, no tenderness, no masses palpated. No hepatosplenomegaly.  Musculoskeletal: Generalized weakness, no clubbing / cyanosis.  Good ROM, no contractures. Normal muscle tone.  Skin: Areas of ecchymosis on extremities Neurologic: CN 2-12 grossly intact. Sensation intact, DTR normal. Strength 5/5 in all 4.  Psychiatric: Alert, oriented x1.  Had to be reminded that he is in the hospital and the reason why.  His daughter assisted during HPI questions.  Labs on Admission: I have personally reviewed following labs and imaging studies  CBC: Recent Labs  Lab 06/15/20 2312  WBC 5.1  HGB 6.2*  HCT 20.5*  MCV 77.1*  PLT 300   Basic Metabolic Panel: Recent Labs  Lab 06/15/20 2312  NA 141  K 2.5*  CL 94*  CO2 33*  GLUCOSE 101*  BUN 14  CREATININE 0.98  CALCIUM 11.2*  MG 1.2*   GFR: CrCl cannot be calculated (Unknown ideal weight.).  Liver Function Tests: Recent Labs  Lab 06/15/20 2312  AST 18  ALT 12  ALKPHOS 70  BILITOT 0.3  PROT 6.6  ALBUMIN 3.5   Radiological Exams on Admission: No results found.  EKG: Independently reviewed.   Assessment/Plan Principal Problem:   Hypokalemia Observation/telemetry. Received oral and IV  supplementation. Follow-up potassium level. Magnesium has been replenished as well. Advised his daughter to resume potassium supplement.  Active Problems:   Hypomagnesemia Replaced. Advised daughter to supplement with mag oxide.    Hypercalcemia History of primary hyperparathyroidism. Recheck calcium in a.m. after electrolyte replacement. Check phosphorus level.     Symptomatic anemia Received 2 PRBC units. Follow-up posttransfusion H&H. Keep n.p.o. Hold Xarelto for now. Follow posttransfusion H&H. Pantoprazole 40 mg IVP every 24 hours. In or outpatient endoscopic studies pending GI input.    Hyperlipemia Continue on atorvastatin 10 mg p.o. daily.    Essential hypertension On furosemide 40 mg daily. Will need potassium and magnesium supplementation. Follow-up BP, renal function electrolytes.    GERD On pantoprazole.    Memory loss Continue Aricept 10 mg p.o. daily    History of DVT (deep vein thrombosis) Off Xarelto. SCDs while off anticoagulation.     DVT prophylaxis: SCDs.  Off Xarelto due to symptomatic anemia. Code Status:   Full code.   Family Communication: His daughter was present in the ED room. Disposition Plan:   Patient is from:  Home.  Anticipated DC to:  Home.  Anticipated DC date:  06/17/2020.  Anticipated DC barriers: Clinical improvement.  Consults called: Admission status:  Observation/telemetry.  Severity of Illness:  High severity.  Reubin Milan MD [facility]  How to contact the [facility] Attending or Consulting provider [place] or covering provider during after hours [place], for this patient?   1. Check the care team in [facility] and look for a) attending/consulting [facility] provider listed and b) the [facility] team listed 2. Log into [email or web address] and use [place]'s universal password to access. If you do not have the password, please contact the hospital operator. 3. Locate the [facility] provider you are looking for under [facility]  and page to a number that you can be directly reached. 4. If you still have difficulty reaching the provider, please page the [place] (Director on Call) for the Hospitalists listed on amion for assistance.  06/16/2020, 1:01 AM   This document was prepared using Dragon voice recognition software and may contain some unintended transcription errors.

## 2020-06-16 NOTE — ED Notes (Signed)
He is awake, alert and comfortably visiting with his daughter. He has just voided and had a small, soft brown stool. Monitor shows nsr-sinus brady with average heart rate of 56-62. He is breathing normally.

## 2020-06-16 NOTE — Consult Note (Addendum)
Consultation  Referring Provider: [facility] Sarajane Jews Primary Care Physician:  Loraine Leriche., MD Primary Gastroenterologist:  Dr.Kaplan 2014  Reason for Consultation:  Microcytic anemia  HPI: [name] is a 86 y.o. male ,, who was admitted through the emergency room last evening after he had been found to have abnormal labs as an outpatient.  He had gone to his PCP for routine follow-up and labs revealed a hemoglobin of 6.  He was advised to come to the emergency room for transfusions and further work-up. Patient has history of dementia, prior history of DVT/PE and has been maintained on Xarelto, BPH, hyperparathyroidism, hypertension and undefined neuromuscular disorder. Labs in the ER showed hemoglobin 6.2 hematocrit of 20.5 MCV of 77, potassium 2.5/creatinine 0.98, LFTs within normal limits. He has received 2 units of packed RBCs and hemoglobin up to 8.3 today. Stool has not been hemocculted.  He is known to Dr. Deatra Ina from prior colonoscopy done in 2014 at which time he had a 1 cm polyp removed from the ascending colon was also noted to have a colonic lipoma and severe diverticulosis.  Path on the polyp consistent with a tubular adenoma.  He had had EGD in 2010 which was unremarkable.  He does have history of chronic GERD.  Patient is a poor historian due to his dementia.  According to his daughter he has been feeling fine and has had absolutely no complaints.  He says he feels fine today.  There has not been any known melena at home.  Apparently a caregiver at home had noticed on occasion a small amount of bright red on the edge of the commode.  Bowel movements have been normal.  He has been eating well without complaints.  No complaints of dysphagia, denies any abdominal pain or discomfort.   Past Medical History:  Diagnosis Date  . Allergy   . BPH (benign prostatic hypertrophy)   . Diverticulosis   . DVT of lower extremity (deep venous thrombosis) (HCC)    right leg,  01-10-11. sees Dr. Linus Mako   . ED (erectile dysfunction)   . GERD (gastroesophageal reflux disease)   . Headache(784.0)   . Hypercalcemia    sees Dr Loanne Drilling  . Hyperparathyroidism ([place])   . Hypertension   . Neuromuscular disorder (HCC)    sciatica  . Normal cardiac stress test    10-29-08  . Osteoarthritis   . Other and unspecified hyperlipidemia   . Personal history of colonic polyps   . Pulmonary embolism ([place])    01-10-11    Past Surgical History:  Procedure Laterality Date  . BASAL CELL CARCINOMA EXCISION     removed face back Dr Sherrye Payor, had MOHS Dr Sarajane Jews  . COLONOSCOPY  09-16-13   per Dr. Deatra Ina, benign polyps and severe diverticulosis, no repeats   . ENDOVENOUS ABLATION SAPHENOUS VEIN W/ LASER Bilateral 05-28-13   per Dr. Renaldo Reel   . HERNIA REPAIR     Inguinal right Dr Rise Patience  3-05  . JOINT REPLACEMENT     rt knee replacement 2003 Dr Wynelle Link  . KNEE ARTHROSCOPY     rt knee  . KNEE ARTHROSCOPY     left knee 2011 Dr Wynelle Link   . TONSILLECTOMY    . TOTAL KNEE ARTHROPLASTY  12/08/2012   Procedure: TOTAL KNEE ARTHROPLASTY;  Surgeon: [name], MD;  Location: [facility];  Service: Orthopedics;  Laterality: Left;  . vasectomy      Prior to Admission medications   Medication  Sig Start Date End Date Taking? Authorizing Provider  atorvastatin (LIPITOR) 10 MG tablet TAKE 1 TABLET(10 MG) BY MOUTH DAILY Patient taking differently: Take 10 mg by mouth daily.  03/07/20  Yes Laurey Morale, MD  b complex vitamins capsule Take 1 capsule by mouth daily.   Yes [provider]  cholecalciferol (VITAMIN D3) 25 MCG (1000 UNIT) tablet Take 1,000 Units by mouth daily.   Yes [provider]  diclofenac sodium (VOLTAREN) 1 % GEL Apply topically daily.   Yes [provider]  donepezil (ARICEPT) 10 MG tablet TAKE 1 TABLET(10 MG) BY MOUTH AT BEDTIME Patient taking differently: Take 10 mg by mouth at bedtime.  12/07/19  Yes Laurey Morale, MD    finasteride (PROSCAR) 5 MG tablet TAKE 1 TABLET(5 MG) BY MOUTH DAILY Patient taking differently: Take 5 mg by mouth daily.  01/08/19  Yes Laurey Morale, MD  furosemide (LASIX) 40 MG tablet TAKE 1 TABLET(40 MG) BY MOUTH DAILY BEFORE BREAKFAST Patient taking differently: Take 40 mg by mouth daily.  12/07/19  Yes Laurey Morale, MD  memantine (NAMENDA) 10 MG tablet TAKE 1 TABLET(10 MG) BY MOUTH TWICE DAILY Patient taking differently: Take 10 mg by mouth 2 (two) times daily.  05/31/20  Yes Laurey Morale, MD  Multiple Vitamin (MULTIVITAMIN WITH MINERALS) TABS tablet Take 1 tablet by mouth daily.   Yes [provider]  pantoprazole (PROTONIX) 40 MG tablet TAKE 1 TABLET(40 MG) BY MOUTH TWICE DAILY Patient taking differently: Take 40 mg by mouth 2 (two) times daily.  01/08/19  Yes Laurey Morale, MD  silodosin (RAPAFLO) 8 MG CAPS capsule Take 1 capsule (8 mg total) by mouth daily with breakfast. 01/08/19  Yes Laurey Morale, MD  XARELTO 20 MG TABS tablet TAKE 1 TABLET(20 MG) BY MOUTH DAILY WITH SUPPER Patient taking differently: Take 20 mg by mouth daily with supper.  12/07/19  Yes Laurey Morale, MD  fluticasone (FLONASE) 50 MCG/ACT nasal spray SHAKE LIQUID AND USE 2 SPRAYS IN [facility] NOSTRIL EVERY DAY Patient not taking: Reported on 06/16/2020 06/24/17   Laurey Morale, MD  fluticasone [facility]) 50 MCG/ACT nasal spray SHAKE LIQUID AND USE 2 SPRAYS IN [facility] NOSTRIL EVERY DAY Patient not taking: Reported on 06/16/2020 12/20/17   Laurey Morale, MD  potassium chloride (KLOR-CON 10) 10 MEQ tablet Take 1 tablet (10 mEq total) by mouth daily. Patient needs an appointment for further refills. Patient not taking: Reported on 06/16/2020 01/04/20   Laurey Morale, MD    Current Facility-Administered Medications  Medication Dose Route Frequency Provider Last Rate Last Admin  . acetaminophen (TYLENOL) tablet 650 mg  650 mg Oral Q6H PRN Reubin Milan, MD       Or  . acetaminophen (TYLENOL) suppository 650 mg   650 mg Rectal Q6H PRN Reubin Milan, MD      . ondansetron [facility]) tablet 4 mg  4 mg Oral Q6H PRN Reubin Milan, MD       Or  . ondansetron [facility]) injection 4 mg  4 mg Intravenous Q6H PRN Reubin Milan, MD      . pantoprazole (PROTONIX) injection 40 mg  40 mg Intravenous Q24H Reubin Milan, MD      . potassium chloride SA (KLOR-CON) CR tablet 40 mEq  40 mEq Oral Q4H Samuella Cota, MD      . potassium PHOSPHATE 20 mmol in dextrose 5 % 500 mL infusion  20 mmol  Intravenous Once Samuella Cota, MD       Current Outpatient Medications  Medication Sig Dispense Refill  . atorvastatin (LIPITOR) 10 MG tablet TAKE 1 TABLET(10 MG) BY MOUTH DAILY (Patient taking differently: Take 10 mg by mouth daily. ) 90 tablet 3  . b complex vitamins capsule Take 1 capsule by mouth daily.    . cholecalciferol (VITAMIN D3) 25 MCG (1000 UNIT) tablet Take 1,000 Units by mouth daily.    . diclofenac sodium (VOLTAREN) 1 % GEL Apply topically daily.    Marland Kitchen donepezil (ARICEPT) 10 MG tablet TAKE 1 TABLET(10 MG) BY MOUTH AT BEDTIME (Patient taking differently: Take 10 mg by mouth at bedtime. ) 90 tablet 3  . finasteride (PROSCAR) 5 MG tablet TAKE 1 TABLET(5 MG) BY MOUTH DAILY (Patient taking differently: Take 5 mg by mouth daily. ) 90 tablet 3  . furosemide (LASIX) 40 MG tablet TAKE 1 TABLET(40 MG) BY MOUTH DAILY BEFORE BREAKFAST (Patient taking differently: Take 40 mg by mouth daily. ) 90 tablet 3  . memantine (NAMENDA) 10 MG tablet TAKE 1 TABLET(10 MG) BY MOUTH TWICE DAILY (Patient taking differently: Take 10 mg by mouth 2 (two) times daily. ) 60 tablet 0  . Multiple Vitamin (MULTIVITAMIN WITH MINERALS) TABS tablet Take 1 tablet by mouth daily.    . pantoprazole (PROTONIX) 40 MG tablet TAKE 1 TABLET(40 MG) BY MOUTH TWICE DAILY (Patient taking differently: Take 40 mg by mouth 2 (two) times daily. ) 180 tablet 3  . silodosin (RAPAFLO) 8 MG CAPS capsule Take 1 capsule (8 mg total) by mouth daily  with breakfast. 90 capsule 3  . XARELTO 20 MG TABS tablet TAKE 1 TABLET(20 MG) BY MOUTH DAILY WITH SUPPER (Patient taking differently: Take 20 mg by mouth daily with supper. ) 90 tablet 3  . fluticasone (FLONASE) 50 MCG/ACT nasal spray SHAKE LIQUID AND USE 2 SPRAYS IN EACH NOSTRIL EVERY DAY (Patient not taking: Reported on 06/16/2020) 48 g 1  . fluticasone (FLONASE) 50 MCG/ACT nasal spray SHAKE LIQUID AND USE 2 SPRAYS IN EACH NOSTRIL EVERY DAY (Patient not taking: Reported on 06/16/2020) 16 g 0  . potassium chloride (KLOR-CON 10) 10 MEQ tablet Take 1 tablet (10 mEq total) by mouth daily. Patient needs an appointment for further refills. (Patient not taking: Reported on 06/16/2020) 90 tablet 0   Facility-Administered Medications Ordered in Other Encounters  Medication Dose Route Frequency Provider Last Rate Last Admin  . bupivacaine liposome (EXPAREL) 1.3 % injection 266 mg  20 mL Infiltration Once Gaynelle Arabian, MD        Allergies as of 06/15/2020  . (No Known Allergies)    Family History  Problem Relation Age of Onset  . Heart disease Mother   . Colon cancer Father   . Arthritis Other   . Cancer Other        Colon 1st degree relative <60  . Pancreatic cancer Neg Hx   . Stomach cancer Neg Hx     Social History   Socioeconomic History  . Marital status: Married    Spouse name: Not on file  . Number of children: Not on file  . Years of education: Not on file  . Highest education level: Not on file  Occupational History  . Occupation: retired    Fish farm manager: RETIRED  Tobacco Use  . Smoking status: Former Smoker    Packs/day: 0.50    Years: 7.00    Pack years: 3.50    Types: Cigarettes  Start date: 01/18/1955    Quit date: 11/26/1961    Years since quitting: 58.5  . Smokeless tobacco: Never Used  . Tobacco comment: quit > 50 years ado  Substance and Sexual Activity  . Alcohol use: Yes    Alcohol/week: 14.0 standard drinks    Types: 14 Standard drinks or equivalent per week      Comment: 2 scotch drinks a day  . Drug use: No  . Sexual activity: Not on file  Other Topics Concern  . Not on file  Social History Narrative   Daily caffeine use   Social Determinants of Health   Financial Resource Strain:   . Difficulty of Paying Living Expenses:   Food Insecurity:   . Worried About Charity fundraiser in the Last Year:   . Arboriculturist in the Last Year:   Transportation Needs:   . Film/video editor (Medical):   Marland Kitchen Lack of Transportation (Non-Medical):   Physical Activity:   . Days of Exercise per Week:   . Minutes of Exercise per Session:   Stress:   . Feeling of Stress :   Social Connections:   . Frequency of Communication with Friends and Family:   . Frequency of Social Gatherings with Friends and Family:   . Attends Religious Services:   . Active Member of Clubs or Organizations:   . Attends Archivist Meetings:   Marland Kitchen Marital Status:   Intimate Partner Violence:   . Fear of Current or Ex-Partner:   . Emotionally Abused:   Marland Kitchen Physically Abused:   . Sexually Abused:     Review of Systems: Pertinent positive and negative review of systems were noted in the above HPI section.  All other review of systems was otherwise negative.  Physical Exam: Vital signs in last 24 hours: Temp:  [97.9 F (36.6 C)-98.3 F (36.8 C)] 98.3 F (36.8 C) (07/22 0808) Pulse Rate:  [49-131] 49 (07/22 1158) Resp:  [9-25] 15 (07/22 1138) BP: (110-157)/(50-109) 126/64 (07/22 1138) SpO2:  [88 %-100 %] 94 % (07/22 1158)   General:   Alert,  Well-developed, well-nourished, thin elderly white male pleasant and cooperative in NAD daughter at bedside Head:  Normocephalic and atraumatic. Eyes:  Sclera clear, no icterus.   Conjunctiva pale. Ears:  Normal auditory acuity. Nose:  No deformity, discharge,  or lesions. Mouth:  No deformity or lesions.   Neck:  Supple; no masses or thyromegaly. Lungs:  Clear throughout to auscultation.   No wheezes, crackles, or  rhonchi. Heart:  Regular rate and rhythm; no murmurs, clicks, rubs,  or gallops. Abdomen:  Soft,nontender, BS active,nonpalp mass or hsm.   Rectal:  Deferred -stool in bedside commode soft and brown Msk:  Symmetrical without gross deformities. . Pulses:  Normal pulses noted. Extremities:  Without clubbing or edema. Neurologic:  Alert and  oriented x2;  grossly normal neurologically. Skin:  Intact without significant lesions or rashes.. Psych:  Alert and cooperative. Normal mood and affect.  Intake/Output from previous day: 07/21 0701 - 07/22 0700 In: 325 [Blood:325] Out: -  Intake/Output this shift: Total I/O In: 330 [Blood:330] Out: 240 [Urine:240]  Lab Results: Recent Labs    06/15/20 2312 06/16/20 0937  WBC 5.1 6.2  HGB 6.2* 8.3*  HCT 20.5* 26.7*  PLT 315 276   BMET Recent Labs    06/15/20 2312 06/16/20 0937  NA 141 138  K 2.5* 3.3*  CL 94* 101  CO2 33* 29  GLUCOSE 101*  95  BUN 14 13  CREATININE 0.98 0.81  CALCIUM 11.2* 10.3   LFT Recent Labs    06/15/20 2312  PROT 6.6  ALBUMIN 3.5  AST 18  ALT 12  ALKPHOS 70  BILITOT 0.3   PT/INR Recent Labs    06/16/20 0937  LABPROT 13.9  INR 1.1   Hepatitis Panel No results for input(s): HEPBSAG, HCVAB, HEPAIGM, HEPBIGM in the last 72 hours.      IMPRESSION:  #31 86 year old white male, admitted with microcytic anemia, relatively asymptomatic and without any known recent melena or significant hematochezia. Etiology of anemia is not clear, in setting of chronic Xarelto, suspect secondary to slow GI blood loss, but cannot rule out other etiologies.  Consider chronic gastropathy, AVMs, occult neoplasm  #2 chronic anticoagulation-Xarelto #3 prior history of DVT/PE #4 dementia #5 chronic GERD 6.  Hyperparathyroidism 7.  Prior history of adenomatous colon polyps-last colonoscopy 2014  PLAN:  Clear liquid diet this afternoon Serial hemoglobins and will check iron studies Hemoccult stool Long  discussion with the patient's daughter today in the patient's room regarding options for further evaluation.  We discussed colonoscopy and EGD, realizing that bowel prep may be difficult for him, and at increased risk for complications with sedation etc. given advanced age. Patient's daughter would like to have a conversation with her mom and brother, and they will make a decision regarding pursuing endoscopic evaluation during this hospitalization versus conservative management with potential iron replacement, serial hemoglobins considering possibility of discontinuing Xarelto,. I will check with patient's family later today regarding decision and then possibility of scheduling procedures for tomorrow. Last dose of Xarelto was Tuesday, 06/14/2020   Eran Windish EsterwoodPA-C  06/16/2020, 12:07 PM    Attending physician's note   I have taken a history, examined the patient and reviewed the chart. I agree with the Advanced Practitioner's note, impression and recommendations.  86 year old very pleasant gentleman, with dementia admitted with anemia History provided by his daughter  History of DVT/PE on Xarelto  Anemia noted on routine labs by PMD, he is relatively asymptomatic Hemoglobin 6.2, MCV 77  No history of overt GI bleeding.  Fecal Hemoccult negative  Check iron panel, ferritin, B12 and folate  Discussed at length with patient's daughter options for further work-up of anemia.   Possible intermittent subacute/chronic GI blood loss in the differential of microcytic anemia, cannot rule out without endoscopic (EGD/Colonoscopy) evaluation but given his clinical condition with dementia, patient may have difficulty completing the bowel prep.  She would like to discuss with rest of her family and decide how to proceed  She was also given the option of managing the patient more conservatively with routine monitoring of CBC as outpatient, IV iron therapy if needed  Continue to hold Xarelto Clear  liquids  GI will continue to follow  K. Denzil Magnuson , MD [phone]    Addendum - 5 pm - family has decided to proceed with Colonoscopy and EGD - will prep tonight , and have scheduled procedures with Dr Henrene Pastor for tomorrow afternoon.

## 2020-06-16 NOTE — ED Provider Notes (Signed)
[facility] DEPT Provider Note   CSN: [id number] Arrival date & time: 06/15/20  2222     History No chief complaint on file.   TUNIS GENTLE is a 86 y.o. male.  The history is provided by the patient and a relative. The history is limited by the condition of the patient.  Illness Location:  At doctor's office  Quality:  Abnormal labs, family could not say which labs were abnormal  Severity:  Severe Onset quality:  Gradual Timing:  Constant Progression:  Worsening Chronicity: unknown. Context:  Has not had labs since 2/20 Relieved by:  Nothing Worsened by:  Nothing Ineffective treatments:  None tried  Associated symptoms: no abdominal pain, no chest pain, no congestion, no cough, no diarrhea, no ear pain, no fatigue, no fever, no headaches, no loss of consciousness, no myalgias, no nausea, no rash, no rhinorrhea, no shortness of breath, no sore throat, no vomiting and no wheezing   Risk factors:  Elderly       Past Medical History:  Diagnosis Date  . Allergy   . BPH (benign prostatic hypertrophy)   . Diverticulosis   . DVT of lower extremity (deep venous thrombosis) (HCC)    right leg, 01-10-11. sees Dr. Linus Mako   . ED (erectile dysfunction)   . GERD (gastroesophageal reflux disease)   . Headache(784.0)   . Hypercalcemia    sees Dr Loanne Drilling  . Hyperparathyroidism ([place])   . Hypertension   . Neuromuscular disorder (HCC)    sciatica  . Normal cardiac stress test    10-29-08  . Osteoarthritis   . Other and unspecified hyperlipidemia   . Personal history of colonic polyps   . Pulmonary embolism ([place])    01-10-11    Patient Active Problem List   Diagnosis Date Noted  . History of DVT (deep vein thrombosis) 01/08/2019  . Tinnitus 04/12/2016  . Memory loss 12/02/2015  . Bilateral tinnitus 06/29/2014  . OA (osteoarthritis) of knee 12/08/2012  . EDEMA LEG 01/10/2011  . EDEMA 05/17/2010  . PRIMARY HYPERPARATHYROIDISM  03/22/2010  . HYPERCALCEMIA 12/27/2009  . DIVERTICULITIS, COLON 07/07/2008  . ERECTILE DYSFUNCTION 12/24/2007  . BPH with urinary obstruction 12/24/2007  . Osteoarthritis 12/24/2007  . Hyperlipemia 10/01/2007  . Essential hypertension 10/01/2007  . ALLERGIC RHINITIS 10/01/2007  . GERD 10/01/2007  . COLONIC POLYPS, HX OF 10/01/2007    Past Surgical History:  Procedure Laterality Date  . BASAL CELL CARCINOMA EXCISION     removed face back Dr Sherrye Payor, had MOHS Dr Sarajane Jews  . COLONOSCOPY  09-16-13   per Dr. Deatra Ina, benign polyps and severe diverticulosis, no repeats   . ENDOVENOUS ABLATION SAPHENOUS VEIN W/ LASER Bilateral 05-28-13   per Dr. Renaldo Reel   . HERNIA REPAIR     Inguinal right Dr Rise Patience  3-05  . JOINT REPLACEMENT     rt knee replacement 2003 Dr Wynelle Link  . KNEE ARTHROSCOPY     rt knee  . KNEE ARTHROSCOPY     left knee 2011 Dr Wynelle Link   . TONSILLECTOMY    . TOTAL KNEE ARTHROPLASTY  12/08/2012   Procedure: TOTAL KNEE ARTHROPLASTY;  Surgeon: [name], MD;  Location: [facility];  Service: Orthopedics;  Laterality: Left;  . vasectomy         Family History  Problem Relation Age of Onset  . Heart disease Mother   . Colon cancer Father   . Arthritis Other   . Cancer Other  Colon 1st degree relative <60  . Pancreatic cancer Neg Hx   . Stomach cancer Neg Hx     Social History   Tobacco Use  . Smoking status: Former Smoker    Packs/day: 0.50    Years: 7.00    Pack years: 3.50    Types: Cigarettes    Start date: 01/18/1955    Quit date: 11/26/1961    Years since quitting: 58.5  . Smokeless tobacco: Never Used  . Tobacco comment: quit > 50 years ado  Substance Use Topics  . Alcohol use: Yes    Alcohol/week: 14.0 standard drinks    Types: 14 Standard drinks or equivalent per week    Comment: 2 scotch drinks a day  . Drug use: No    Home Medications Prior to Admission medications   Medication Sig Start Date End Date Taking? Authorizing Provider    atorvastatin (LIPITOR) 10 MG tablet TAKE 1 TABLET(10 MG) BY MOUTH DAILY 03/07/20   Laurey Morale, MD  diclofenac sodium (VOLTAREN) 1 % GEL Apply topically daily.    [provider]  donepezil (ARICEPT) 10 MG tablet TAKE 1 TABLET(10 MG) BY MOUTH AT BEDTIME 12/07/19   Laurey Morale, MD  finasteride (PROSCAR) 5 MG tablet TAKE 1 TABLET(5 MG) BY MOUTH DAILY 01/08/19   Laurey Morale, MD  fluticasone (FLONASE) 50 MCG/ACT nasal spray SHAKE LIQUID AND USE 2 SPRAYS IN [facility] NOSTRIL EVERY DAY 06/24/17   Laurey Morale, MD  fluticasone (FLONASE) 50 MCG/ACT nasal spray SHAKE LIQUID AND USE 2 SPRAYS IN [facility] NOSTRIL EVERY DAY 12/20/17   Laurey Morale, MD  furosemide (LASIX) 40 MG tablet TAKE 1 TABLET(40 MG) BY MOUTH DAILY BEFORE BREAKFAST 12/07/19   Laurey Morale, MD  memantine (NAMENDA) 10 MG tablet TAKE 1 TABLET(10 MG) BY MOUTH TWICE DAILY 05/31/20   Laurey Morale, MD  pantoprazole (PROTONIX) 40 MG tablet TAKE 1 TABLET(40 MG) BY MOUTH TWICE DAILY 01/08/19   Laurey Morale, MD  polyethylene glycol powder (GLYCOLAX/MIRALAX) powder Take 17 g by mouth daily.     [provider]  potassium chloride (KLOR-CON 10) 10 MEQ tablet Take 1 tablet (10 mEq total) by mouth daily. Patient needs an appointment for further refills. 01/04/20   Laurey Morale, MD  Psyllium (METAMUCIL) 30.9 % POWD Take 15 mLs by mouth daily.     [provider]  silodosin (RAPAFLO) 8 MG CAPS capsule Take 1 capsule (8 mg total) by mouth daily with breakfast. 01/08/19   Laurey Morale, MD  XARELTO 20 MG TABS tablet TAKE 1 TABLET(20 MG) BY MOUTH DAILY WITH SUPPER 12/07/19   Laurey Morale, MD    Allergies    Patient has no known allergies.  Review of Systems   Review of Systems  Constitutional: Negative for fatigue and fever.  HENT: Negative for congestion, ear pain, rhinorrhea and sore throat.   Respiratory: Negative for cough, shortness of breath and wheezing.   Cardiovascular: Negative for chest pain.  Gastrointestinal:  Negative for abdominal pain, blood in stool, diarrhea, nausea and vomiting.  Genitourinary: Negative for difficulty urinating.  Musculoskeletal: Negative for myalgias.  Skin: Negative for rash.  Neurological: Negative for loss of consciousness and headaches.  Psychiatric/Behavioral: Negative for agitation.  All other systems reviewed and are negative.   Physical Exam Updated Vital Signs BP 120/64 (BP Location: Right Arm)   Pulse 64   Temp 98 F (36.7 C) (Oral)   Resp 18   SpO2  98%   Physical Exam Vitals and nursing note reviewed. Exam conducted with a chaperone present.  Constitutional:      General: He is not in acute distress.    Appearance: Normal appearance.  HENT:     Head: Normocephalic and atraumatic.     Nose: Nose normal.  Eyes:     Conjunctiva/sclera: Conjunctivae normal.     Pupils: Pupils are equal, round, and reactive to light.  Cardiovascular:     Rate and Rhythm: Normal rate and regular rhythm.     Pulses: Normal pulses.     Heart sounds: Normal heart sounds.  Pulmonary:     Effort: Pulmonary effort is normal.     Breath sounds: Normal breath sounds.  Abdominal:     General: Abdomen is flat. Bowel sounds are normal.     Tenderness: There is no abdominal tenderness. There is no guarding or rebound.  Genitourinary:    Rectum: Guaiac result negative.  Musculoskeletal:        General: Normal range of motion.     Cervical back: Normal range of motion and neck supple.  Skin:    General: Skin is warm and dry.     Capillary Refill: Capillary refill takes less than 2 seconds.  Neurological:     General: No focal deficit present.     Mental Status: He is alert.     Deep Tendon Reflexes: Reflexes normal.  Psychiatric:        Mood and Affect: Mood normal.        Behavior: Behavior normal.     ED Results / Procedures / Treatments   Labs (all labs ordered are listed, but only abnormal results are displayed) Results for orders placed or performed during the  hospital encounter of 06/15/20  Comprehensive metabolic panel  Result Value Ref Range   Sodium 141 135 - 145 mmol/L   Potassium 2.5 (LL) 3.5 - 5.1 mmol/L   Chloride 94 (L) 98 - 111 mmol/L   CO2 33 (H) 22 - 32 mmol/L   Glucose, Bld 101 (H) 70 - 99 mg/dL   BUN 14 8 - 23 mg/dL   Creatinine, Ser 0.98 0.61 - 1.24 mg/dL   Calcium 11.2 (H) 8.9 - 10.3 mg/dL   Total Protein 6.6 6.5 - 8.1 g/dL   Albumin 3.5 3.5 - 5.0 g/dL   AST 18 15 - 41 U/L   ALT 12 0 - 44 U/L   Alkaline Phosphatase 70 38 - 126 U/L   Total Bilirubin 0.3 0.3 - 1.2 mg/dL   GFR calc non Af Amer >60 >60 mL/min   GFR calc Af Amer >60 >60 mL/min   Anion gap 14 5 - 15  CBC  Result Value Ref Range   WBC 5.1 4.0 - 10.5 K/uL   RBC 2.66 (L) 4.22 - 5.81 MIL/uL   Hemoglobin 6.2 (LL) 13.0 - 17.0 g/dL   HCT 20.5 (L) 39 - 52 %   MCV 77.1 (L) 80.0 - 100.0 fL   MCH 23.3 (L) 26.0 - 34.0 pg   MCHC 30.2 30.0 - 36.0 g/dL   RDW 17.2 (H) 11.5 - 15.5 %   Platelets 315 150 - 400 K/uL   nRBC 0.0 0.0 - 0.2 %  Magnesium  Result Value Ref Range   Magnesium 1.2 (L) 1.7 - 2.4 mg/dL  POC occult blood, ED  Result Value Ref Range   Fecal Occult Bld NEGATIVE NEGATIVE  Type and screen [facility] [facility]  Result Value  Ref Range   ABO/RH(D) O POS    Antibody Screen NEG    Sample Expiration      06/18/2020,2359 Performed at [facility] [address], [place], [place] [address]   Prepare RBC (crossmatch)  Result Value Ref Range   Order Confirmation      ORDER PROCESSED BY BLOOD BANK Performed at [facility] [address] [place], [place] [address]    No results found.  EKG None  Radiology No results found.  Procedures Procedures (including critical care time)  Medications Ordered in ED Medications  magnesium sulfate IVPB 2 g 50 mL (2 g Intravenous New Bag/Given 06/16/20 0022)  potassium chloride 10 mEq in 100 mL IVPB (10 mEq Intravenous New Bag/Given 06/16/20 0019)  0.9 %   sodium chloride infusion (Manually program via Guardrails IV Fluids) (has no administration in time range)  potassium chloride SA (KLOR-CON) CR tablet 40 mEq (40 mEq Oral Given 06/16/20 0022)    ED Course  I have reviewed the triage vital signs and the nursing notes.  Pertinent labs & imaging results that were available during my care of the patient were reviewed by me and considered in my medical decision making (see chart for details).    MDM Reviewed: previous chart, nursing note and vitals Interpretation: labs (hypokalemia, hypomagnesemia low Hgb ) Total time providing critical care: 30-74 minutes (transfusion ). This excludes time spent performing separately reportable procedures and services. Consults: admitting MD  CRITICAL CARE Performed by: Makennah Omura K Charizma Gardiner-Rasch Total critical care time: 60 minutes Critical care time was exclusive of separately billable procedures and treating other patients. Critical care was necessary to treat or prevent imminent or life-threatening deterioration. Critical care was time spent personally by me on the following activities: development of treatment plan with patient and/or surrogate as well as nursing, discussions with consultants, evaluation of patient's response to treatment, examination of patient, obtaining history from patient or surrogate, ordering and performing treatments and interventions, ordering and review of laboratory studies, ordering and review of radiographic studies, pulse oximetry and re-evaluation of patient's condition.  Final Clinical Impression(s) / ED Diagnoses Final diagnoses:  Hypokalemia  Anemia, unspecified type    Admit to medicine    Geovanny Sartin, MD 06/16/20 [id number]

## 2020-06-16 NOTE — Progress Notes (Signed)
PROGRESS NOTE  [name] MCN:[id number] DOB: 09/03/1933 DOA: 06/15/2020 PCP: [name]., MD  Brief History   86yom sent to ED by PCP for abnl labs. Found to have anemia, hypokalemia, hypercalcemia, hypomagnesemia.   A & P  Symptomatic microcytic anemia --no anemia panel checked prior to PRBC. s/p 2 units PRBC w/ appropriate rise; no bleeding here. Suspect slow bleed diverticular vs internal hemorrhoids. Last colonoscopy Dr [name] 2014--severe diverticulosis and internal hemorrhoids; polpys; no repeat study rec'd at that time. --hold Xarelto, continue PPI --serial CBC --GI consult requested by daughter for recs,   Hypokalemia, hypomagnesemia, hypophosphatemia, hypercalcemia. PMH hyperparathyroidism and hypercalcemia. --complicated by outpt furosemide --K+ better, will replace further. Calcium WNL. --replace phos --labs in AM  PMH DVT, PE --hold Xarelto --SCDs  GERD --continue Protonix  BPH --continue finasteride, silodosin  Dementia --stable. Ccontinue memantine  Disposition Plan:  Discussion: appears stable, appropriate rise in Hgb. Trend CBC, replace lytes, f/u GI recs.   Status is: Observation  The patient will require care spanning > 2 midnights and should be moved to inpatient because: Persistent severe electrolyte disturbances  Dispo: The patient is from: Home              Anticipated d/c is to: Home              Anticipated d/c date is: 2 days              Patient currently is not medically stable to d/c.  DVT prophylaxis: SCDs Start: 06/16/20 0056 Code Status: Full Family Communication: duaghter at beside  David Hodgkins, MD  [facility] Direct contact: see [email or web address] (further directions at bottom of note if needed) 7PM-7AM contact night coverage as at bottom of note 06/16/2020, 9:26 AM  LOS: 0 days   Significant Hospital Events   .    Consults:  . GI   Procedures:  .   Significant Diagnostic Tests:  Marland Kitchen    Micro Data:   .    Antimicrobials:  .   Interval History/Subjective  Feels fine, no complaints, no pain. Diarrhea in ED, no blood. Per daughter home care staff have reported episodes of blood in bowl.  Objective   Vitals:  Vitals:   06/16/20 0803 06/16/20 0808  BP: 123/68 123/68  Pulse: (!) 59 (!) 59  Resp: 16 16  Temp: 98.3 F (36.8 C) 98.3 F (36.8 C)  SpO2:  98%    Exam:  Constitutional:   . Appears calm and comfortable on stretcher in ED Respiratory:  . CTA bilaterally, no w/r/r.  . Respiratory effort normal.  Cardiovascular:  . RRR, no m/r/g . No LE extremity edema   Abdomen:  . Soft, ndnt Psychiatric:  . Mental status o Mood, affect appropriate  I have personally reviewed the following:   Today's Data  . K+ up to 3.3 . Phos 1.9 . Hgb up to 8.3 s/p 2 units  Scheduled Meds: . pantoprazole (PROTONIX) IV  40 mg Intravenous Q24H   Continuous Infusions:  Principal Problem:   Hypokalemia Active Problems:   Hyperlipemia   Hypercalcemia   Essential hypertension   GERD   Memory loss   History of DVT (deep vein thrombosis)   Hypomagnesemia   Symptomatic anemia   LOS: 0 days   How to contact the [facility] Attending or Consulting provider [place] or covering provider during after hours [place], for this patient?  1. Check the care team in [facility] and look for a) attending/consulting  [place] provider listed and b) the [facility] team listed 2. Log into [email or web address] and use [place]'s universal password to access. If you do not have the password, please contact the hospital operator. 3. Locate the [facility] provider you are looking for under [facility] and page to a number that you can be directly reached. 4. If you still have difficulty reaching the provider, please page the [facility] (Director on Call) for the Hospitalists listed on amion for assistance.

## 2020-06-16 NOTE — ED Notes (Signed)
I have just called report to Jarrett Soho, RN on [address]. Will transport shortly.

## 2020-06-16 NOTE — ED Notes (Signed)
Note: I am unable to locate the green RBC unit paper. I complete all requisite transfusion documentation in flowsheets. He is still visiting with his daughter. He is in no distress. He asks repetitive questions, as one does with dementia.

## 2020-06-17 ENCOUNTER — Inpatient Hospital Stay (HOSPITAL_COMMUNITY): Payer: Medicare Other | Admitting: Anesthesiology

## 2020-06-17 ENCOUNTER — Encounter (HOSPITAL_COMMUNITY): Payer: Self-pay | Admitting: Family Medicine

## 2020-06-17 ENCOUNTER — Encounter (HOSPITAL_COMMUNITY)
Admission: EM | Disposition: A | Discharge status: Home / Self Care | Payer: Self-pay | Source: Home / Self Care | Attending: Family Medicine

## 2020-06-17 DIAGNOSIS — K222 Esophageal obstruction: Secondary | ICD-10-CM

## 2020-06-17 DIAGNOSIS — D500 Iron deficiency anemia secondary to blood loss (chronic): Secondary | ICD-10-CM

## 2020-06-17 DIAGNOSIS — K5730 Diverticulosis of large intestine without perforation or abscess without bleeding: Secondary | ICD-10-CM

## 2020-06-17 HISTORY — PX: ESOPHAGOGASTRODUODENOSCOPY (EGD) WITH PROPOFOL: SHX5813

## 2020-06-17 HISTORY — PX: COLONOSCOPY WITH PROPOFOL: SHX5780

## 2020-06-17 LAB — CBC
HCT: 24.7 % — ABNORMAL LOW (ref 39.0–52.0)
Hemoglobin: 7.5 g/dL — ABNORMAL LOW (ref 13.0–17.0)
MCH: 24.3 pg — ABNORMAL LOW (ref 26.0–34.0)
MCHC: 30.4 g/dL (ref 30.0–36.0)
MCV: 79.9 fL — ABNORMAL LOW (ref 80.0–100.0)
Platelets: 266 10*3/uL (ref 150–400)
RBC: 3.09 MIL/uL — ABNORMAL LOW (ref 4.22–5.81)
RDW: 17.1 % — ABNORMAL HIGH (ref 11.5–15.5)
WBC: 6.9 10*3/uL (ref 4.0–10.5)
nRBC: 0 % (ref 0.0–0.2)

## 2020-06-17 LAB — BASIC METABOLIC PANEL
Anion gap: 9 (ref 5–15)
BUN: 9 mg/dL (ref 8–23)
CO2: 25 mmol/L (ref 22–32)
Calcium: 10.2 mg/dL (ref 8.9–10.3)
Chloride: 104 mmol/L (ref 98–111)
Creatinine, Ser: 0.62 mg/dL (ref 0.61–1.24)
GFR calc Af Amer: >60 mL/min (ref >60)
GFR calc non Af Amer: >60 mL/min (ref >60)
Glucose, Bld: 89 mg/dL (ref 70–99)
Potassium: 3.6 mmol/L (ref 3.5–5.1)
Sodium: 138 mmol/L (ref 135–145)

## 2020-06-17 LAB — PHOSPHORUS: Phosphorus: 1.7 mg/dL — ABNORMAL LOW (ref 2.5–4.6)

## 2020-06-17 LAB — MAGNESIUM: Magnesium: 1.3 mg/dL — ABNORMAL LOW (ref 1.7–2.4)

## 2020-06-17 SURGERY — COLONOSCOPY WITH PROPOFOL
Anesthesia: Monitor Anesthesia Care

## 2020-06-17 MED ORDER — POTASSIUM PHOSPHATES 15 MMOLE/5ML IV SOLN
30.0000 mmol | Freq: Once | INTRAVENOUS | Status: AC
  Administered 2020-06-17: 30 mmol via INTRAVENOUS
  Filled 2020-06-17: qty 10

## 2020-06-17 MED ORDER — LACTATED RINGERS IV SOLN: INTRAVENOUS | Status: DC

## 2020-06-17 MED ORDER — PROPOFOL 500 MG/50ML IV EMUL
INTRAVENOUS | Status: DC | PRN
  Administered 2020-06-17: 85 ug/kg/min via INTRAVENOUS

## 2020-06-17 MED ORDER — LIDOCAINE HCL (CARDIAC) PF 100 MG/5ML IV SOSY
PREFILLED_SYRINGE | INTRAVENOUS | Status: DC | PRN
  Administered 2020-06-17: 100 mg via INTRAVENOUS

## 2020-06-17 MED ORDER — MAGNESIUM SULFATE 2 GM/50ML IV SOLN
2.0000 g | Freq: Once | INTRAVENOUS | Status: AC
  Administered 2020-06-17: 2 g via INTRAVENOUS
  Filled 2020-06-17: qty 50

## 2020-06-17 MED ORDER — FLEET ENEMA 7-19 GM/118ML RE ENEM
1.0000 | ENEMA | RECTAL | Status: AC
  Administered 2020-06-17 (×2): 1 via RECTAL
  Filled 2020-06-17 (×2): qty 1

## 2020-06-17 MED ORDER — PROPOFOL 10 MG/ML IV BOLUS
INTRAVENOUS | Status: DC | PRN
  Administered 2020-06-17: 20 mg via INTRAVENOUS

## 2020-06-17 SURGICAL SUPPLY — 25 items

## 2020-06-17 NOTE — Progress Notes (Signed)
Re-oriented frequently, questions answered, support given, safety maintained

## 2020-06-17 NOTE — Progress Notes (Signed)
Patient completed 1.25 of prep prior to NPO status at midnight. Stool liquid but not clear.

## 2020-06-17 NOTE — Transfer of Care (Signed)
Immediate Anesthesia Transfer of Care Note  Patient: [name]  Procedure(s) Performed: COLONOSCOPY WITH PROPOFOL (N/A ) ESOPHAGOGASTRODUODENOSCOPY (EGD) WITH PROPOFOL (N/A )  Patient Location: PACU and Endoscopy Unit  Anesthesia Type:MAC  Level of Consciousness: awake, alert  and oriented  Airway & Oxygen Therapy: Patient Spontanous Breathing and Patient connected to face mask oxygen  Post-op Assessment: Report given to RN and Post -op Vital signs reviewed and stable  Post vital signs: Reviewed and stable  Last Vitals:  Vitals Value Taken Time  BP    Temp    Pulse 48 06/17/20 1648  Resp 34 06/17/20 1648  SpO2 100 % 06/17/20 1648  Vitals shown include unvalidated device data.  Last Pain:  Vitals:   06/17/20 1424  TempSrc: Oral  PainSc: 0-No pain         Complications: No complications documented.

## 2020-06-17 NOTE — Op Note (Signed)
[facility] Patient Name: [name] Procedure Date: 06/17/2020 MRN: [id number] Attending MD: Docia Chuck. Henrene Pastor , MD Date of Birth: 08-27-1933 CSN: [id number] Age: 86 Admit Type: Inpatient Procedure:                Upper GI endoscopy Indications:              Iron deficiency anemia Providers:                Docia Chuck. Henrene Pastor, MD, Benetta Spar RN, RN, Elspeth Cho Tech., Technician, Stephanie [place] CRNA Referring MD:             [facility] Medicines:                Monitored Anesthesia Care Complications:            No immediate complications. Estimated Blood Loss:     Estimated blood loss: none. Procedure:                Pre-Anesthesia Assessment:                           - Prior to the procedure, a History and Physical                            was performed, and patient medications and                            allergies were reviewed. The patient's tolerance of                            previous anesthesia was also reviewed. The risks                            and benefits of the procedure and the sedation                            options and risks were discussed with the patient.                            All questions were answered, and informed consent                            was obtained. Prior Anticoagulants: The patient has                            taken Xarelto (rivaroxaban), last dose was 3 days                            prior to procedure. ASA Grade Assessment: III - A                            patient with severe systemic disease. After  reviewing the risks and benefits, the patient was                            deemed in satisfactory condition to undergo the                            procedure.                           After obtaining informed consent, the endoscope was                            passed under direct vision. Throughout the                             procedure, the patient's blood pressure, pulse, and                            oxygen saturations were monitored continuously. The                            GIF-H190 ([id number]) Olympus gastroscope was                            introduced through the mouth, and advanced to the                            second part of duodenum. The upper GI endoscopy was                            accomplished without difficulty. The patient                            tolerated the procedure well. Scope In: Scope Out: Findings:      The esophagus revealed a ringlike distal stricture but was otherwise       normal.      The stomach was normal.      The examined duodenum was normal.      The cardia and gastric fundus were normal on retroflexion. Impression:               - Normal esophagus.                           - Normal stomach.                           - Normal examined duodenum.                           - No specimens collected. Moderate Sedation:      none Recommendation:           - Patient has a contact number available for                            emergencies. The signs and symptoms of potential  delayed complications were discussed with the                            patient. Return to normal activities tomorrow.                            Written discharge instructions were provided to the                            patient.                           - Resume previous diet.                           - Continue present medications.                           - Daily had supplementation                           - See colonoscopy report Procedure Code(s):        --- Professional ---                           [id number], Esophagogastroduodenoscopy, flexible,                            transoral; diagnostic, including collection of                            specimen(s) by brushing or washing, when performed                            (separate procedure) Diagnosis  Code(s):        --- Professional ---                           D50.9, Iron deficiency anemia, unspecified CPT copyright 2019 [employer] Medical Association. All rights reserved. The codes documented in this report are preliminary and upon coder review may  be revised to meet current compliance requirements. Docia Chuck. Henrene Pastor, MD 06/17/2020 4:50:12 PM This report has been signed electronically. Number of Addenda: 0

## 2020-06-17 NOTE — Op Note (Signed)
[facility] Patient Name: [name] Procedure Date: 06/17/2020 MRN: [id number] Attending MD: Docia Chuck. Henrene Pastor , MD Date of Birth: 03-Apr-1933 CSN: [id number] Age: 86 Admit Type: Inpatient Procedure:                Colonoscopy Indications:              Iron deficiency anemia Providers:                Docia Chuck. Henrene Pastor, MD, Benetta Spar RN, RN, Elspeth Cho Tech., Technician, Stephanie [place] CRNA Referring MD:             [facility] Medicines:                Monitored Anesthesia Care Complications:            No immediate complications. Estimated blood loss:                            None. Estimated Blood Loss:     Estimated blood loss: none. Procedure:                Pre-Anesthesia Assessment:                           - Prior to the procedure, a History and Physical                            was performed, and patient medications and                            allergies were reviewed. The patient's tolerance of                            previous anesthesia was also reviewed. The risks                            and benefits of the procedure and the sedation                            options and risks were discussed with the patient.                            All questions were answered, and informed consent                            was obtained. Prior Anticoagulants: The patient has                            taken Xarelto (rivaroxaban), last dose was 3 days                            prior to procedure. ASA Grade Assessment: III - A  patient with severe systemic disease. After                            reviewing the risks and benefits, the patient was                            deemed in satisfactory condition to undergo the                            procedure.                           After obtaining informed consent, the colonoscope                            was passed under direct vision.  Throughout the                            procedure, the patient's blood pressure, pulse, and                            oxygen saturations were monitored continuously. The                            CF-HQ190L ([id number]) Olympus colonoscope was                            introduced through the anus and advanced to the the                            transverse colon. The examination could not be                            carried beyond this point secondary to large                            inguinal hernia. See below.. The rectum was                            photographed. The quality of the bowel preparation                            was poor. The colonoscopy was performed WITH                            difficulty. The patient tolerated the procedure                            well. The bowel preparation used was MoviPrep via                            split dose instruction. Scope In: 4:18:17 PM Scope Out: 4:28:53 PM Total Procedure Duration: 0 hours 10 minutes 36 seconds  Findings:      Multiple small and  large-mouthed diverticula were found in the left       colon.      The bowel preparation was poor. The examination was carried out to the       level of the transverse colon. Could not be carried out beyond this       secondary to a large left-sided inguinal hernia. Despite reducing the       hernia and applying direct pressure, the colonoscope could not be passed       safely beyond this region. No gross abnormalities of the examined       portions of the colon noted retroflexion was carried out with no       abnormalities of the most distal rectum. Impression:               1. Colonoscopy to the transverse colon revealed                            significant left-sided diverticulosis                           2. Poor prep                           3. Incomplete exam secondary to obstruction from                            large left inguinal hernia. Moderate Sedation:       none Recommendation:           1. Clear liquid diet                           2. Resume Xarelto today                           3. Will discuss with family further work-up of the                            colon. Specifically barium enema                           4. Consider repair of inguinal hernia if symptomatic                           5. EGD today. Please see report. Procedure Code(s):        --- Professional ---                           [phone], Colonoscopy, flexible; diagnostic, including                            collection of specimen(s) by brushing or washing,                            when performed (separate procedure) Diagnosis Code(s):        --- Professional ---  D50.9, Iron deficiency anemia, unspecified                           K57.30, Diverticulosis of large intestine without                            perforation or abscess without bleeding CPT copyright 2019 American Medical Association. All rights reserved. The codes documented in this report are preliminary and upon coder review may  be revised to meet current compliance requirements. Docia Chuck. Henrene Pastor, MD 06/17/2020 4:47:47 PM This report has been signed electronically. Number of Addenda: 0

## 2020-06-17 NOTE — Progress Notes (Addendum)
PROGRESS NOTE  [name] JYN:[id number] DOB: 1933-03-02 DOA: 06/15/2020 PCP: Loraine Leriche., MD  Brief History   86yom sent to ED by PCP for abnl labs. Found to have anemia, hypokalemia, hypercalcemia, hypomagnesemia.   A & P  Symptomatic iron-deficiency anemia. Suspect slow bleed diverticular vs internal hemorrhoids. Last colonoscopy Dr Deatra Ina 2014--severe diverticulosis and internal hemorrhoids; polpys; no repeat study rec'd at that time. --no bleeding in house. Hgb up s/p 2 units PRBC 7/22 but slightly lower today. --EGD/colonoscopy today to evaluate for etiology --GI appreciated --CBC in AM. IV iron prior to discharge  Hypokalemia, hypomagnesemia, hypophosphatemia, hypercalcemia. PMH hyperparathyroidism and hypercalcemia. Complicated by outpt furosemide --K+ WNL --replace phos and Mg --labs in AM  PMH DVT, PE --continue to hold Xarelto --continue SCDs  GERD --continue Protonix  BPH --continue finasteride, silodosin  Dementia --appears stable. Continue memantine  Disposition Plan:  Discussion: overall stable; check CBC in AM; f/u EGD and colonoscopy results. Still w/ electrolyte abnormalities requiring monitoring and replaced.  Status is: Observation  The patient will require care spanning > 2 midnights and should be moved to inpatient because: Persistent severe electrolyte disturbances  Dispo: The patient is from: Home              Anticipated d/c is to: Home              Anticipated d/c date is: 1 day              Patient currently is not medically stable to d/c.  DVT prophylaxis: SCDs Start: 06/16/20 0056 Code Status: Full Family Communication: daughter at beside 7/23  [name], MD  [facility] Direct contact: see [email or web address] (further directions at bottom of note if needed) 7PM-7AM contact night coverage as at bottom of note 06/17/2020, 4:30 PM  LOS: 1 day   Significant Hospital Events   .    Consults:  . GI   Procedures:    .   Significant Diagnostic Tests:  Marland Kitchen    Micro Data:  .    Antimicrobials:  .   Interval History/Subjective  Feels good, no complaints. No bleeding.  Objective   Vitals:  Vitals:   06/17/20 0341 06/17/20 1424  BP: (!) 110/58 (!) 149/69  Pulse: 52 59  Resp: 16 20  Temp: 98.1 F (36.7 C) 98 F (36.7 C)  SpO2: 100% 98%    Exam:  Constitutional:   . Appears calm and comfortable sitting on side of the bed Respiratory:  . CTA bilaterally, no w/r/r.  . Respiratory effort normal.  Cardiovascular:  . RRR, no m/r/g . Telemetry SR Psychiatric:  . Mental status o Mood, affect appropriate  I have personally reviewed the following:   Today's Data  . BMP unremarkable . Phos 1.7 . Mg 1.3 . Hgb slightly down, 7.5  Scheduled Meds: . [MAR Hold] pantoprazole (PROTONIX) IV  40 mg Intravenous Q24H   Continuous Infusions: . lactated ringers 10 mL/hr at 06/17/20 1602    Principal Problem:   Hypokalemia Active Problems:   Hyperlipemia   Hypercalcemia   Essential hypertension   GERD   Memory loss   History of DVT (deep vein thrombosis)   Hypomagnesemia   Symptomatic anemia   LOS: 1 day   How to contact the [facility] Attending or Consulting provider [place] or covering provider during after hours [place], for this patient?  1. Check the care team in [facility] and look for a) attending/consulting [place] provider listed and b) the  [place] team listed 2. Log into [email or web address] and use [place]'s universal password to access. If you do not have the password, please contact the hospital operator. 3. Locate the [facility] provider you are looking for under [facility] and page to a number that you can be directly reached. 4. If you still have difficulty reaching the provider, please page the [facility] (Director on Call) for the Hospitalists listed on amion for assistance.

## 2020-06-17 NOTE — Progress Notes (Addendum)
Patient ID: [name], male   DOB: 1933-06-12, 86 y.o.   MRN: [id number]    Progress Note   Subjective  Day # 2 CC; profound anemia  hgb 7.5 this am- post 2units on admit Ferritin 9/Fe 63/ tibc381/sat 17 K=3.6  Discussion with nurse this a.m., bowel prep completed.  Per check he is having liquid stool this a.m. but not totally clear. Patient resting comfortably, no complaints     Objective   Vital signs in last 24 hours: Temp:  [97.8 F (36.6 C)-98.6 F (37 C)] 98.1 F (36.7 C) (07/23 0341) Pulse Rate:  [47-113] 52 (07/23 0341) Resp:  [15-25] 16 (07/23 0341) BP: (110-157)/(58-76) 110/58 (07/23 0341) SpO2:  [86 %-100 %] 100 % (07/23 0341) Weight:  [74.9 kg] 74.9 kg (07/22 1531) Last BM Date: 06/16/20 General:  eld  white male  in NAD Heart:  Regular rate and rhythm; no murmurs Lungs: Respirations even and unlabored, lungs CTA bilaterally Abdomen:  Soft, nontender and nondistended. Normal bowel sounds. Extremities:  Without edema. Neurologic:  Alert and oriented,  grossly normal neurologically. Psych: sleepy currently  Intake/Output from previous day: 07/22 0701 - 07/23 0700 In: 401.9 [Blood:330; IV Piggyback:71.9] Out: 240 [Urine:240] Intake/Output this shift: No intake/output data recorded.  Lab Results: Recent Labs    06/16/20 0937 06/16/20 1546 06/17/20 0339  WBC 6.2 6.0 6.9  HGB 8.3* 8.1* 7.5*  HCT 26.7* 26.3* 24.7*  PLT 276 287 266   BMET Recent Labs    06/16/20 0937 06/16/20 1546 06/17/20 0339  NA 138 139 138  K 3.3* 3.3* 3.6  CL 101 103 104  CO2 29 28 25   GLUCOSE 95 96 89  BUN 13 11 9   CREATININE 0.81 0.77 0.62  CALCIUM 10.3 10.3 10.2   LFT Recent Labs    06/15/20 2312  PROT 6.6  ALBUMIN 3.5  AST 18  ALT 12  ALKPHOS 70  BILITOT 0.3   PT/INR Recent Labs    06/16/20 0937  LABPROT 13.9  INR 1.1        Assessment / Plan:    #5  86 year old white male admitted with microcytic anemia, hemoglobin in the 6 range.   Heme-negative on admit. Etiology of profound anemia not clear, he is iron deficient.  In setting of chronic Xarelto he certainly may have intermittent slow GI blood loss. Rule out chronic gastropathy, AVMs, neoplasm  Family decided last evening to proceed with endoscopic evaluation, patient was able to complete the prep.  Still not entirely clear this a.m. we will give 2 fleets enemas.  #2 chronic anticoagulation-Xarelto-currently on hold #3 history of DVT/PE #4 dementia #5 chronic GERD #6 history of adenomatous colon polyps last colonoscopy 2014 #7 hyperparathyroidism  Plan; 2 fleets enemas this a.m., keep n.p.o. Patient is scheduled for colonoscopy and EGD with Dr.  Henrene Pastor this afternoon. Start oral iron post procedures.    Principal Problem:   Hypokalemia Active Problems:   Hyperlipemia   Hypercalcemia   Essential hypertension   GERD   Memory loss   History of DVT (deep vein thrombosis)   Hypomagnesemia   Symptomatic anemia     LOS: 1 day   Amy Esterwood PA-C 06/17/2020, 8:31 AM   GI ATTENDING  Interval history and data reviewed.  Agree with comprehensive interval progress note as outlined above.  Elderly gentleman with multiple medical problems who presents with profound symptomatic iron deficiency anemia.  Hemoccult negative.  On chronic Xarelto therapy, which is on hold.  After  consultation with his family he is proceeding with endoscopic evaluations to include colonoscopy and upper endoscopy.  He is HIGH RISK given his comorbidities and the need to interrupt anticoagulation therapy for his procedures.  He will require additional cleansing enemas.  Examination set up for this afternoon.The nature of the procedure, as well as the risks, benefits, and alternatives were carefully and thoroughly reviewed with the patient. Ample time for discussion and questions allowed. The patient understood, was satisfied, and agreed to proceed.  Docia Chuck. Geri [place]., M.D. [facility] Division of Gastroenterology

## 2020-06-17 NOTE — Anesthesia Preprocedure Evaluation (Addendum)
Anesthesia Evaluation  Patient identified by MRN, date of birth, ID band Patient awake    Reviewed: Allergy & Precautions, NPO status , Patient's Chart, lab work & pertinent test results  History of Anesthesia Complications Negative for: history of anesthetic complications  Airway Mallampati: II  TM Distance: >3 FB Neck ROM: Full    Dental  (+) Teeth Intact   Pulmonary former smoker, PE   Pulmonary exam normal        Cardiovascular hypertension, + DVT  Normal cardiovascular exam     Neuro/Psych negative neurological ROS  negative psych ROS   GI/Hepatic Neg liver ROS, GERD  ,  Endo/Other  negative endocrine ROS  Renal/GU negative Renal ROS  negative genitourinary   Musculoskeletal  (+) Arthritis ,   Abdominal   Peds  Hematology  (+) anemia ,   Anesthesia Other Findings   Reproductive/Obstetrics                            Anesthesia Physical Anesthesia Plan  ASA: III  Anesthesia Plan: MAC   Post-op Pain Management:    Induction: Intravenous  PONV Risk Score and Plan: 1 and Propofol infusion, TIVA and Treatment may vary due to age or medical condition  Airway Management Planned: Natural Airway, Nasal Cannula and Simple Face Mask  Additional Equipment: None  Intra-op Plan:   Post-operative Plan:   Informed Consent: I have reviewed the patients History and Physical, chart, labs and discussed the procedure including the risks, benefits and alternatives for the proposed anesthesia with the patient or authorized representative who has indicated his/her understanding and acceptance.       Plan Discussed with:   Anesthesia Plan Comments:         Anesthesia Quick Evaluation

## 2020-06-18 DIAGNOSIS — K4090 Unilateral inguinal hernia, without obstruction or gangrene, not specified as recurrent: Secondary | ICD-10-CM

## 2020-06-18 LAB — BASIC METABOLIC PANEL
Anion gap: 8 (ref 5–15)
BUN: 7 mg/dL — ABNORMAL LOW (ref 8–23)
CO2: 26 mmol/L (ref 22–32)
Calcium: 10.4 mg/dL — ABNORMAL HIGH (ref 8.9–10.3)
Chloride: 106 mmol/L (ref 98–111)
Creatinine, Ser: 0.84 mg/dL (ref 0.61–1.24)
GFR calc Af Amer: >60 mL/min (ref >60)
GFR calc non Af Amer: >60 mL/min (ref >60)
Glucose, Bld: 84 mg/dL (ref 70–99)
Potassium: 3.7 mmol/L (ref 3.5–5.1)
Sodium: 140 mmol/L (ref 135–145)

## 2020-06-18 LAB — MAGNESIUM: Magnesium: 1.6 mg/dL — ABNORMAL LOW (ref 1.7–2.4)

## 2020-06-18 LAB — CBC
HCT: 26.4 % — ABNORMAL LOW (ref 39.0–52.0)
Hemoglobin: 7.9 g/dL — ABNORMAL LOW (ref 13.0–17.0)
MCH: 24 pg — ABNORMAL LOW (ref 26.0–34.0)
MCHC: 29.9 g/dL — ABNORMAL LOW (ref 30.0–36.0)
MCV: 80.2 fL (ref 80.0–100.0)
Platelets: 283 10*3/uL (ref 150–400)
RBC: 3.29 MIL/uL — ABNORMAL LOW (ref 4.22–5.81)
RDW: 17.7 % — ABNORMAL HIGH (ref 11.5–15.5)
WBC: 10.3 10*3/uL (ref 4.0–10.5)
nRBC: 0 % (ref 0.0–0.2)

## 2020-06-18 LAB — PHOSPHORUS: Phosphorus: 3.5 mg/dL (ref 2.5–4.6)

## 2020-06-18 MED ORDER — POTASSIUM CHLORIDE ER 20 MEQ PO TBCR: 20.0000 meq | EXTENDED_RELEASE_TABLET | Freq: Every day | ORAL | 0 refills | Status: DC

## 2020-06-18 MED ORDER — MAGNESIUM OXIDE -MG SUPPLEMENT 200 MG PO TABS: 200.0000 mg | ORAL_TABLET | Freq: Every day | ORAL | 0 refills | Status: AC

## 2020-06-18 MED ORDER — SODIUM CHLORIDE 0.9 % IV SOLN
510.0000 mg | Freq: Once | INTRAVENOUS | Status: AC
  Administered 2020-06-18: 510 mg via INTRAVENOUS
  Filled 2020-06-18: qty 510

## 2020-06-18 MED ORDER — MAGNESIUM SULFATE 2 GM/50ML IV SOLN
2.0000 g | Freq: Once | INTRAVENOUS | Status: AC
  Administered 2020-06-18: 2 g via INTRAVENOUS
  Filled 2020-06-18: qty 50

## 2020-06-18 MED ORDER — MAGNESIUM OXIDE -MG SUPPLEMENT 200 MG PO TABS: 200.0000 mg | ORAL_TABLET | Freq: Every day | ORAL | 0 refills | Status: DC

## 2020-06-18 NOTE — Discharge Summary (Signed)
Physician Discharge Summary  [name] EGB:[id number] DOB: [birth year] DOA: 06/15/2020  PCP: Loraine Leriche., MD  Admit date: 06/15/2020 Discharge date: 06/18/2020  Recommendations for Outpatient Follow-up:  Symptomatic iron-deficiency anemia.  --no bleeding in house. Hgb up s/p 2 units PRBC 7/22 and stable --seen by GI, underwent EGD/colonoscopy which were unrevealing but colonscopy limited by hernia. Family and pt do not want further evaluation of the proximal colon --IV iron infusion given prior to discharge --f/u CBC as an outpatient. Daily iron supplement -- I advised daughter by telephone  Hypokalemia, hypomagnesemia, hypophosphatemia, hypercalcemia. --consider recheck as outpt. KCL Rx refilled.  Large inguinal hernia --patient does not want repair    Follow-up Information    Velazquez, Fransico Meadow., MD Follow up.   Specialty: Internal Medicine Why: as needed Contact information: [place] [address] [phone]                Discharge Diagnoses: Principal diagnosis is #1 1. Symptomatic iron-deficiency anemia. Outpt chronic GI blood loss. 2. Hypokalemia, hypomagnesemia, hypophosphatemia, hypercalcemia 3. Large inguinal hernia 4. PMH DVT, PE 5. GERD 6. BPH 7. Dementia  Discharge Condition: improved Disposition: home  Diet recommendation: low sodium heart healthy  Filed Weights   06/16/20 1531 06/17/20 1424  Weight: 74.9 kg 74.9 kg    History of present illness:  86yom sent to ED by PCP for abnl labs. Found to have anemia, hypokalemia, hypercalcemia, hypomagnesemia.   Hospital Course:  Underwent aggressive repletion of electrolytes with stabilization.  Was seen by gastroenterology for anemia, underwent EGD and colonoscopy which were unrevealing.  Dr. Henrene Pastor with GI discussed further work-up with the family, they declined to proceed with any further evaluation.  Hemoglobin improved appropriately status post blood transfusion  and the patient remained stable post procedure.  Hospitalization was uncomplicated.  Individual issues as below.  Symptomatic iron-deficiency anemia. presume outpt chronic GI blood loss. Last colonoscopy Dr Deatra Ina 2014--severe diverticulosis and internal hemorrhoids; polpys; no repeat study rec'd at that time. --no bleeding in-house. Hgb up s/p 2 units PRBC 7/22 and stable --seen by GI, underwent EGD/colonoscopy which were unrevealing but colonoscopy limited by hernia. Family and pt do not want further evaluation of the proximal colon --IV iron infusion given prior to discharge --f/u CBC as an outpatient. Daily iron supplement.  Hypokalemia, hypomagnesemia, hypophosphatemia, hypercalcemia. PMH hyperparathyroidism and hypercalcemia. Complicated by outpt furosemide --improved w/ repletion  Large inguinal hernia --patient does not want repair  PMH DVT, PE --can resume Xarelto per GI  GERD --continue Protonix  BPH --continue finasteride, silodosin  Dementia --appears stable. Continue memantine   Consults:  . GI   Procedures:  . EGD Impression:               - Normal esophagus.                           - Normal stomach.                           - Normal examined duodenum.                           - No specimens collected. . Colonoscopy  Impression:               1. Colonoscopy to the transverse colon revealed  significant left-sided diverticulosis                           2. Poor prep                           3. Incomplete exam secondary to obstruction from                            large left inguinal hernia. Today's assessment: S: feels fine O: Vitals:  Vitals:   06/17/20 2018 06/18/20 0453  BP: (!) 130/66 (!) 115/50  Pulse: 88 55  Resp: 16 16  Temp: 98.9 F (37.2 C) 98.3 F (36.8 C)  SpO2: 97% 96%    Constitutional:  . Appears calm and comfortable Respiratory:  . CTA bilaterally, no w/r/r.  . Respiratory effort normal.    Cardiovascular:  . RRR, no m/r/g Psychiatric:  . Mental status o Mood, affect appropriate  Hgb stable at 7.9 K+ 3.7 Mg 1.6 Phos 3.5  Discharge Instructions  Discharge Instructions    Diet - low sodium heart healthy   Complete by: As directed    Discharge instructions   Complete by: As directed    Call your physician or seek immediate medical attention for bleeding, weakness, confusion, falls or worsening of condition.   Increase activity slowly   Complete by: As directed      Allergies as of 06/18/2020   No Known Allergies     Medication List    STOP taking these medications   fluticasone 50 MCG/ACT nasal spray Commonly known as: FLONASE     TAKE these medications   atorvastatin 10 MG tablet Commonly known as: LIPITOR TAKE 1 TABLET(10 MG) BY MOUTH DAILY What changed: See the new instructions.   b complex vitamins capsule Take 1 capsule by mouth daily.   cholecalciferol 25 MCG (1000 UNIT) tablet Commonly known as: VITAMIN D3 Take 1,000 Units by mouth daily.   diclofenac sodium 1 % Gel Commonly known as: VOLTAREN Apply topically daily.   donepezil 10 MG tablet Commonly known as: ARICEPT TAKE 1 TABLET(10 MG) BY MOUTH AT BEDTIME What changed: See the new instructions.   finasteride 5 MG tablet Commonly known as: PROSCAR TAKE 1 TABLET(5 MG) BY MOUTH DAILY What changed:   how much to take  how to take this  when to take this  additional instructions   furosemide 40 MG tablet Commonly known as: LASIX TAKE 1 TABLET(40 MG) BY MOUTH DAILY BEFORE BREAKFAST What changed: See the new instructions.   Magnesium Oxide 200 MG Tabs Take 1 tablet (200 mg total) by mouth daily for 14 days.   memantine 10 MG tablet Commonly known as: NAMENDA TAKE 1 TABLET(10 MG) BY MOUTH TWICE DAILY What changed: See the new instructions.   multivitamin with minerals Tabs tablet Take 1 tablet by mouth daily.   pantoprazole 40 MG tablet Commonly known as:  PROTONIX TAKE 1 TABLET(40 MG) BY MOUTH TWICE DAILY What changed:   how much to take  how to take this  when to take this  additional instructions   Potassium Chloride ER 20 MEQ Tbcr Take 20 mEq by mouth daily. Patient needs an appointment for further refills. What changed:   medication strength  how much to take   silodosin 8 MG Caps capsule Commonly known as: Rapaflo Take 1 capsule (8 mg total) by mouth  daily with breakfast.   Xarelto 20 MG Tabs tablet Generic drug: rivaroxaban TAKE 1 TABLET(20 MG) BY MOUTH DAILY WITH SUPPER What changed: See the new instructions.      No Known Allergies  The results of significant diagnostics from this hospitalization (including imaging, microbiology, ancillary and laboratory) are listed below for reference.    Significant Diagnostic Studies: No results found.  Microbiology: Recent Results (from the past 240 hour(s))  SARS Coronavirus 2 by RT PCR (hospital order, performed in [facility] hospital lab) Nasopharyngeal Nasopharyngeal Swab     Status: None   Collection Time: 06/16/20 12:58 AM   Specimen: Nasopharyngeal Swab  Result Value Ref Range Status   SARS Coronavirus 2 NEGATIVE NEGATIVE Final    Comment: (NOTE) SARS-CoV-2 target nucleic acids are NOT DETECTED.  The SARS-CoV-2 RNA is generally detectable in upper and lower respiratory specimens during the acute phase of infection. The lowest concentration of SARS-CoV-2 viral copies this assay can detect is 250 copies / mL. A negative result does not preclude SARS-CoV-2 infection and should not be used as the sole basis for treatment or other patient management decisions.  A negative result may occur with improper specimen collection / handling, submission of specimen other than nasopharyngeal swab, presence of viral mutation(s) within the areas targeted by this assay, and inadequate number of viral copies (<250 copies / mL). A negative result must be combined with  clinical observations, patient history, and epidemiological information.  Fact Sheet for Patients:   StrictlyIdeas.no  Fact Sheet for Healthcare Providers: BankingDealers.co.za  This test is not yet approved or  cleared by the [place] FDA and has been authorized for detection and/or diagnosis of SARS-CoV-2 by FDA under an Emergency Use Authorization (EUA).  This EUA will remain in effect (meaning this test can be used) for the duration of the COVID-19 declaration under Section 564(b)(1) of the Act, 21 U.S.C. section 360bbb-3(b)(1), unless the authorization is terminated or revoked sooner.  Performed at [facility], [place] [address] [place], [place] [address]      Labs: Basic Metabolic Panel: Recent Labs  Lab 06/15/20 2312 06/16/20 0937 06/16/20 1546 06/17/20 0339 06/18/20 0435  NA 141 138 139 138 140  K 2.5* 3.3* 3.3* 3.6 3.7  CL 94* 101 103 104 106  CO2 33* 29 28 25 26   GLUCOSE 101* 95 96 89 84  BUN 14 13 11 9  7*  CREATININE 0.98 0.81 0.77 0.62 0.84  CALCIUM 11.2* 10.3 10.3 10.2 10.4*  MG 1.2*  --   --  1.3* 1.6*  PHOS  --  1.9*  --  1.7* 3.5   Liver Function Tests: Recent Labs  Lab 06/15/20 2312  AST 18  ALT 12  ALKPHOS 70  BILITOT 0.3  PROT 6.6  ALBUMIN 3.5   CBC: Recent Labs  Lab 06/15/20 2312 06/16/20 0937 06/16/20 1546 06/17/20 0339 06/18/20 0435  WBC 5.1 6.2 6.0 6.9 10.3  HGB 6.2* 8.3* 8.1* 7.5* 7.9*  HCT 20.5* 26.7* 26.3* 24.7* 26.4*  MCV 77.1* 78.5* 79.0* 79.9* 80.2  PLT 315 276 287 266 283    Principal Problem:   Hypokalemia Active Problems:   Hyperlipemia   Hypercalcemia   Essential hypertension   GERD   Memory loss   History of DVT (deep vein thrombosis)   Hypomagnesemia   Symptomatic anemia   Iron deficiency anemia due to chronic blood loss   Diverticulosis of colon without hemorrhage   Esophageal stricture   Time coordinating discharge:  25  minutes  Signed:  [name], MD  [facility]  06/18/2020, 6:27 PM

## 2020-06-18 NOTE — Plan of Care (Signed)
Pt finishing iron infusion and is ready for DC home with family.

## 2020-06-18 NOTE — Progress Notes (Signed)
GI ATTENDING  I spoke with the patient's wife and daughter post-procedure. They do not want further evaluation of the proximal colon or repair of large inguinal hernia. Regular diet, resume Xarelto, daily PPI to protect upper GI mucosa long term, and daily iron supplement. Will sign off.  Docia Chuck. Geri Seminole., M.D. [facility] Division of Gastroenterology

## 2020-06-19 ENCOUNTER — Encounter (HOSPITAL_COMMUNITY): Payer: Self-pay | Admitting: Internal Medicine

## 2020-06-19 LAB — TYPE AND SCREEN
ABO/RH(D): O POS
Antibody Screen: NEGATIVE
Unit division: 0
Unit division: 0
Unit division: 0

## 2020-06-19 LAB — BPAM RBC
Blood Product Expiration Date: 202108162359
Blood Product Expiration Date: 202108162359
Blood Product Expiration Date: 202108172359
ISSUE DATE / TIME: 202107220119
ISSUE DATE / TIME: 202107220530
Unit Type and Rh: 5100
Unit Type and Rh: 5100
Unit Type and Rh: 5100

## 2020-06-21 ENCOUNTER — Encounter (HOSPITAL_COMMUNITY): Payer: Self-pay | Admitting: Internal Medicine

## 2020-06-21 NOTE — Anesthesia Postprocedure Evaluation (Signed)
Anesthesia Post Note  Patient: [name]  Procedure(s) Performed: COLONOSCOPY WITH PROPOFOL (N/A ) ESOPHAGOGASTRODUODENOSCOPY (EGD) WITH PROPOFOL (N/A )     Patient location during evaluation: Endoscopy Anesthesia Type: MAC Level of consciousness: awake and alert Pain management: pain level controlled Vital Signs Assessment: post-procedure vital signs reviewed and stable Respiratory status: spontaneous breathing, nonlabored ventilation and respiratory function stable Cardiovascular status: blood pressure returned to baseline and stable Postop Assessment: no apparent nausea or vomiting Anesthetic complications: no   No complications documented.  Last Vitals:  Vitals:   06/17/20 2018 06/18/20 0453  BP: (!) 130/66 (!) 115/50  Pulse: 88 55  Resp: 16 16  Temp: 37.2 C 36.8 C  SpO2: 97% 96%    Last Pain:  Vitals:   06/17/20 2018  TempSrc:   PainSc: 0-No pain   Pain Goal:                   Lidia Collum

## 2020-06-23 DIAGNOSIS — Z789 Other specified health status: Secondary | ICD-10-CM | POA: Diagnosis not present

## 2020-06-23 DIAGNOSIS — R262 Difficulty in walking, not elsewhere classified: Secondary | ICD-10-CM | POA: Diagnosis not present

## 2020-06-23 DIAGNOSIS — M170 Bilateral primary osteoarthritis of knee: Secondary | ICD-10-CM | POA: Diagnosis not present

## 2020-06-23 DIAGNOSIS — R54 Age-related physical debility: Secondary | ICD-10-CM | POA: Diagnosis not present

## 2020-06-23 DIAGNOSIS — R29898 Other symptoms and signs involving the musculoskeletal system: Secondary | ICD-10-CM | POA: Diagnosis not present

## 2020-06-24 DIAGNOSIS — E876 Hypokalemia: Secondary | ICD-10-CM | POA: Diagnosis not present

## 2020-06-24 DIAGNOSIS — D500 Iron deficiency anemia secondary to blood loss (chronic): Secondary | ICD-10-CM | POA: Diagnosis not present

## 2020-06-24 DIAGNOSIS — D649 Anemia, unspecified: Secondary | ICD-10-CM | POA: Diagnosis not present

## 2020-06-27 DIAGNOSIS — F0390 Unspecified dementia without behavioral disturbance: Secondary | ICD-10-CM | POA: Diagnosis not present

## 2020-06-27 DIAGNOSIS — R413 Other amnesia: Secondary | ICD-10-CM | POA: Diagnosis not present

## 2020-07-05 DIAGNOSIS — B351 Tinea unguium: Secondary | ICD-10-CM | POA: Diagnosis not present

## 2020-07-14 DIAGNOSIS — I824Z3 Acute embolism and thrombosis of unspecified deep veins of distal lower extremity, bilateral: Secondary | ICD-10-CM | POA: Diagnosis not present

## 2020-07-14 DIAGNOSIS — D500 Iron deficiency anemia secondary to blood loss (chronic): Secondary | ICD-10-CM | POA: Diagnosis not present

## 2020-07-18 DIAGNOSIS — R9082 White matter disease, unspecified: Secondary | ICD-10-CM | POA: Diagnosis not present

## 2020-07-18 DIAGNOSIS — R413 Other amnesia: Secondary | ICD-10-CM | POA: Diagnosis not present

## 2020-07-18 DIAGNOSIS — G319 Degenerative disease of nervous system, unspecified: Secondary | ICD-10-CM | POA: Diagnosis not present

## 2020-07-19 DIAGNOSIS — R5383 Other fatigue: Secondary | ICD-10-CM | POA: Diagnosis not present

## 2020-07-19 DIAGNOSIS — D500 Iron deficiency anemia secondary to blood loss (chronic): Secondary | ICD-10-CM | POA: Diagnosis not present

## 2020-07-20 DIAGNOSIS — E860 Dehydration: Secondary | ICD-10-CM | POA: Diagnosis not present

## 2020-07-22 DIAGNOSIS — F0390 Unspecified dementia without behavioral disturbance: Secondary | ICD-10-CM | POA: Diagnosis present

## 2020-07-22 DIAGNOSIS — E876 Hypokalemia: Secondary | ICD-10-CM | POA: Diagnosis present

## 2020-07-22 DIAGNOSIS — E210 Primary hyperparathyroidism: Secondary | ICD-10-CM | POA: Diagnosis present

## 2020-07-22 DIAGNOSIS — Z9114 Patient's other noncompliance with medication regimen: Secondary | ICD-10-CM | POA: Diagnosis not present

## 2020-07-22 DIAGNOSIS — N400 Enlarged prostate without lower urinary tract symptoms: Secondary | ICD-10-CM | POA: Diagnosis not present

## 2020-07-22 DIAGNOSIS — G309 Alzheimer's disease, unspecified: Secondary | ICD-10-CM | POA: Diagnosis not present

## 2020-07-22 DIAGNOSIS — R531 Weakness: Secondary | ICD-10-CM | POA: Diagnosis not present

## 2020-07-22 DIAGNOSIS — R918 Other nonspecific abnormal finding of lung field: Secondary | ICD-10-CM | POA: Diagnosis not present

## 2020-07-22 DIAGNOSIS — Z23 Encounter for immunization: Secondary | ICD-10-CM | POA: Diagnosis not present

## 2020-07-22 DIAGNOSIS — E860 Dehydration: Secondary | ICD-10-CM | POA: Diagnosis not present

## 2020-07-22 DIAGNOSIS — E785 Hyperlipidemia, unspecified: Secondary | ICD-10-CM | POA: Diagnosis present

## 2020-07-22 DIAGNOSIS — F0280 Dementia in other diseases classified elsewhere without behavioral disturbance: Secondary | ICD-10-CM | POA: Diagnosis not present

## 2020-07-22 DIAGNOSIS — I10 Essential (primary) hypertension: Secondary | ICD-10-CM | POA: Diagnosis not present

## 2020-07-22 DIAGNOSIS — R2689 Other abnormalities of gait and mobility: Secondary | ICD-10-CM | POA: Diagnosis present

## 2020-07-27 DIAGNOSIS — Z9181 History of falling: Secondary | ICD-10-CM | POA: Diagnosis not present

## 2020-07-27 DIAGNOSIS — E876 Hypokalemia: Secondary | ICD-10-CM | POA: Diagnosis not present

## 2020-07-27 DIAGNOSIS — I509 Heart failure, unspecified: Secondary | ICD-10-CM | POA: Diagnosis not present

## 2020-07-27 DIAGNOSIS — I110 Hypertensive heart disease with heart failure: Secondary | ICD-10-CM | POA: Diagnosis not present

## 2020-07-27 DIAGNOSIS — E210 Primary hyperparathyroidism: Secondary | ICD-10-CM | POA: Diagnosis not present

## 2020-07-27 DIAGNOSIS — F0280 Dementia in other diseases classified elsewhere without behavioral disturbance: Secondary | ICD-10-CM | POA: Diagnosis not present

## 2020-07-27 DIAGNOSIS — D649 Anemia, unspecified: Secondary | ICD-10-CM | POA: Diagnosis not present

## 2020-07-27 DIAGNOSIS — I10 Essential (primary) hypertension: Secondary | ICD-10-CM | POA: Diagnosis not present

## 2020-07-27 DIAGNOSIS — Z87891 Personal history of nicotine dependence: Secondary | ICD-10-CM | POA: Diagnosis not present

## 2020-07-28 DIAGNOSIS — F0280 Dementia in other diseases classified elsewhere without behavioral disturbance: Secondary | ICD-10-CM | POA: Diagnosis not present

## 2020-07-28 DIAGNOSIS — I110 Hypertensive heart disease with heart failure: Secondary | ICD-10-CM | POA: Diagnosis not present

## 2020-07-28 DIAGNOSIS — D649 Anemia, unspecified: Secondary | ICD-10-CM | POA: Diagnosis not present

## 2020-07-28 DIAGNOSIS — I509 Heart failure, unspecified: Secondary | ICD-10-CM | POA: Diagnosis not present

## 2020-07-28 DIAGNOSIS — E210 Primary hyperparathyroidism: Secondary | ICD-10-CM | POA: Diagnosis not present

## 2020-08-01 DIAGNOSIS — I509 Heart failure, unspecified: Secondary | ICD-10-CM | POA: Diagnosis not present

## 2020-08-01 DIAGNOSIS — E210 Primary hyperparathyroidism: Secondary | ICD-10-CM | POA: Diagnosis not present

## 2020-08-01 DIAGNOSIS — F0280 Dementia in other diseases classified elsewhere without behavioral disturbance: Secondary | ICD-10-CM | POA: Diagnosis not present

## 2020-08-01 DIAGNOSIS — I110 Hypertensive heart disease with heart failure: Secondary | ICD-10-CM | POA: Diagnosis not present

## 2020-08-01 DIAGNOSIS — D649 Anemia, unspecified: Secondary | ICD-10-CM | POA: Diagnosis not present

## 2020-08-03 DIAGNOSIS — D500 Iron deficiency anemia secondary to blood loss (chronic): Secondary | ICD-10-CM | POA: Diagnosis not present

## 2020-08-03 DIAGNOSIS — I110 Hypertensive heart disease with heart failure: Secondary | ICD-10-CM | POA: Diagnosis not present

## 2020-08-03 DIAGNOSIS — E210 Primary hyperparathyroidism: Secondary | ICD-10-CM | POA: Diagnosis not present

## 2020-08-03 DIAGNOSIS — D649 Anemia, unspecified: Secondary | ICD-10-CM | POA: Diagnosis not present

## 2020-08-03 DIAGNOSIS — F0280 Dementia in other diseases classified elsewhere without behavioral disturbance: Secondary | ICD-10-CM | POA: Diagnosis not present

## 2020-08-03 DIAGNOSIS — I509 Heart failure, unspecified: Secondary | ICD-10-CM | POA: Diagnosis not present

## 2020-08-04 DIAGNOSIS — Z87891 Personal history of nicotine dependence: Secondary | ICD-10-CM | POA: Diagnosis not present

## 2020-08-04 DIAGNOSIS — E876 Hypokalemia: Secondary | ICD-10-CM | POA: Diagnosis not present

## 2020-08-04 DIAGNOSIS — I509 Heart failure, unspecified: Secondary | ICD-10-CM | POA: Diagnosis not present

## 2020-08-04 DIAGNOSIS — Z9181 History of falling: Secondary | ICD-10-CM | POA: Diagnosis not present

## 2020-08-04 DIAGNOSIS — D649 Anemia, unspecified: Secondary | ICD-10-CM | POA: Diagnosis not present

## 2020-08-04 DIAGNOSIS — E210 Primary hyperparathyroidism: Secondary | ICD-10-CM | POA: Diagnosis not present

## 2020-08-04 DIAGNOSIS — I110 Hypertensive heart disease with heart failure: Secondary | ICD-10-CM | POA: Diagnosis not present

## 2020-08-04 DIAGNOSIS — F0280 Dementia in other diseases classified elsewhere without behavioral disturbance: Secondary | ICD-10-CM | POA: Diagnosis not present

## 2020-08-08 DIAGNOSIS — E213 Hyperparathyroidism, unspecified: Secondary | ICD-10-CM | POA: Diagnosis not present

## 2020-08-09 DIAGNOSIS — F0280 Dementia in other diseases classified elsewhere without behavioral disturbance: Secondary | ICD-10-CM | POA: Diagnosis not present

## 2020-08-09 DIAGNOSIS — D649 Anemia, unspecified: Secondary | ICD-10-CM | POA: Diagnosis not present

## 2020-08-09 DIAGNOSIS — I110 Hypertensive heart disease with heart failure: Secondary | ICD-10-CM | POA: Diagnosis not present

## 2020-08-09 DIAGNOSIS — I509 Heart failure, unspecified: Secondary | ICD-10-CM | POA: Diagnosis not present

## 2020-08-09 DIAGNOSIS — E210 Primary hyperparathyroidism: Secondary | ICD-10-CM | POA: Diagnosis not present

## 2020-08-10 DIAGNOSIS — D649 Anemia, unspecified: Secondary | ICD-10-CM | POA: Diagnosis not present

## 2020-08-10 DIAGNOSIS — I110 Hypertensive heart disease with heart failure: Secondary | ICD-10-CM | POA: Diagnosis not present

## 2020-08-10 DIAGNOSIS — E210 Primary hyperparathyroidism: Secondary | ICD-10-CM | POA: Diagnosis not present

## 2020-08-10 DIAGNOSIS — I509 Heart failure, unspecified: Secondary | ICD-10-CM | POA: Diagnosis not present

## 2020-08-10 DIAGNOSIS — F0280 Dementia in other diseases classified elsewhere without behavioral disturbance: Secondary | ICD-10-CM | POA: Diagnosis not present

## 2020-08-12 DIAGNOSIS — F0280 Dementia in other diseases classified elsewhere without behavioral disturbance: Secondary | ICD-10-CM | POA: Diagnosis not present

## 2020-08-12 DIAGNOSIS — D649 Anemia, unspecified: Secondary | ICD-10-CM | POA: Diagnosis not present

## 2020-08-12 DIAGNOSIS — I509 Heart failure, unspecified: Secondary | ICD-10-CM | POA: Diagnosis not present

## 2020-08-12 DIAGNOSIS — I110 Hypertensive heart disease with heart failure: Secondary | ICD-10-CM | POA: Diagnosis not present

## 2020-08-12 DIAGNOSIS — E210 Primary hyperparathyroidism: Secondary | ICD-10-CM | POA: Diagnosis not present

## 2020-08-16 DIAGNOSIS — I110 Hypertensive heart disease with heart failure: Secondary | ICD-10-CM | POA: Diagnosis not present

## 2020-08-16 DIAGNOSIS — E210 Primary hyperparathyroidism: Secondary | ICD-10-CM | POA: Diagnosis not present

## 2020-08-16 DIAGNOSIS — I509 Heart failure, unspecified: Secondary | ICD-10-CM | POA: Diagnosis not present

## 2020-08-16 DIAGNOSIS — F0280 Dementia in other diseases classified elsewhere without behavioral disturbance: Secondary | ICD-10-CM | POA: Diagnosis not present

## 2020-08-16 DIAGNOSIS — D649 Anemia, unspecified: Secondary | ICD-10-CM | POA: Diagnosis not present

## 2020-08-17 DIAGNOSIS — I110 Hypertensive heart disease with heart failure: Secondary | ICD-10-CM | POA: Diagnosis not present

## 2020-08-17 DIAGNOSIS — D649 Anemia, unspecified: Secondary | ICD-10-CM | POA: Diagnosis not present

## 2020-08-17 DIAGNOSIS — E210 Primary hyperparathyroidism: Secondary | ICD-10-CM | POA: Diagnosis not present

## 2020-08-17 DIAGNOSIS — F0280 Dementia in other diseases classified elsewhere without behavioral disturbance: Secondary | ICD-10-CM | POA: Diagnosis not present

## 2020-08-17 DIAGNOSIS — I509 Heart failure, unspecified: Secondary | ICD-10-CM | POA: Diagnosis not present

## 2020-08-18 DIAGNOSIS — F0280 Dementia in other diseases classified elsewhere without behavioral disturbance: Secondary | ICD-10-CM | POA: Diagnosis not present

## 2020-08-18 DIAGNOSIS — E210 Primary hyperparathyroidism: Secondary | ICD-10-CM | POA: Diagnosis not present

## 2020-08-18 DIAGNOSIS — D649 Anemia, unspecified: Secondary | ICD-10-CM | POA: Diagnosis not present

## 2020-08-18 DIAGNOSIS — I110 Hypertensive heart disease with heart failure: Secondary | ICD-10-CM | POA: Diagnosis not present

## 2020-08-18 DIAGNOSIS — I509 Heart failure, unspecified: Secondary | ICD-10-CM | POA: Diagnosis not present

## 2020-08-22 DIAGNOSIS — N179 Acute kidney failure, unspecified: Secondary | ICD-10-CM | POA: Diagnosis not present

## 2020-08-22 DIAGNOSIS — N289 Disorder of kidney and ureter, unspecified: Secondary | ICD-10-CM | POA: Diagnosis not present

## 2020-08-22 DIAGNOSIS — N184 Chronic kidney disease, stage 4 (severe): Secondary | ICD-10-CM | POA: Diagnosis not present

## 2020-08-22 DIAGNOSIS — E210 Primary hyperparathyroidism: Secondary | ICD-10-CM | POA: Diagnosis not present

## 2020-08-22 DIAGNOSIS — E785 Hyperlipidemia, unspecified: Secondary | ICD-10-CM | POA: Diagnosis not present

## 2020-08-22 DIAGNOSIS — Z8639 Personal history of other endocrine, nutritional and metabolic disease: Secondary | ICD-10-CM | POA: Diagnosis not present

## 2020-08-22 DIAGNOSIS — D351 Benign neoplasm of parathyroid gland: Secondary | ICD-10-CM | POA: Diagnosis not present

## 2020-08-23 DIAGNOSIS — E213 Hyperparathyroidism, unspecified: Secondary | ICD-10-CM | POA: Diagnosis not present

## 2020-08-23 DIAGNOSIS — N289 Disorder of kidney and ureter, unspecified: Secondary | ICD-10-CM | POA: Diagnosis not present

## 2020-08-23 DIAGNOSIS — Z8639 Personal history of other endocrine, nutritional and metabolic disease: Secondary | ICD-10-CM | POA: Diagnosis not present

## 2020-08-24 DIAGNOSIS — I509 Heart failure, unspecified: Secondary | ICD-10-CM | POA: Diagnosis not present

## 2020-08-24 DIAGNOSIS — Z9181 History of falling: Secondary | ICD-10-CM | POA: Diagnosis not present

## 2020-08-24 DIAGNOSIS — Z9049 Acquired absence of other specified parts of digestive tract: Secondary | ICD-10-CM | POA: Diagnosis not present

## 2020-08-24 DIAGNOSIS — R001 Bradycardia, unspecified: Secondary | ICD-10-CM | POA: Diagnosis not present

## 2020-08-24 DIAGNOSIS — N289 Disorder of kidney and ureter, unspecified: Secondary | ICD-10-CM | POA: Diagnosis not present

## 2020-08-24 DIAGNOSIS — I10 Essential (primary) hypertension: Secondary | ICD-10-CM | POA: Diagnosis not present

## 2020-08-24 DIAGNOSIS — Z79899 Other long term (current) drug therapy: Secondary | ICD-10-CM | POA: Diagnosis not present

## 2020-08-24 DIAGNOSIS — E860 Dehydration: Secondary | ICD-10-CM | POA: Diagnosis present

## 2020-08-24 DIAGNOSIS — N184 Chronic kidney disease, stage 4 (severe): Secondary | ICD-10-CM | POA: Diagnosis present

## 2020-08-24 DIAGNOSIS — E215 Disorder of parathyroid gland, unspecified: Secondary | ICD-10-CM | POA: Diagnosis not present

## 2020-08-24 DIAGNOSIS — D649 Anemia, unspecified: Secondary | ICD-10-CM | POA: Diagnosis not present

## 2020-08-24 DIAGNOSIS — E876 Hypokalemia: Secondary | ICD-10-CM | POA: Diagnosis present

## 2020-08-24 DIAGNOSIS — Z87891 Personal history of nicotine dependence: Secondary | ICD-10-CM | POA: Diagnosis not present

## 2020-08-24 DIAGNOSIS — I517 Cardiomegaly: Secondary | ICD-10-CM | POA: Diagnosis not present

## 2020-08-24 DIAGNOSIS — E785 Hyperlipidemia, unspecified: Secondary | ICD-10-CM | POA: Diagnosis present

## 2020-08-24 DIAGNOSIS — I110 Hypertensive heart disease with heart failure: Secondary | ICD-10-CM | POA: Diagnosis not present

## 2020-08-24 DIAGNOSIS — I129 Hypertensive chronic kidney disease with stage 1 through stage 4 chronic kidney disease, or unspecified chronic kidney disease: Secondary | ICD-10-CM | POA: Diagnosis present

## 2020-08-24 DIAGNOSIS — F0390 Unspecified dementia without behavioral disturbance: Secondary | ICD-10-CM | POA: Diagnosis present

## 2020-08-24 DIAGNOSIS — I452 Bifascicular block: Secondary | ICD-10-CM | POA: Diagnosis not present

## 2020-08-24 DIAGNOSIS — K219 Gastro-esophageal reflux disease without esophagitis: Secondary | ICD-10-CM | POA: Diagnosis present

## 2020-08-24 DIAGNOSIS — F0280 Dementia in other diseases classified elsewhere without behavioral disturbance: Secondary | ICD-10-CM | POA: Diagnosis not present

## 2020-08-24 DIAGNOSIS — D351 Benign neoplasm of parathyroid gland: Secondary | ICD-10-CM | POA: Diagnosis present

## 2020-08-24 DIAGNOSIS — E210 Primary hyperparathyroidism: Secondary | ICD-10-CM | POA: Diagnosis present

## 2020-08-24 DIAGNOSIS — Z86718 Personal history of other venous thrombosis and embolism: Secondary | ICD-10-CM | POA: Diagnosis not present

## 2020-08-24 DIAGNOSIS — N179 Acute kidney failure, unspecified: Secondary | ICD-10-CM | POA: Diagnosis present

## 2020-08-24 DIAGNOSIS — D500 Iron deficiency anemia secondary to blood loss (chronic): Secondary | ICD-10-CM | POA: Diagnosis not present

## 2020-08-26 DIAGNOSIS — F0280 Dementia in other diseases classified elsewhere without behavioral disturbance: Secondary | ICD-10-CM | POA: Diagnosis not present

## 2020-08-26 DIAGNOSIS — I509 Heart failure, unspecified: Secondary | ICD-10-CM | POA: Diagnosis not present

## 2020-08-26 DIAGNOSIS — D649 Anemia, unspecified: Secondary | ICD-10-CM | POA: Diagnosis not present

## 2020-08-26 DIAGNOSIS — E210 Primary hyperparathyroidism: Secondary | ICD-10-CM | POA: Diagnosis not present

## 2020-08-26 DIAGNOSIS — I110 Hypertensive heart disease with heart failure: Secondary | ICD-10-CM | POA: Diagnosis not present

## 2020-08-30 DIAGNOSIS — E210 Primary hyperparathyroidism: Secondary | ICD-10-CM | POA: Diagnosis not present

## 2020-08-30 DIAGNOSIS — D500 Iron deficiency anemia secondary to blood loss (chronic): Secondary | ICD-10-CM | POA: Diagnosis not present

## 2020-09-14 DIAGNOSIS — E876 Hypokalemia: Secondary | ICD-10-CM | POA: Diagnosis not present

## 2020-09-14 DIAGNOSIS — E871 Hypo-osmolality and hyponatremia: Secondary | ICD-10-CM | POA: Diagnosis not present

## 2020-09-14 DIAGNOSIS — E119 Type 2 diabetes mellitus without complications: Secondary | ICD-10-CM | POA: Diagnosis not present

## 2020-09-14 DIAGNOSIS — E213 Hyperparathyroidism, unspecified: Secondary | ICD-10-CM | POA: Diagnosis not present

## 2020-09-14 DIAGNOSIS — N179 Acute kidney failure, unspecified: Secondary | ICD-10-CM | POA: Diagnosis not present

## 2020-09-16 DIAGNOSIS — N179 Acute kidney failure, unspecified: Secondary | ICD-10-CM | POA: Diagnosis not present

## 2020-09-16 DIAGNOSIS — N390 Urinary tract infection, site not specified: Secondary | ICD-10-CM | POA: Diagnosis not present

## 2020-09-16 DIAGNOSIS — E876 Hypokalemia: Secondary | ICD-10-CM | POA: Diagnosis not present

## 2020-09-20 DIAGNOSIS — Z23 Encounter for immunization: Secondary | ICD-10-CM | POA: Diagnosis not present

## 2020-09-25 DIAGNOSIS — I110 Hypertensive heart disease with heart failure: Secondary | ICD-10-CM | POA: Diagnosis not present

## 2020-09-25 DIAGNOSIS — I509 Heart failure, unspecified: Secondary | ICD-10-CM | POA: Diagnosis not present

## 2020-09-25 DIAGNOSIS — Z48810 Encounter for surgical aftercare following surgery on the sense organs: Secondary | ICD-10-CM | POA: Diagnosis not present

## 2020-09-25 DIAGNOSIS — Z4889 Encounter for other specified surgical aftercare: Secondary | ICD-10-CM | POA: Diagnosis not present

## 2020-09-25 DIAGNOSIS — E876 Hypokalemia: Secondary | ICD-10-CM | POA: Diagnosis not present

## 2020-09-25 DIAGNOSIS — F0280 Dementia in other diseases classified elsewhere without behavioral disturbance: Secondary | ICD-10-CM | POA: Diagnosis not present

## 2020-09-25 DIAGNOSIS — D500 Iron deficiency anemia secondary to blood loss (chronic): Secondary | ICD-10-CM | POA: Diagnosis not present

## 2020-09-25 DIAGNOSIS — E210 Primary hyperparathyroidism: Secondary | ICD-10-CM | POA: Diagnosis not present

## 2020-09-25 DIAGNOSIS — Z9089 Acquired absence of other organs: Secondary | ICD-10-CM | POA: Diagnosis not present

## 2020-09-25 DIAGNOSIS — Z9181 History of falling: Secondary | ICD-10-CM | POA: Diagnosis not present

## 2020-09-25 DIAGNOSIS — E785 Hyperlipidemia, unspecified: Secondary | ICD-10-CM | POA: Diagnosis not present

## 2020-09-25 DIAGNOSIS — D649 Anemia, unspecified: Secondary | ICD-10-CM | POA: Diagnosis not present

## 2020-09-25 DIAGNOSIS — F0390 Unspecified dementia without behavioral disturbance: Secondary | ICD-10-CM | POA: Diagnosis not present

## 2020-09-25 DIAGNOSIS — Z87891 Personal history of nicotine dependence: Secondary | ICD-10-CM | POA: Diagnosis not present

## 2020-09-26 DIAGNOSIS — R296 Repeated falls: Secondary | ICD-10-CM | POA: Diagnosis not present

## 2020-09-26 DIAGNOSIS — E210 Primary hyperparathyroidism: Secondary | ICD-10-CM | POA: Diagnosis not present

## 2020-09-26 DIAGNOSIS — R634 Abnormal weight loss: Secondary | ICD-10-CM | POA: Diagnosis not present

## 2020-09-26 DIAGNOSIS — D500 Iron deficiency anemia secondary to blood loss (chronic): Secondary | ICD-10-CM | POA: Diagnosis not present

## 2020-09-26 DIAGNOSIS — R54 Age-related physical debility: Secondary | ICD-10-CM | POA: Diagnosis not present

## 2020-09-26 DIAGNOSIS — F0390 Unspecified dementia without behavioral disturbance: Secondary | ICD-10-CM | POA: Diagnosis not present

## 2020-09-26 DIAGNOSIS — E876 Hypokalemia: Secondary | ICD-10-CM | POA: Diagnosis not present

## 2020-09-26 DIAGNOSIS — E892 Postprocedural hypoparathyroidism: Secondary | ICD-10-CM | POA: Diagnosis not present

## 2020-09-27 DIAGNOSIS — Z4889 Encounter for other specified surgical aftercare: Secondary | ICD-10-CM | POA: Diagnosis not present

## 2020-09-27 DIAGNOSIS — I509 Heart failure, unspecified: Secondary | ICD-10-CM | POA: Diagnosis not present

## 2020-09-27 DIAGNOSIS — I110 Hypertensive heart disease with heart failure: Secondary | ICD-10-CM | POA: Diagnosis not present

## 2020-09-27 DIAGNOSIS — E210 Primary hyperparathyroidism: Secondary | ICD-10-CM | POA: Diagnosis not present

## 2020-09-27 DIAGNOSIS — F0280 Dementia in other diseases classified elsewhere without behavioral disturbance: Secondary | ICD-10-CM | POA: Diagnosis not present

## 2020-09-28 DIAGNOSIS — I110 Hypertensive heart disease with heart failure: Secondary | ICD-10-CM | POA: Diagnosis not present

## 2020-09-28 DIAGNOSIS — Z4889 Encounter for other specified surgical aftercare: Secondary | ICD-10-CM | POA: Diagnosis not present

## 2020-09-28 DIAGNOSIS — F0280 Dementia in other diseases classified elsewhere without behavioral disturbance: Secondary | ICD-10-CM | POA: Diagnosis not present

## 2020-09-28 DIAGNOSIS — I509 Heart failure, unspecified: Secondary | ICD-10-CM | POA: Diagnosis not present

## 2020-09-28 DIAGNOSIS — E210 Primary hyperparathyroidism: Secondary | ICD-10-CM | POA: Diagnosis not present

## 2020-09-29 DIAGNOSIS — E876 Hypokalemia: Secondary | ICD-10-CM | POA: Diagnosis not present

## 2020-09-29 DIAGNOSIS — R5383 Other fatigue: Secondary | ICD-10-CM | POA: Diagnosis not present

## 2020-09-29 DIAGNOSIS — Z681 Body mass index (BMI) 19 or less, adult: Secondary | ICD-10-CM | POA: Diagnosis not present

## 2020-09-29 DIAGNOSIS — I129 Hypertensive chronic kidney disease with stage 1 through stage 4 chronic kidney disease, or unspecified chronic kidney disease: Secondary | ICD-10-CM | POA: Diagnosis not present

## 2020-09-29 DIAGNOSIS — N183 Chronic kidney disease, stage 3 unspecified: Secondary | ICD-10-CM | POA: Diagnosis not present

## 2020-09-29 DIAGNOSIS — Z86718 Personal history of other venous thrombosis and embolism: Secondary | ICD-10-CM | POA: Diagnosis not present

## 2020-09-29 DIAGNOSIS — M1990 Unspecified osteoarthritis, unspecified site: Secondary | ICD-10-CM | POA: Diagnosis not present

## 2020-09-29 DIAGNOSIS — F0390 Unspecified dementia without behavioral disturbance: Secondary | ICD-10-CM | POA: Diagnosis not present

## 2020-09-29 DIAGNOSIS — E860 Dehydration: Secondary | ICD-10-CM | POA: Diagnosis not present

## 2020-09-29 DIAGNOSIS — E441 Mild protein-calorie malnutrition: Secondary | ICD-10-CM | POA: Diagnosis not present

## 2020-09-29 DIAGNOSIS — H9190 Unspecified hearing loss, unspecified ear: Secondary | ICD-10-CM | POA: Diagnosis not present

## 2020-09-29 DIAGNOSIS — D649 Anemia, unspecified: Secondary | ICD-10-CM | POA: Diagnosis not present

## 2020-09-29 DIAGNOSIS — K219 Gastro-esophageal reflux disease without esophagitis: Secondary | ICD-10-CM | POA: Diagnosis not present

## 2020-09-29 DIAGNOSIS — R634 Abnormal weight loss: Secondary | ICD-10-CM | POA: Diagnosis not present

## 2020-10-03 DIAGNOSIS — E441 Mild protein-calorie malnutrition: Secondary | ICD-10-CM | POA: Diagnosis not present

## 2020-10-03 DIAGNOSIS — D649 Anemia, unspecified: Secondary | ICD-10-CM | POA: Diagnosis not present

## 2020-10-03 DIAGNOSIS — Z681 Body mass index (BMI) 19 or less, adult: Secondary | ICD-10-CM | POA: Diagnosis not present

## 2020-10-03 DIAGNOSIS — F0390 Unspecified dementia without behavioral disturbance: Secondary | ICD-10-CM | POA: Diagnosis not present

## 2020-10-03 DIAGNOSIS — I129 Hypertensive chronic kidney disease with stage 1 through stage 4 chronic kidney disease, or unspecified chronic kidney disease: Secondary | ICD-10-CM | POA: Diagnosis not present

## 2020-10-03 DIAGNOSIS — N183 Chronic kidney disease, stage 3 unspecified: Secondary | ICD-10-CM | POA: Diagnosis not present

## 2020-10-05 DIAGNOSIS — D500 Iron deficiency anemia secondary to blood loss (chronic): Secondary | ICD-10-CM | POA: Diagnosis not present

## 2020-10-05 DIAGNOSIS — F0280 Dementia in other diseases classified elsewhere without behavioral disturbance: Secondary | ICD-10-CM | POA: Diagnosis not present

## 2020-10-05 DIAGNOSIS — Z681 Body mass index (BMI) 19 or less, adult: Secondary | ICD-10-CM | POA: Diagnosis not present

## 2020-10-05 DIAGNOSIS — Z4889 Encounter for other specified surgical aftercare: Secondary | ICD-10-CM | POA: Diagnosis not present

## 2020-10-05 DIAGNOSIS — D649 Anemia, unspecified: Secondary | ICD-10-CM | POA: Diagnosis not present

## 2020-10-05 DIAGNOSIS — I129 Hypertensive chronic kidney disease with stage 1 through stage 4 chronic kidney disease, or unspecified chronic kidney disease: Secondary | ICD-10-CM | POA: Diagnosis not present

## 2020-10-05 DIAGNOSIS — I110 Hypertensive heart disease with heart failure: Secondary | ICD-10-CM | POA: Diagnosis not present

## 2020-10-05 DIAGNOSIS — I509 Heart failure, unspecified: Secondary | ICD-10-CM | POA: Diagnosis not present

## 2020-10-05 DIAGNOSIS — F0390 Unspecified dementia without behavioral disturbance: Secondary | ICD-10-CM | POA: Diagnosis not present

## 2020-10-05 DIAGNOSIS — E785 Hyperlipidemia, unspecified: Secondary | ICD-10-CM | POA: Diagnosis not present

## 2020-10-05 DIAGNOSIS — E441 Mild protein-calorie malnutrition: Secondary | ICD-10-CM | POA: Diagnosis not present

## 2020-10-05 DIAGNOSIS — E876 Hypokalemia: Secondary | ICD-10-CM | POA: Diagnosis not present

## 2020-10-05 DIAGNOSIS — E210 Primary hyperparathyroidism: Secondary | ICD-10-CM | POA: Diagnosis not present

## 2020-10-05 DIAGNOSIS — N183 Chronic kidney disease, stage 3 unspecified: Secondary | ICD-10-CM | POA: Diagnosis not present

## 2020-10-06 DIAGNOSIS — N183 Chronic kidney disease, stage 3 unspecified: Secondary | ICD-10-CM | POA: Diagnosis not present

## 2020-10-06 DIAGNOSIS — F0390 Unspecified dementia without behavioral disturbance: Secondary | ICD-10-CM | POA: Diagnosis not present

## 2020-10-06 DIAGNOSIS — D649 Anemia, unspecified: Secondary | ICD-10-CM | POA: Diagnosis not present

## 2020-10-06 DIAGNOSIS — Z681 Body mass index (BMI) 19 or less, adult: Secondary | ICD-10-CM | POA: Diagnosis not present

## 2020-10-06 DIAGNOSIS — I129 Hypertensive chronic kidney disease with stage 1 through stage 4 chronic kidney disease, or unspecified chronic kidney disease: Secondary | ICD-10-CM | POA: Diagnosis not present

## 2020-10-06 DIAGNOSIS — E441 Mild protein-calorie malnutrition: Secondary | ICD-10-CM | POA: Diagnosis not present

## 2020-10-10 DIAGNOSIS — D649 Anemia, unspecified: Secondary | ICD-10-CM | POA: Diagnosis not present

## 2020-10-10 DIAGNOSIS — E441 Mild protein-calorie malnutrition: Secondary | ICD-10-CM | POA: Diagnosis not present

## 2020-10-10 DIAGNOSIS — F0390 Unspecified dementia without behavioral disturbance: Secondary | ICD-10-CM | POA: Diagnosis not present

## 2020-10-10 DIAGNOSIS — N183 Chronic kidney disease, stage 3 unspecified: Secondary | ICD-10-CM | POA: Diagnosis not present

## 2020-10-10 DIAGNOSIS — I129 Hypertensive chronic kidney disease with stage 1 through stage 4 chronic kidney disease, or unspecified chronic kidney disease: Secondary | ICD-10-CM | POA: Diagnosis not present

## 2020-10-10 DIAGNOSIS — Z681 Body mass index (BMI) 19 or less, adult: Secondary | ICD-10-CM | POA: Diagnosis not present

## 2020-10-12 DIAGNOSIS — I129 Hypertensive chronic kidney disease with stage 1 through stage 4 chronic kidney disease, or unspecified chronic kidney disease: Secondary | ICD-10-CM | POA: Diagnosis not present

## 2020-10-12 DIAGNOSIS — E441 Mild protein-calorie malnutrition: Secondary | ICD-10-CM | POA: Diagnosis not present

## 2020-10-12 DIAGNOSIS — N183 Chronic kidney disease, stage 3 unspecified: Secondary | ICD-10-CM | POA: Diagnosis not present

## 2020-10-12 DIAGNOSIS — Z681 Body mass index (BMI) 19 or less, adult: Secondary | ICD-10-CM | POA: Diagnosis not present

## 2020-10-12 DIAGNOSIS — F0390 Unspecified dementia without behavioral disturbance: Secondary | ICD-10-CM | POA: Diagnosis not present

## 2020-10-12 DIAGNOSIS — D649 Anemia, unspecified: Secondary | ICD-10-CM | POA: Diagnosis not present

## 2020-10-19 DIAGNOSIS — N183 Chronic kidney disease, stage 3 unspecified: Secondary | ICD-10-CM | POA: Diagnosis not present

## 2020-10-19 DIAGNOSIS — Z681 Body mass index (BMI) 19 or less, adult: Secondary | ICD-10-CM | POA: Diagnosis not present

## 2020-10-19 DIAGNOSIS — F0390 Unspecified dementia without behavioral disturbance: Secondary | ICD-10-CM | POA: Diagnosis not present

## 2020-10-19 DIAGNOSIS — I129 Hypertensive chronic kidney disease with stage 1 through stage 4 chronic kidney disease, or unspecified chronic kidney disease: Secondary | ICD-10-CM | POA: Diagnosis not present

## 2020-10-19 DIAGNOSIS — E441 Mild protein-calorie malnutrition: Secondary | ICD-10-CM | POA: Diagnosis not present

## 2020-10-19 DIAGNOSIS — D649 Anemia, unspecified: Secondary | ICD-10-CM | POA: Diagnosis not present

## 2020-10-26 DIAGNOSIS — N183 Chronic kidney disease, stage 3 unspecified: Secondary | ICD-10-CM | POA: Diagnosis not present

## 2020-10-26 DIAGNOSIS — I129 Hypertensive chronic kidney disease with stage 1 through stage 4 chronic kidney disease, or unspecified chronic kidney disease: Secondary | ICD-10-CM | POA: Diagnosis not present

## 2020-10-26 DIAGNOSIS — Z86718 Personal history of other venous thrombosis and embolism: Secondary | ICD-10-CM | POA: Diagnosis not present

## 2020-10-26 DIAGNOSIS — D649 Anemia, unspecified: Secondary | ICD-10-CM | POA: Diagnosis not present

## 2020-10-26 DIAGNOSIS — M1990 Unspecified osteoarthritis, unspecified site: Secondary | ICD-10-CM | POA: Diagnosis not present

## 2020-10-26 DIAGNOSIS — F0390 Unspecified dementia without behavioral disturbance: Secondary | ICD-10-CM | POA: Diagnosis not present

## 2020-10-26 DIAGNOSIS — E441 Mild protein-calorie malnutrition: Secondary | ICD-10-CM | POA: Diagnosis not present

## 2020-10-26 DIAGNOSIS — K219 Gastro-esophageal reflux disease without esophagitis: Secondary | ICD-10-CM | POA: Diagnosis not present

## 2020-10-26 DIAGNOSIS — Z681 Body mass index (BMI) 19 or less, adult: Secondary | ICD-10-CM | POA: Diagnosis not present

## 2020-10-26 DIAGNOSIS — H9190 Unspecified hearing loss, unspecified ear: Secondary | ICD-10-CM | POA: Diagnosis not present

## 2020-11-01 DIAGNOSIS — F0390 Unspecified dementia without behavioral disturbance: Secondary | ICD-10-CM | POA: Diagnosis not present

## 2020-11-01 DIAGNOSIS — N183 Chronic kidney disease, stage 3 unspecified: Secondary | ICD-10-CM | POA: Diagnosis not present

## 2020-11-01 DIAGNOSIS — Z681 Body mass index (BMI) 19 or less, adult: Secondary | ICD-10-CM | POA: Diagnosis not present

## 2020-11-01 DIAGNOSIS — D649 Anemia, unspecified: Secondary | ICD-10-CM | POA: Diagnosis not present

## 2020-11-01 DIAGNOSIS — I129 Hypertensive chronic kidney disease with stage 1 through stage 4 chronic kidney disease, or unspecified chronic kidney disease: Secondary | ICD-10-CM | POA: Diagnosis not present

## 2020-11-01 DIAGNOSIS — E441 Mild protein-calorie malnutrition: Secondary | ICD-10-CM | POA: Diagnosis not present

## 2020-11-02 DIAGNOSIS — N183 Chronic kidney disease, stage 3 unspecified: Secondary | ICD-10-CM | POA: Diagnosis not present

## 2020-11-02 DIAGNOSIS — F0390 Unspecified dementia without behavioral disturbance: Secondary | ICD-10-CM | POA: Diagnosis not present

## 2020-11-02 DIAGNOSIS — Z681 Body mass index (BMI) 19 or less, adult: Secondary | ICD-10-CM | POA: Diagnosis not present

## 2020-11-02 DIAGNOSIS — D649 Anemia, unspecified: Secondary | ICD-10-CM | POA: Diagnosis not present

## 2020-11-02 DIAGNOSIS — E441 Mild protein-calorie malnutrition: Secondary | ICD-10-CM | POA: Diagnosis not present

## 2020-11-02 DIAGNOSIS — I129 Hypertensive chronic kidney disease with stage 1 through stage 4 chronic kidney disease, or unspecified chronic kidney disease: Secondary | ICD-10-CM | POA: Diagnosis not present

## 2020-11-09 DIAGNOSIS — N183 Chronic kidney disease, stage 3 unspecified: Secondary | ICD-10-CM | POA: Diagnosis not present

## 2020-11-09 DIAGNOSIS — I129 Hypertensive chronic kidney disease with stage 1 through stage 4 chronic kidney disease, or unspecified chronic kidney disease: Secondary | ICD-10-CM | POA: Diagnosis not present

## 2020-11-09 DIAGNOSIS — F0390 Unspecified dementia without behavioral disturbance: Secondary | ICD-10-CM | POA: Diagnosis not present

## 2020-11-09 DIAGNOSIS — E441 Mild protein-calorie malnutrition: Secondary | ICD-10-CM | POA: Diagnosis not present

## 2020-11-09 DIAGNOSIS — Z681 Body mass index (BMI) 19 or less, adult: Secondary | ICD-10-CM | POA: Diagnosis not present

## 2020-11-09 DIAGNOSIS — D649 Anemia, unspecified: Secondary | ICD-10-CM | POA: Diagnosis not present

## 2020-11-17 DIAGNOSIS — Z681 Body mass index (BMI) 19 or less, adult: Secondary | ICD-10-CM | POA: Diagnosis not present

## 2020-11-17 DIAGNOSIS — N183 Chronic kidney disease, stage 3 unspecified: Secondary | ICD-10-CM | POA: Diagnosis not present

## 2020-11-17 DIAGNOSIS — F0390 Unspecified dementia without behavioral disturbance: Secondary | ICD-10-CM | POA: Diagnosis not present

## 2020-11-17 DIAGNOSIS — E441 Mild protein-calorie malnutrition: Secondary | ICD-10-CM | POA: Diagnosis not present

## 2020-11-17 DIAGNOSIS — I129 Hypertensive chronic kidney disease with stage 1 through stage 4 chronic kidney disease, or unspecified chronic kidney disease: Secondary | ICD-10-CM | POA: Diagnosis not present

## 2020-11-17 DIAGNOSIS — D649 Anemia, unspecified: Secondary | ICD-10-CM | POA: Diagnosis not present

## 2020-11-24 DIAGNOSIS — D649 Anemia, unspecified: Secondary | ICD-10-CM | POA: Diagnosis not present

## 2020-11-24 DIAGNOSIS — I129 Hypertensive chronic kidney disease with stage 1 through stage 4 chronic kidney disease, or unspecified chronic kidney disease: Secondary | ICD-10-CM | POA: Diagnosis not present

## 2020-11-24 DIAGNOSIS — Z681 Body mass index (BMI) 19 or less, adult: Secondary | ICD-10-CM | POA: Diagnosis not present

## 2020-11-24 DIAGNOSIS — N183 Chronic kidney disease, stage 3 unspecified: Secondary | ICD-10-CM | POA: Diagnosis not present

## 2020-11-24 DIAGNOSIS — E441 Mild protein-calorie malnutrition: Secondary | ICD-10-CM | POA: Diagnosis not present

## 2020-11-24 DIAGNOSIS — F0390 Unspecified dementia without behavioral disturbance: Secondary | ICD-10-CM | POA: Diagnosis not present

## 2020-11-26 DIAGNOSIS — F0390 Unspecified dementia without behavioral disturbance: Secondary | ICD-10-CM | POA: Diagnosis not present

## 2020-11-26 DIAGNOSIS — E441 Mild protein-calorie malnutrition: Secondary | ICD-10-CM | POA: Diagnosis not present

## 2020-11-26 DIAGNOSIS — H9190 Unspecified hearing loss, unspecified ear: Secondary | ICD-10-CM | POA: Diagnosis not present

## 2020-11-26 DIAGNOSIS — K219 Gastro-esophageal reflux disease without esophagitis: Secondary | ICD-10-CM | POA: Diagnosis not present

## 2020-11-26 DIAGNOSIS — M1990 Unspecified osteoarthritis, unspecified site: Secondary | ICD-10-CM | POA: Diagnosis not present

## 2020-11-26 DIAGNOSIS — D649 Anemia, unspecified: Secondary | ICD-10-CM | POA: Diagnosis not present

## 2020-11-26 DIAGNOSIS — I129 Hypertensive chronic kidney disease with stage 1 through stage 4 chronic kidney disease, or unspecified chronic kidney disease: Secondary | ICD-10-CM | POA: Diagnosis not present

## 2020-11-26 DIAGNOSIS — N183 Chronic kidney disease, stage 3 unspecified: Secondary | ICD-10-CM | POA: Diagnosis not present

## 2020-11-26 DIAGNOSIS — Z681 Body mass index (BMI) 19 or less, adult: Secondary | ICD-10-CM | POA: Diagnosis not present

## 2020-11-26 DIAGNOSIS — Z86718 Personal history of other venous thrombosis and embolism: Secondary | ICD-10-CM | POA: Diagnosis not present

## 2020-11-29 DIAGNOSIS — N183 Chronic kidney disease, stage 3 unspecified: Secondary | ICD-10-CM | POA: Diagnosis not present

## 2020-11-29 DIAGNOSIS — I129 Hypertensive chronic kidney disease with stage 1 through stage 4 chronic kidney disease, or unspecified chronic kidney disease: Secondary | ICD-10-CM | POA: Diagnosis not present

## 2020-11-29 DIAGNOSIS — F0390 Unspecified dementia without behavioral disturbance: Secondary | ICD-10-CM | POA: Diagnosis not present

## 2020-11-29 DIAGNOSIS — D649 Anemia, unspecified: Secondary | ICD-10-CM | POA: Diagnosis not present

## 2020-11-29 DIAGNOSIS — E441 Mild protein-calorie malnutrition: Secondary | ICD-10-CM | POA: Diagnosis not present

## 2020-11-29 DIAGNOSIS — Z681 Body mass index (BMI) 19 or less, adult: Secondary | ICD-10-CM | POA: Diagnosis not present

## 2020-12-01 DIAGNOSIS — E441 Mild protein-calorie malnutrition: Secondary | ICD-10-CM | POA: Diagnosis not present

## 2020-12-01 DIAGNOSIS — Z681 Body mass index (BMI) 19 or less, adult: Secondary | ICD-10-CM | POA: Diagnosis not present

## 2020-12-01 DIAGNOSIS — I129 Hypertensive chronic kidney disease with stage 1 through stage 4 chronic kidney disease, or unspecified chronic kidney disease: Secondary | ICD-10-CM | POA: Diagnosis not present

## 2020-12-01 DIAGNOSIS — N183 Chronic kidney disease, stage 3 unspecified: Secondary | ICD-10-CM | POA: Diagnosis not present

## 2020-12-01 DIAGNOSIS — F0390 Unspecified dementia without behavioral disturbance: Secondary | ICD-10-CM | POA: Diagnosis not present

## 2020-12-01 DIAGNOSIS — D649 Anemia, unspecified: Secondary | ICD-10-CM | POA: Diagnosis not present

## 2020-12-07 DIAGNOSIS — F0390 Unspecified dementia without behavioral disturbance: Secondary | ICD-10-CM | POA: Diagnosis not present

## 2020-12-07 DIAGNOSIS — I129 Hypertensive chronic kidney disease with stage 1 through stage 4 chronic kidney disease, or unspecified chronic kidney disease: Secondary | ICD-10-CM | POA: Diagnosis not present

## 2020-12-07 DIAGNOSIS — Z681 Body mass index (BMI) 19 or less, adult: Secondary | ICD-10-CM | POA: Diagnosis not present

## 2020-12-07 DIAGNOSIS — D649 Anemia, unspecified: Secondary | ICD-10-CM | POA: Diagnosis not present

## 2020-12-07 DIAGNOSIS — E441 Mild protein-calorie malnutrition: Secondary | ICD-10-CM | POA: Diagnosis not present

## 2020-12-07 DIAGNOSIS — N183 Chronic kidney disease, stage 3 unspecified: Secondary | ICD-10-CM | POA: Diagnosis not present

## 2020-12-14 DIAGNOSIS — N183 Chronic kidney disease, stage 3 unspecified: Secondary | ICD-10-CM | POA: Diagnosis not present

## 2020-12-14 DIAGNOSIS — I129 Hypertensive chronic kidney disease with stage 1 through stage 4 chronic kidney disease, or unspecified chronic kidney disease: Secondary | ICD-10-CM | POA: Diagnosis not present

## 2020-12-14 DIAGNOSIS — E441 Mild protein-calorie malnutrition: Secondary | ICD-10-CM | POA: Diagnosis not present

## 2020-12-14 DIAGNOSIS — Z681 Body mass index (BMI) 19 or less, adult: Secondary | ICD-10-CM | POA: Diagnosis not present

## 2020-12-14 DIAGNOSIS — F0390 Unspecified dementia without behavioral disturbance: Secondary | ICD-10-CM | POA: Diagnosis not present

## 2020-12-14 DIAGNOSIS — D649 Anemia, unspecified: Secondary | ICD-10-CM | POA: Diagnosis not present

## 2020-12-15 DIAGNOSIS — N183 Chronic kidney disease, stage 3 unspecified: Secondary | ICD-10-CM | POA: Diagnosis not present

## 2020-12-15 DIAGNOSIS — F0390 Unspecified dementia without behavioral disturbance: Secondary | ICD-10-CM | POA: Diagnosis not present

## 2020-12-15 DIAGNOSIS — Z681 Body mass index (BMI) 19 or less, adult: Secondary | ICD-10-CM | POA: Diagnosis not present

## 2020-12-15 DIAGNOSIS — E441 Mild protein-calorie malnutrition: Secondary | ICD-10-CM | POA: Diagnosis not present

## 2020-12-15 DIAGNOSIS — D649 Anemia, unspecified: Secondary | ICD-10-CM | POA: Diagnosis not present

## 2020-12-15 DIAGNOSIS — I129 Hypertensive chronic kidney disease with stage 1 through stage 4 chronic kidney disease, or unspecified chronic kidney disease: Secondary | ICD-10-CM | POA: Diagnosis not present

## 2020-12-21 DIAGNOSIS — E441 Mild protein-calorie malnutrition: Secondary | ICD-10-CM | POA: Diagnosis not present

## 2020-12-21 DIAGNOSIS — N183 Chronic kidney disease, stage 3 unspecified: Secondary | ICD-10-CM | POA: Diagnosis not present

## 2020-12-21 DIAGNOSIS — I129 Hypertensive chronic kidney disease with stage 1 through stage 4 chronic kidney disease, or unspecified chronic kidney disease: Secondary | ICD-10-CM | POA: Diagnosis not present

## 2020-12-21 DIAGNOSIS — D649 Anemia, unspecified: Secondary | ICD-10-CM | POA: Diagnosis not present

## 2020-12-21 DIAGNOSIS — Z681 Body mass index (BMI) 19 or less, adult: Secondary | ICD-10-CM | POA: Diagnosis not present

## 2020-12-21 DIAGNOSIS — F0390 Unspecified dementia without behavioral disturbance: Secondary | ICD-10-CM | POA: Diagnosis not present

## 2020-12-27 DIAGNOSIS — E441 Mild protein-calorie malnutrition: Secondary | ICD-10-CM | POA: Diagnosis not present

## 2020-12-27 DIAGNOSIS — Z86718 Personal history of other venous thrombosis and embolism: Secondary | ICD-10-CM | POA: Diagnosis not present

## 2020-12-27 DIAGNOSIS — D649 Anemia, unspecified: Secondary | ICD-10-CM | POA: Diagnosis not present

## 2020-12-27 DIAGNOSIS — Z681 Body mass index (BMI) 19 or less, adult: Secondary | ICD-10-CM | POA: Diagnosis not present

## 2020-12-27 DIAGNOSIS — M1990 Unspecified osteoarthritis, unspecified site: Secondary | ICD-10-CM | POA: Diagnosis not present

## 2020-12-27 DIAGNOSIS — F0390 Unspecified dementia without behavioral disturbance: Secondary | ICD-10-CM | POA: Diagnosis not present

## 2020-12-27 DIAGNOSIS — N183 Chronic kidney disease, stage 3 unspecified: Secondary | ICD-10-CM | POA: Diagnosis not present

## 2020-12-27 DIAGNOSIS — I129 Hypertensive chronic kidney disease with stage 1 through stage 4 chronic kidney disease, or unspecified chronic kidney disease: Secondary | ICD-10-CM | POA: Diagnosis not present

## 2020-12-27 DIAGNOSIS — H9190 Unspecified hearing loss, unspecified ear: Secondary | ICD-10-CM | POA: Diagnosis not present

## 2020-12-27 DIAGNOSIS — K219 Gastro-esophageal reflux disease without esophagitis: Secondary | ICD-10-CM | POA: Diagnosis not present

## 2020-12-28 DIAGNOSIS — N183 Chronic kidney disease, stage 3 unspecified: Secondary | ICD-10-CM | POA: Diagnosis not present

## 2020-12-28 DIAGNOSIS — Z681 Body mass index (BMI) 19 or less, adult: Secondary | ICD-10-CM | POA: Diagnosis not present

## 2020-12-28 DIAGNOSIS — E441 Mild protein-calorie malnutrition: Secondary | ICD-10-CM | POA: Diagnosis not present

## 2020-12-28 DIAGNOSIS — I129 Hypertensive chronic kidney disease with stage 1 through stage 4 chronic kidney disease, or unspecified chronic kidney disease: Secondary | ICD-10-CM | POA: Diagnosis not present

## 2020-12-28 DIAGNOSIS — D649 Anemia, unspecified: Secondary | ICD-10-CM | POA: Diagnosis not present

## 2020-12-28 DIAGNOSIS — F0390 Unspecified dementia without behavioral disturbance: Secondary | ICD-10-CM | POA: Diagnosis not present

## 2021-01-04 DIAGNOSIS — F0390 Unspecified dementia without behavioral disturbance: Secondary | ICD-10-CM | POA: Diagnosis not present

## 2021-01-04 DIAGNOSIS — E441 Mild protein-calorie malnutrition: Secondary | ICD-10-CM | POA: Diagnosis not present

## 2021-01-04 DIAGNOSIS — D649 Anemia, unspecified: Secondary | ICD-10-CM | POA: Diagnosis not present

## 2021-01-04 DIAGNOSIS — Z681 Body mass index (BMI) 19 or less, adult: Secondary | ICD-10-CM | POA: Diagnosis not present

## 2021-01-04 DIAGNOSIS — I129 Hypertensive chronic kidney disease with stage 1 through stage 4 chronic kidney disease, or unspecified chronic kidney disease: Secondary | ICD-10-CM | POA: Diagnosis not present

## 2021-01-04 DIAGNOSIS — N183 Chronic kidney disease, stage 3 unspecified: Secondary | ICD-10-CM | POA: Diagnosis not present

## 2021-01-11 DIAGNOSIS — E441 Mild protein-calorie malnutrition: Secondary | ICD-10-CM | POA: Diagnosis not present

## 2021-01-11 DIAGNOSIS — N183 Chronic kidney disease, stage 3 unspecified: Secondary | ICD-10-CM | POA: Diagnosis not present

## 2021-01-11 DIAGNOSIS — I129 Hypertensive chronic kidney disease with stage 1 through stage 4 chronic kidney disease, or unspecified chronic kidney disease: Secondary | ICD-10-CM | POA: Diagnosis not present

## 2021-01-11 DIAGNOSIS — Z681 Body mass index (BMI) 19 or less, adult: Secondary | ICD-10-CM | POA: Diagnosis not present

## 2021-01-11 DIAGNOSIS — F0390 Unspecified dementia without behavioral disturbance: Secondary | ICD-10-CM | POA: Diagnosis not present

## 2021-01-11 DIAGNOSIS — D649 Anemia, unspecified: Secondary | ICD-10-CM | POA: Diagnosis not present

## 2021-01-12 DIAGNOSIS — D649 Anemia, unspecified: Secondary | ICD-10-CM | POA: Diagnosis not present

## 2021-01-12 DIAGNOSIS — N183 Chronic kidney disease, stage 3 unspecified: Secondary | ICD-10-CM | POA: Diagnosis not present

## 2021-01-12 DIAGNOSIS — F0390 Unspecified dementia without behavioral disturbance: Secondary | ICD-10-CM | POA: Diagnosis not present

## 2021-01-12 DIAGNOSIS — E441 Mild protein-calorie malnutrition: Secondary | ICD-10-CM | POA: Diagnosis not present

## 2021-01-12 DIAGNOSIS — Z681 Body mass index (BMI) 19 or less, adult: Secondary | ICD-10-CM | POA: Diagnosis not present

## 2021-01-12 DIAGNOSIS — I129 Hypertensive chronic kidney disease with stage 1 through stage 4 chronic kidney disease, or unspecified chronic kidney disease: Secondary | ICD-10-CM | POA: Diagnosis not present

## 2021-01-18 DIAGNOSIS — Z681 Body mass index (BMI) 19 or less, adult: Secondary | ICD-10-CM | POA: Diagnosis not present

## 2021-01-18 DIAGNOSIS — E441 Mild protein-calorie malnutrition: Secondary | ICD-10-CM | POA: Diagnosis not present

## 2021-01-18 DIAGNOSIS — N183 Chronic kidney disease, stage 3 unspecified: Secondary | ICD-10-CM | POA: Diagnosis not present

## 2021-01-18 DIAGNOSIS — D649 Anemia, unspecified: Secondary | ICD-10-CM | POA: Diagnosis not present

## 2021-01-18 DIAGNOSIS — I129 Hypertensive chronic kidney disease with stage 1 through stage 4 chronic kidney disease, or unspecified chronic kidney disease: Secondary | ICD-10-CM | POA: Diagnosis not present

## 2021-01-18 DIAGNOSIS — F0390 Unspecified dementia without behavioral disturbance: Secondary | ICD-10-CM | POA: Diagnosis not present

## 2021-02-19 ENCOUNTER — Other Ambulatory Visit: Payer: Self-pay | Admitting: Family Medicine

## 2021-02-20 NOTE — Telephone Encounter (Signed)
Spoke with pt daughter regarding pt PCP, state that pt is under hospice and is seen by the hospice doctor, pt daughter also state that he nolonger takes Xarelto. Note sent to the pharmacy

## 2021-03-26 DEATH — deceased
